# Patient Record
Sex: Male | Born: 1965 | Race: White | Hispanic: No | Marital: Married | Smoking: Never smoker
Health system: Southern US, Community
[De-identification: ages and names within clinical notes are randomized; demographics above are authoritative.]

## PROBLEM LIST (undated history)

## (undated) DIAGNOSIS — E8881 Metabolic syndrome: Secondary | ICD-10-CM

## (undated) DIAGNOSIS — E785 Hyperlipidemia, unspecified: Secondary | ICD-10-CM

## (undated) DIAGNOSIS — M7050 Other bursitis of knee, unspecified knee: Secondary | ICD-10-CM

## (undated) DIAGNOSIS — I10 Essential (primary) hypertension: Secondary | ICD-10-CM

## (undated) HISTORY — DX: Metabolic syndrome: E88.81

## (undated) HISTORY — DX: Hyperlipidemia, unspecified: E78.5

## (undated) HISTORY — PX: APPENDECTOMY: SHX54

## (undated) HISTORY — DX: Essential (primary) hypertension: I10

## (undated) HISTORY — DX: Metabolic syndrome: E88.810

## (undated) HISTORY — DX: Other bursitis of knee, unspecified knee: M70.50

---

## 2006-06-11 ENCOUNTER — Ambulatory Visit: Payer: Self-pay | Admitting: Family Medicine

## 2006-06-15 LAB — CONVERTED CEMR LAB
Cholesterol: 232 mg/dL
LDL Cholesterol: 152 mg/dL

## 2006-06-30 ENCOUNTER — Ambulatory Visit: Payer: Self-pay | Admitting: Family Medicine

## 2006-07-21 ENCOUNTER — Ambulatory Visit: Payer: Self-pay | Admitting: Family Medicine

## 2006-08-25 ENCOUNTER — Ambulatory Visit: Payer: Self-pay | Admitting: Family Medicine

## 2006-11-22 ENCOUNTER — Ambulatory Visit: Payer: Self-pay | Admitting: Family Medicine

## 2006-11-22 DIAGNOSIS — I10 Essential (primary) hypertension: Secondary | ICD-10-CM | POA: Insufficient documentation

## 2006-12-08 ENCOUNTER — Ambulatory Visit: Payer: Self-pay | Admitting: Family Medicine

## 2006-12-08 DIAGNOSIS — L919 Hypertrophic disorder of the skin, unspecified: Secondary | ICD-10-CM

## 2006-12-08 DIAGNOSIS — L909 Atrophic disorder of skin, unspecified: Secondary | ICD-10-CM | POA: Insufficient documentation

## 2006-12-08 DIAGNOSIS — Z6835 Body mass index (BMI) 35.0-35.9, adult: Secondary | ICD-10-CM

## 2006-12-08 DIAGNOSIS — E785 Hyperlipidemia, unspecified: Secondary | ICD-10-CM | POA: Insufficient documentation

## 2006-12-08 LAB — CONVERTED CEMR LAB: Cholesterol, target level: 200 mg/dL

## 2006-12-28 ENCOUNTER — Telehealth: Payer: Self-pay | Admitting: Family Medicine

## 2007-01-13 ENCOUNTER — Telehealth: Payer: Self-pay | Admitting: Family Medicine

## 2007-03-07 ENCOUNTER — Ambulatory Visit: Payer: Self-pay | Admitting: Family Medicine

## 2007-03-22 ENCOUNTER — Telehealth: Payer: Self-pay | Admitting: Family Medicine

## 2007-03-28 ENCOUNTER — Encounter: Payer: Self-pay | Admitting: Family Medicine

## 2007-03-30 ENCOUNTER — Encounter: Payer: Self-pay | Admitting: Family Medicine

## 2007-04-04 ENCOUNTER — Ambulatory Visit: Payer: Self-pay | Admitting: Family Medicine

## 2007-04-04 LAB — CONVERTED CEMR LAB
CO2: 26 meq/L (ref 19–32)
Calcium: 9.2 mg/dL (ref 8.4–10.5)
Chloride: 106 meq/L (ref 96–112)
Glucose, Bld: 86 mg/dL (ref 70–99)
HDL: 32 mg/dL — ABNORMAL LOW (ref >39)
LDL Cholesterol: 164 mg/dL — ABNORMAL HIGH (ref 0–99)
Potassium: 4 meq/L (ref 3.5–5.3)
Sodium: 143 meq/L (ref 135–145)
Total CHOL/HDL Ratio: 6.9
VLDL: 26 mg/dL (ref 0–40)

## 2007-06-03 ENCOUNTER — Ambulatory Visit: Payer: Self-pay | Admitting: Family Medicine

## 2007-08-04 ENCOUNTER — Ambulatory Visit: Payer: Self-pay | Admitting: Family Medicine

## 2007-08-04 DIAGNOSIS — E8881 Metabolic syndrome: Secondary | ICD-10-CM | POA: Insufficient documentation

## 2007-08-04 LAB — CONVERTED CEMR LAB: LDL Goal: 130 mg/dL

## 2007-11-02 ENCOUNTER — Ambulatory Visit: Payer: Self-pay | Admitting: Family Medicine

## 2008-03-27 ENCOUNTER — Ambulatory Visit: Payer: Self-pay | Admitting: Family Medicine

## 2008-03-28 ENCOUNTER — Encounter: Payer: Self-pay | Admitting: Family Medicine

## 2008-03-28 LAB — CONVERTED CEMR LAB
ALT: 28 units/L (ref 0–53)
Alkaline Phosphatase: 100 units/L (ref 39–117)
CO2: 25 meq/L (ref 19–32)
Creatinine, Ser: 0.83 mg/dL (ref 0.40–1.50)
HDL: 30 mg/dL — ABNORMAL LOW (ref >39)
Sodium: 142 meq/L (ref 135–145)
Total Bilirubin: 0.7 mg/dL (ref 0.3–1.2)
Total Protein: 7 g/dL (ref 6.0–8.3)

## 2008-04-10 ENCOUNTER — Telehealth: Payer: Self-pay | Admitting: Family Medicine

## 2008-09-25 ENCOUNTER — Ambulatory Visit: Payer: Self-pay | Admitting: Family Medicine

## 2008-09-25 DIAGNOSIS — R5383 Other fatigue: Secondary | ICD-10-CM

## 2008-09-25 DIAGNOSIS — R5381 Other malaise: Secondary | ICD-10-CM | POA: Insufficient documentation

## 2008-09-26 ENCOUNTER — Encounter: Payer: Self-pay | Admitting: Family Medicine

## 2008-09-27 ENCOUNTER — Encounter: Payer: Self-pay | Admitting: Family Medicine

## 2008-09-27 LAB — CONVERTED CEMR LAB
HCT: 45.1 % (ref 39.0–52.0)
Hemoglobin: 14.8 g/dL (ref 13.0–17.0)
MCV: 88.1 fL (ref 78.0–100.0)
Platelets: 235 10*3/uL (ref 150–400)
WBC: 8.9 10*3/uL (ref 4.0–10.5)

## 2008-09-28 ENCOUNTER — Encounter: Payer: Self-pay | Admitting: Family Medicine

## 2008-09-28 ENCOUNTER — Telehealth (INDEPENDENT_AMBULATORY_CARE_PROVIDER_SITE_OTHER): Payer: Self-pay | Admitting: *Deleted

## 2008-10-03 LAB — CONVERTED CEMR LAB
Iron: 103 ug/dL (ref 42–165)
Saturation Ratios: 32 % (ref 20–55)
TIBC: 321 ug/dL (ref 215–435)
UIBC: 218 ug/dL

## 2008-12-27 ENCOUNTER — Encounter: Admission: RE | Admit: 2008-12-27 | Discharge: 2008-12-27 | Payer: Self-pay | Admitting: Family Medicine

## 2008-12-27 ENCOUNTER — Ambulatory Visit: Payer: Self-pay | Admitting: Family Medicine

## 2008-12-27 DIAGNOSIS — M170 Bilateral primary osteoarthritis of knee: Secondary | ICD-10-CM | POA: Insufficient documentation

## 2008-12-27 LAB — CONVERTED CEMR LAB
ALT: 37 units/L (ref 0–53)
AST: 23 units/L (ref 0–37)
Glucose, Bld: 96 mg/dL (ref 70–99)
HDL: 32 mg/dL — ABNORMAL LOW (ref >39)
Sed Rate: 6 mm/hr (ref 0–16)
Uric Acid, Serum: 8.3 mg/dL — ABNORMAL HIGH (ref 4.0–7.8)

## 2008-12-28 ENCOUNTER — Encounter: Payer: Self-pay | Admitting: Family Medicine

## 2008-12-31 ENCOUNTER — Ambulatory Visit: Payer: Self-pay | Admitting: Family Medicine

## 2008-12-31 DIAGNOSIS — IMO0002 Reserved for concepts with insufficient information to code with codable children: Secondary | ICD-10-CM | POA: Insufficient documentation

## 2009-01-03 ENCOUNTER — Ambulatory Visit: Payer: Self-pay | Admitting: Family Medicine

## 2009-01-03 DIAGNOSIS — K5289 Other specified noninfective gastroenteritis and colitis: Secondary | ICD-10-CM | POA: Insufficient documentation

## 2009-05-03 ENCOUNTER — Ambulatory Visit: Payer: Self-pay | Admitting: Family Medicine

## 2009-05-03 DIAGNOSIS — H00019 Hordeolum externum unspecified eye, unspecified eyelid: Secondary | ICD-10-CM | POA: Insufficient documentation

## 2009-05-03 DIAGNOSIS — R609 Edema, unspecified: Secondary | ICD-10-CM | POA: Insufficient documentation

## 2009-06-17 ENCOUNTER — Telehealth (INDEPENDENT_AMBULATORY_CARE_PROVIDER_SITE_OTHER): Payer: Self-pay | Admitting: *Deleted

## 2009-06-21 ENCOUNTER — Ambulatory Visit: Payer: Self-pay | Admitting: Family Medicine

## 2009-06-25 LAB — CONVERTED CEMR LAB
ALT: 35 units/L (ref 0–53)
AST: 24 units/L (ref 0–37)
Alkaline Phosphatase: 110 units/L (ref 39–117)
Creatinine, Ser: 0.92 mg/dL (ref 0.40–1.50)
Sodium: 142 meq/L (ref 135–145)
Total Bilirubin: 0.7 mg/dL (ref 0.3–1.2)
Total Protein: 7.1 g/dL (ref 6.0–8.3)

## 2009-07-19 ENCOUNTER — Telehealth (INDEPENDENT_AMBULATORY_CARE_PROVIDER_SITE_OTHER): Payer: Self-pay | Admitting: *Deleted

## 2009-09-20 ENCOUNTER — Ambulatory Visit: Payer: Self-pay | Admitting: Family Medicine

## 2009-12-26 ENCOUNTER — Ambulatory Visit: Payer: Self-pay | Admitting: Family Medicine

## 2010-01-31 ENCOUNTER — Ambulatory Visit: Payer: Self-pay | Admitting: Family Medicine

## 2010-01-31 DIAGNOSIS — M7710 Lateral epicondylitis, unspecified elbow: Secondary | ICD-10-CM | POA: Insufficient documentation

## 2010-06-27 ENCOUNTER — Ambulatory Visit: Payer: Self-pay | Admitting: Family Medicine

## 2010-06-30 DIAGNOSIS — E538 Deficiency of other specified B group vitamins: Secondary | ICD-10-CM | POA: Insufficient documentation

## 2010-06-30 LAB — CONVERTED CEMR LAB
CO2: 27 meq/L (ref 19–32)
Calcium: 9 mg/dL (ref 8.4–10.5)
Chloride: 105 meq/L (ref 96–112)
Cholesterol: 160 mg/dL (ref 0–200)
Creatinine, Ser: 0.88 mg/dL (ref 0.40–1.50)
Glucose, Bld: 98 mg/dL (ref 70–99)
PSA: 0.76 ng/mL (ref 0.10–4.00)
Total Bilirubin: 0.8 mg/dL (ref 0.3–1.2)
Total CHOL/HDL Ratio: 5
Triglycerides: 180 mg/dL — ABNORMAL HIGH (ref <150)
VLDL: 36 mg/dL (ref 0–40)
Vitamin B-12: 276 pg/mL (ref 211–911)

## 2010-07-01 ENCOUNTER — Ambulatory Visit: Payer: Self-pay | Admitting: Family Medicine

## 2010-07-14 ENCOUNTER — Telehealth: Payer: Self-pay | Admitting: Family Medicine

## 2010-08-01 ENCOUNTER — Ambulatory Visit: Payer: Self-pay | Admitting: Family Medicine

## 2010-09-05 ENCOUNTER — Ambulatory Visit: Payer: Self-pay | Admitting: Family Medicine

## 2010-09-18 ENCOUNTER — Telehealth (INDEPENDENT_AMBULATORY_CARE_PROVIDER_SITE_OTHER): Payer: Self-pay | Admitting: *Deleted

## 2010-09-25 ENCOUNTER — Encounter: Payer: Self-pay | Admitting: Family Medicine

## 2010-09-25 ENCOUNTER — Encounter: Admission: RE | Admit: 2010-09-25 | Discharge: 2010-09-25 | Payer: Self-pay | Admitting: Orthopedic Surgery

## 2010-10-10 ENCOUNTER — Ambulatory Visit: Payer: Self-pay | Admitting: Family Medicine

## 2010-10-24 ENCOUNTER — Ambulatory Visit: Payer: Self-pay | Admitting: Family Medicine

## 2010-11-05 ENCOUNTER — Telehealth: Payer: Self-pay | Admitting: Family Medicine

## 2010-11-07 ENCOUNTER — Ambulatory Visit: Payer: Self-pay | Admitting: Family Medicine

## 2010-12-12 ENCOUNTER — Ambulatory Visit: Payer: Self-pay | Admitting: Family Medicine

## 2011-01-20 NOTE — Assessment & Plan Note (Signed)
Summary: B12 shot  Nurse Visit   Allergies: 1)  ! Pcn  Medication Administration  Injection # 1:    Medication: Vit B12 1000 mcg    Diagnosis: B12 DEFICIENCY (ICD-266.2)    Route: IM    Site: R deltoid    Exp Date: 04/20/2012    Lot #: [id number]    Mfr: [employer]    Patient tolerated injection without complications    Given by: Kathlene November LPN (October 10, 2010 8:35 AM)  Orders Added: 1)  Vit B12 1000 mcg [J3420] 2)  Admin of Therapeutic Inj  intramuscular or subcutaneous [[id number]]

## 2011-01-20 NOTE — Consult Note (Signed)
Summary: [facility]   Imported By: Lanelle Bal 10/08/2010 11:38:35  _____________________________________________________________________  External Attachment:    Type:   Image     Comment:   External Document

## 2011-01-20 NOTE — Assessment & Plan Note (Signed)
Summary: b12 injection-jr  Nurse Visit   Vitals Entered By: Payton Spark CMA (August 01, 2010 9:08 AM)  Allergies: 1)  ! Pcn  Medication Administration  Injection # 1:    Medication: Vit B12 1000 mcg    Diagnosis: B12 DEFICIENCY (ICD-266.2)    Route: IM    Site: LUOQ gluteus    Exp Date: 04/13    Lot #: [id number]    Patient tolerated injection without complications    Given by: Payton Spark CMA (August 01, 2010 9:09 AM)  Orders Added: 1)  Vit B12 1000 mcg [J3420] 2)  Admin of Therapeutic Inj  intramuscular or subcutaneous [96372]   Medication Administration  Injection # 1:    Medication: Vit B12 1000 mcg    Diagnosis: B12 DEFICIENCY (ICD-266.2)    Route: IM    Site: LUOQ gluteus    Exp Date: 04/13    Lot #: [id number]    Patient tolerated injection without complications    Given by: Payton Spark CMA (August 01, 2010 9:09 AM)  Orders Added: 1)  Vit B12 1000 mcg [J3420] 2)  Admin of Therapeutic Inj  intramuscular or subcutaneous [[id number]]

## 2011-01-20 NOTE — Assessment & Plan Note (Signed)
Summary: tennis elbow   Vital Signs:  Patient profile:   45 year old male Height:      70.5 inches Weight:      258 pounds BMI:     36.63 O2 Sat:      98 % on Room air Temp:     98.5 degrees F oral Pulse rate:   76 / minute BP sitting:   160 / 88  (right arm)  Vitals Entered By: Payton Spark CMA (January 31, 2010 9:39 AM)  O2 Flow:  Room air CC: R elbow pain x 1.5 weeks. Worse when cold.    Primary Care Provider:  Seymour Bars D.O.  CC:  R elbow pain x 1.5 weeks. Worse when cold. Brandon Mckee  History of Present Illness: 45 yo HM presents for pain in both elbows.  He had pain in the L side since October that is unchanged.  The R elbow started hurting over a wk ago.  It has been acute pain that is more severe.  No swelling or redness.  He had not been doing much work before this started.  Pain was worse with cold weather.  He took some of his pain pills which helped him sleep.  He is not taking anything OTC.  No other joint pains.  The pain is not radiating.  Worse w/ straightening.  Current Medications (verified): 1)  Aspir-Low 81 Mg Tbec (Aspirin) .... Once Daily By Mouth 2)  Pravastatin Sodium 40 Mg Tabs (Pravastatin Sodium) .... 2 Tabs By Mouth Qhs 3)  Lisinopril-Hydrochlorothiazide 20-25 Mg Tabs (Lisinopril-Hydrochlorothiazide) .Brandon Mckee.. 1 Tab By Mouth Qd 4)  Proair Hfa 108 (90 Base) Mcg/act Aers (Albuterol Sulfate) .Brandon Mckee.. 1-2 Puffs Q4-6 Hrs Prn 5)  Carvedilol 25 Mg Tabs (Carvedilol) .Brandon Mckee.. 1 Tab By Mouth Bid 6)  Niacin Cr 500 Mg  Cpcr (Niacin) .Brandon Mckee.. 1-4 Tabs By Mouth Daily  Allergies (verified): 1)  ! Pcn  Review of Systems      See HPI  Physical Exam  General:  alert, well-developed, well-nourished, and well-hydrated.  obese in NAD Lungs:  Normal respiratory effort, chest expands symmetrically. Lungs are clear to auscultation, no crackles or wheezes. Heart:  Normal rate and regular rhythm. S1 and S2 normal without gallop, murmur, click, rub or other extra sounds. Msk:  no joint  effusions tender at the insertion of muscle tendon over both lateral epicodyles.  Full elbow ROM bilat with R>L pain at full extension.  No redness, bruising or edema noted.   Pulses:  2+ radial pulses Extremities:  no UE or LE edema Skin:  color normal.     Impression & Recommendations:  Problem # 1:  LATERAL EPICONDYLITIS, BILATERAL (ICD-726.32) Exam c/w chronic L lateral epicondylitis and an acute R lateral epicondylitis likely due to repetitive motion (lays floors). Treat with RX NSAIDS, Cho Pat strapping, ice and relative rest x 2 wks.  Vicodin given for severe pain. If not improved in 2 wks will send to ortho to see if injection is needed.  Explained risk for tendon rupture.  Problem # 2:  HYPERTENSION, BENIGN (ICD-401.1) BP high today likely due to pain.  Recheck at f/u visit to ensure it comes back down. His updated medication list for this problem includes:    Lisinopril-hydrochlorothiazide 20-25 Mg Tabs (Lisinopril-hydrochlorothiazide) .Brandon Mckee... 1 tab by mouth qd    Carvedilol 25 Mg Tabs (Carvedilol) .Brandon Mckee... 1 tab by mouth bid  BP today: 160/88 Prior BP: 140/80 (12/26/2009)  Prior 10 Yr Risk Heart Disease: 9 % (03/27/2008)  Labs Reviewed: K+: 3.9 (06/21/2009) Creat: : 0.92 (06/21/2009)   Chol: 177 (12/27/2008)   HDL: 32 (12/27/2008)   LDL: 116 (12/27/2008)   TG: 144 (12/27/2008)  Complete Medication List: 1)  Aspir-low 81 Mg Tbec (Aspirin) .... Once daily by mouth 2)  Pravastatin Sodium 40 Mg Tabs (Pravastatin sodium) .... 2 tabs by mouth qhs 3)  Lisinopril-hydrochlorothiazide 20-25 Mg Tabs (Lisinopril-hydrochlorothiazide) .Brandon Mckee.. 1 tab by mouth qd 4)  Proair Hfa 108 (90 Base) Mcg/act Aers (Albuterol sulfate) .Brandon Mckee.. 1-2 puffs q4-6 hrs prn 5)  Carvedilol 25 Mg Tabs (Carvedilol) .Brandon Mckee.. 1 tab by mouth bid 6)  Niacin Cr 500 Mg Cpcr (Niacin) .Brandon Mckee.. 1-4 tabs by mouth daily 7)  Vicodin 5-500 Mg Tabs (Hydrocodone-acetaminophen) .Brandon Mckee.. 1 tab by mouth three times a day as needed severe pain, take  with food 8)  Naprosyn 500 Mg Tabs (Naproxen) .Brandon Mckee.. 1 tab by mouth two times a day x 10 days, take with food  Patient Instructions: 1)  Take Naprosyn with breakfast and dinner for the next 10 days as your anti inflammatory. 2)  Use Vicodin as needed for severe pain.  It may cause drowsiness. 3)  Ice the elbows for 10 min 4 x a day. 4)  Avoid over use. 5)  Use an OTC Cho Pat strap (tennis elbow strap on both). 6)  Call if not improving in 2 wks. Prescriptions: NAPROSYN 500 MG TABS (NAPROXEN) 1 tab by mouth two times a day x 10 days, take with food  #20 x 0   Entered and Authorized by:   Seymour Bars DO   Signed by:   Seymour Bars DO on 01/31/2010   Method used:   Printed then faxed to ...       [facility] [phone]* (retail)       [address] [place], [place]  [address]       Ph: [phone]       Fax: [phone]   RxID:   [id number] VICODIN 5-500 MG TABS (HYDROCODONE-ACETAMINOPHEN) 1 tab by mouth three times a day as needed severe pain, take with food  #30 x 0   Entered and Authorized by:   Seymour Bars DO   Signed by:   Seymour Bars DO on 01/31/2010   Method used:   Printed then faxed to ...       [facility] [phone]* (retail)       [address] [place], [place]  [address]       Ph: [phone]       Fax: [phone]   RxID:   [id number]

## 2011-01-20 NOTE — Assessment & Plan Note (Signed)
Summary: CPE   Vital Signs:  Patient profile:   45 year old male Height:      70.5 inches Weight:      260 pounds BMI:     36.91 O2 Sat:      97 % on Room air Pulse rate:   68 / minute BP sitting:   139 / 84  (left arm) Cuff size:   large  Vitals Entered By: Payton Spark CMA (June 27, 2010 8:34 AM)  O2 Flow:  Room air CC: F/U fasting labs.   Primary Care Provider:  Seymour Bars D.O.  CC:  F/U fasting labs..  History of Present Illness: 45 yo M presents for CPE.  He is an obese male with hx of HTN and dyslipidemia.  Denies any CP or DOE.  Has failed to lose any wt.  Gets little exercise and eats a fairly poor diet.  He has no insurance and has put off stress testing and a  sleep study.  He is due for fasting labs.  Denies fam hx of colon or prostate cancer or of premature heart dz.  Has a fam hx of pernicous anemia.  Current Medications (verified): 1)  Aspir-Low 81 Mg Tbec (Aspirin) .... Once Daily By Mouth 2)  Pravastatin Sodium 40 Mg Tabs (Pravastatin Sodium) .... 2 Tabs By Mouth Qhs 3)  Lisinopril-Hydrochlorothiazide 20-25 Mg Tabs (Lisinopril-Hydrochlorothiazide) .[name].. 1 Tab By Mouth Qd 4)  Proair Hfa 108 (90 Base) Mcg/act Aers (Albuterol Sulfate) .[name].. 1-2 Puffs Q4-6 Hrs Prn 5)  Carvedilol 25 Mg Tabs (Carvedilol) .[name].. 1 Tab By Mouth Bid 6)  Niacin Cr 500 Mg  Cpcr (Niacin) .[name].. 1-4 Tabs By Mouth Daily  Allergies (verified): 1)  ! Pcn  Past History:  Past Medical History: Reviewed history from 12/26/2009 and no changes required. hyperlipidemia metabolic syndrome/ IFG HTN pes anserine bursitis bilat  Past Surgical History: Reviewed history from 09/28/2006 and no changes required. Appendectomy  Family History: Reviewed history from 11/22/2006 and no changes required. father HTN, high chol, mother HTN/high chol  sister obese, HTN  Social History: Reviewed history from 11/22/2006 and no changes required. Floor covering- self-employed.  Married to [place] and  has 3 kids.  Nonsmoker.  Drinks 10 caffeinated drinks/ day.  Does not regularly exercise.  Likes to [employer]. Does not have insurance.  Review of Systems  The patient denies anorexia, fever, weight loss, weight gain, vision loss, decreased hearing, hoarseness, chest pain, syncope, dyspnea on exertion, peripheral edema, prolonged cough, headaches, hemoptysis, abdominal pain, melena, hematochezia, severe indigestion/heartburn, hematuria, incontinence, genital sores, muscle weakness, suspicious skin lesions, transient blindness, difficulty walking, depression, unusual weight change, abnormal bleeding, enlarged lymph nodes, angioedema, breast masses, and testicular masses.    Physical Exam  General:  alert, well-developed, well-nourished, and well-hydrated.  obese Head:  normocephalic and atraumatic.   Eyes:  pupils equal, pupils round, and pupils reactive to light.   Ears:  EACs patent; TMs translucent and gray with good cone of light and bony landmarks.  Nose:  no nasal discharge.   Mouth:  good dentition and pharynx pink and moist.   Neck:  no masses.   Lungs:  Normal respiratory effort, chest expands symmetrically. Lungs are clear to auscultation, no crackles or wheezes. Heart:  Normal rate and regular rhythm. S1 and S2 normal without gallop, murmur, click, rub or other extra sounds. Abdomen:  soft, non-tender, normal bowel sounds, no distention, no masses, no guarding, no hepatomegaly, and no splenomegaly.  no AA bruits Pulses:  2+ radial and pedal pulses Extremities:  no LE edema Skin:  color normal.   Cervical Nodes:  No lymphadenopathy noted Psych:  good eye contact, not anxious appearing, and not depressed appearing.     Impression & Recommendations:  Problem # 1:  HEALTH MAINTENANCE EXAM (ICD-V70.0) Keeping healthy checklist for men reviewed. BP just barely at goal.  BMI 36 c/w class II obesity. Update fasting labs.  Tdap is UTD. PSA annual ([place] descent). Plan to sleep  study and stress testing this year -- cost an issue. RFd meds. Work on [employer], exercise, wt loss.  Complete Medication List: 1)  Aspir-low 81 Mg Tbec (Aspirin) .... Once daily by mouth 2)  Pravastatin Sodium 40 Mg Tabs (Pravastatin sodium) .... 2 tabs by mouth qhs 3)  Lisinopril-hydrochlorothiazide 20-25 Mg Tabs (Lisinopril-hydrochlorothiazide) .[name].. 1 tab by mouth qd 4)  Proair Hfa 108 (90 Base) Mcg/act Aers (Albuterol sulfate) .[name].. 1-2 puffs q4-6 hrs prn 5)  Carvedilol 25 Mg Tabs (Carvedilol) .[name].. 1 tab by mouth bid 6)  Niacin Cr 500 Mg Cpcr (Niacin) .[name].. 1-4 tabs by mouth daily  Other Orders: T-Comprehensive Metabolic Panel [id number]) T-Lipid Profile [id number]) T-PSA [phone]) T-TSH [id number]) T-Vitamin B12 ([id number])  Patient Instructions: 1)  Stay on current meds.  RFs done. 2)  Update labs today. 3)  Will call you w/ results on Monday. 4)  Work on [employer], exercise, wt loss. 5)  Return for f/u HTN in 4 mos. Prescriptions: CARVEDILOL 25 MG TABS (CARVEDILOL) 1 tab by mouth bid  #60 Tablet x 6   Entered and Authorized by:   Seymour Bars DO   Signed by:   Seymour Bars DO on 06/27/2010   Method used:   Printed then faxed to ...       [facility] [phone]* (retail)       [address] [place], [place]  [address]       Ph: [phone]       Fax: [phone]   RxID:   [id number] LISINOPRIL-HYDROCHLOROTHIAZIDE 20-25 MG TABS (LISINOPRIL-HYDROCHLOROTHIAZIDE) 1 tab by mouth qd  #30 Tablet x 6   Entered and Authorized by:   Seymour Bars DO   Signed by:   Seymour Bars DO on 06/27/2010   Method used:   Printed then faxed to ...       [facility] [phone]* (retail)       [address] [place], [place]  [address]       Ph: [phone]       Fax: [phone]   RxID:   [id number] PRAVASTATIN SODIUM 40 MG TABS (PRAVASTATIN SODIUM) 2 tabs by mouth qhs  #60 x 6   Entered and Authorized by:   Seymour Bars DO   Signed by:   Seymour Bars DO on 06/27/2010   Method used:   Printed then faxed to ...       [facility] [phone]* (retail)       [address] [place], [place]  [address]       Ph: [phone]       Fax: [phone]   RxID:   [id number]

## 2011-01-20 NOTE — Progress Notes (Signed)
Summary: Elbow pain  Phone Note Call from Patient   Caller: Spouse Summary of Call: Pt's spouse would like to know if you ever received any samples of ? cream to rub on elbow. If not, Pt would like to know what cheaper alternatives you have to doing injections. Please advise. Initial call taken by: Payton Spark CMA,  July 14, 2010 11:03 AM  Follow-up for Phone Call        I can put him on pain meds. We do not have Voltaren Gel.   Follow-up by: Seymour Bars DO,  July 14, 2010 11:08 AM  Additional Follow-up for Phone Call Additional follow up Details #1::        Pt would like something called in for pain. Pt is wearing brace for tennis elbow which helps some during the day but once it's off he is in pain.  Additional Follow-up by: Payton Spark CMA,  July 14, 2010 11:40 AM    Additional Follow-up for Phone Call Additional follow up Details #2::    Alternate OTC Aleve - 2 tabs by mouth two times a day with breakfast and dinner with Rx Tramadol. If not improved in 10 days, call and I will set him up with sports medicine downstairs. Follow-up by: Seymour Bars DO,  July 14, 2010 11:54 AM  New/Updated Medications: TRAMADOL HCL 50 MG TABS (TRAMADOL HCL) 1 tab by mouth two times a day as needed pain Prescriptions: TRAMADOL HCL 50 MG TABS (TRAMADOL HCL) 1 tab by mouth two times a day as needed pain  #60 x 0   Entered and Authorized by:   Seymour Bars DO   Signed by:   Seymour Bars DO on 07/14/2010   Method used:   Electronically to        [facility] [phone]* (retail)       [address]       [place], [place]  [address]       Ph: [phone]       Fax: [phone]   RxID:   [id number]   Appended Document: Elbow pain Pt's wife aware

## 2011-01-20 NOTE — Progress Notes (Signed)
Summary: ED  Phone Note Call from Patient   Caller: Patient Summary of Call: Pt would like to know if you recommend  OTC or Rx med for ED. Please advise. Initial call taken by: Payton Spark CMA,  November 05, 2010 2:27 PM  Follow-up for Phone Call        can try Viagra or Cialis but they are expensive. Follow-up by: Seymour Bars DO,  November 05, 2010 3:48 PM  Additional Follow-up for Phone Call Additional follow up Details #1::        [facility] for [facility] Additional Follow-up by: Payton Spark CMA,  November 06, 2010 11:30 AM

## 2011-01-20 NOTE — Assessment & Plan Note (Signed)
Summary: f/u HTN/ B12   Vital Signs:  Patient profile:   45 year old male Height:      70.5 inches Weight:      256 pounds BMI:     36.34 O2 Sat:      98 % on Room air Pulse rate:   69 / minute BP sitting:   152 / 98  (left arm) Cuff size:   large  Vitals Entered By: Payton Spark CMA (October 24, 2010 8:09 AM)  O2 Flow:  Room air CC: F/U HTN, Hypertension Management   CC:  F/U HTN and Hypertension Management.  Hypertension History:      He denies headache, chest pain, palpitations, dyspnea with exertion, peripheral edema, visual symptoms, neurologic problems, syncope, and side effects from treatment.  He notes no problems with any antihypertensive medication side effects.  he does forget his meds from time to time.  he did not take his nighttime pills last night.        Positive major cardiovascular risk factors include hyperlipidemia and hypertension.  Negative major cardiovascular risk factors include male age less than 81 years old, no history of diabetes, negative family history for ischemic heart disease, and non-tobacco-user status.        Further assessment for target organ damage reveals no history of ASHD, cardiac end-organ damage (CHF/LVH), stroke/TIA, peripheral vascular disease, renal insufficiency, or hypertensive retinopathy.     Current Medications (verified): 1)  Aspir-Low 81 Mg Tbec (Aspirin) .... Once Daily By Mouth 2)  Pravastatin Sodium 40 Mg Tabs (Pravastatin Sodium) .... 2 Tabs By Mouth Qhs 3)  Lisinopril-Hydrochlorothiazide 20-25 Mg Tabs (Lisinopril-Hydrochlorothiazide) .[name].. 1 Tab By Mouth Qd 4)  Proair Hfa 108 (90 Base) Mcg/act Aers (Albuterol Sulfate) .[name].. 1-2 Puffs Q4-6 Hrs Prn 5)  Carvedilol 25 Mg Tabs (Carvedilol) .[name].. 1 Tab By Mouth Bid 6)  Niacin Cr 500 Mg  Cpcr (Niacin) .[name].. 1-4 Tabs By Mouth Daily 7)  Tramadol Hcl 50 Mg Tabs (Tramadol Hcl) .[name].. 1 Tab By Mouth Two Times A Day As Needed Pain  Allergies (verified): 1)  ! Pcn  Past  History:  Past Medical History: Reviewed history from 12/26/2009 and no changes required. hyperlipidemia metabolic syndrome/ IFG HTN pes anserine bursitis bilat  Past Surgical History: Reviewed history from 09/28/2006 and no changes required. Appendectomy  Social History: Reviewed history from 11/22/2006 and no changes required. 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  Nonsmoker.  Drinks 10 caffeinated drinks/ day.  Does not regularly exercise.  Likes to [employer]. Does not have insurance.  Review of Systems      See HPI  Physical Exam  General:  alert, well-developed, well-nourished, and well-hydrated.  obese Head:  normocephalic and atraumatic.   Mouth:  pharynx pink and moist.   Neck:  no masses.   Lungs:  Normal respiratory effort, chest expands symmetrically. Lungs are clear to auscultation, no crackles or wheezes. Heart:  Normal rate and regular rhythm. S1 and S2 normal without gallop, murmur, click, rub or other extra sounds. Skin:  color normal.     Impression & Recommendations:  Problem # 1:  HYPERTENSION, BENIGN (ICD-401.1) Assessment Deteriorated BP high today b/c he is not taking his meds.  We had a talk about the importance of medication compliance today. He also needs to keep working on his diet -- cutting back on high use of soda and wt loss as part of his treatment.  BMI 36 = class II obesity range.  His updated medication list for this problem includes:    Lisinopril-hydrochlorothiazide 20-25 Mg Tabs (Lisinopril-hydrochlorothiazide) .[name]... 1 tab by mouth qd    Carvedilol 25 Mg Tabs (Carvedilol) .[name]... 1 tab by mouth bid  BP today: 152/98 Prior BP: 139/84 (06/27/2010)  10 Yr Risk Heart Disease: 6 % Prior 10 Yr Risk Heart Disease: 9 % (03/27/2008)  Labs Reviewed: K+: 3.9 (06/27/2010) Creat: : 0.88 (06/27/2010)   Chol: 160 (06/27/2010)   HDL: 32 (06/27/2010)   LDL: 92 (06/27/2010)   TG: 180 (06/27/2010)  Problem # 2:  B12  DEFICIENCY (ICD-266.2) On B12 shots monthly.  Due for repeat lab in FEB.  Complete Medication List: 1)  Aspir-low 81 Mg Tbec (Aspirin) .... Once daily by mouth 2)  Pravastatin Sodium 40 Mg Tabs (Pravastatin sodium) .... 2 tabs by mouth qhs 3)  Lisinopril-hydrochlorothiazide 20-25 Mg Tabs (Lisinopril-hydrochlorothiazide) .[name].. 1 tab by mouth qd 4)  Proair Hfa 108 (90 Base) Mcg/act Aers (Albuterol sulfate) .[name].. 1-2 puffs q4-6 hrs prn 5)  Carvedilol 25 Mg Tabs (Carvedilol) .[name].. 1 tab by mouth bid 6)  Niacin Cr 500 Mg Cpcr (Niacin) .[name].. 1-4 tabs by mouth daily 7)  Tramadol Hcl 50 Mg Tabs (Tramadol hcl) .[name].. 1 tab by mouth two times a day as needed pain  Hypertension Assessment/Plan:      The patient's hypertensive risk group is category B: At least one risk factor (excluding diabetes) with no target organ damage.  His calculated 10 year risk of coronary heart disease is 6 %.  Today's blood pressure is 152/98.  His blood pressure goal is < 140/90.  Patient Instructions: 1)  Return for next B12 shot/ BP check in 2 wks. 2)  4 lbs weight lost -- a good start! 3)  Let's cut back on soda consumption to 1-2 per day. 4)  Remember to take your medications! 5)  Return for f/u visit in 4 mos.  Will recheck B12 level then.   Orders Added: 1)  Est. Patient Level III [16109]

## 2011-01-20 NOTE — Assessment & Plan Note (Signed)
Summary: B12/ Flu shot  Nurse Visit   Vitals Entered By: Payton Spark CMA (September 05, 2010 8:54 AM)  Allergies: 1)  ! Pcn  Medication Administration  Injection # 1:    Medication: Vit B12 1000 mcg    Diagnosis: B12 DEFICIENCY (ICD-266.2)    Route: IM    Site: R deltoid    Exp Date: 04/2012    Lot #: [id number]    Patient tolerated injection without complications    Given by: Payton Spark CMA (September 05, 2010 8:55 AM)  Orders Added: 1)  Vit B12 1000 mcg [J3420] 2)  Admin of Therapeutic Inj  intramuscular or subcutaneous [96372] 3)  Admin 1st Vaccine [90471] 4)  Flu Vaccine 41yrs + [78295]   Medication Administration  Injection # 1:    Medication: Vit B12 1000 mcg    Diagnosis: B12 DEFICIENCY (ICD-266.2)    Route: IM    Site: R deltoid    Exp Date: 04/2012    Lot #: [id number]    Patient tolerated injection without complications    Given by: Payton Spark CMA (September 05, 2010 8:55 AM)  Orders Added: 1)  Vit B12 1000 mcg [J3420] 2)  Admin of Therapeutic Inj  intramuscular or subcutaneous [96372] 3)  Admin 1st Vaccine [90471] 4)  Flu Vaccine 71yrs + [57846] Flu Vaccine Consent Questions     Do you have a history of severe allergic reactions to this vaccine? no    Any prior history of allergic reactions to egg and/or gelatin? no    Do you have a sensitivity to the preservative Thimersol? no    Do you have a past history of Guillan-Barre Syndrome? no    Do you currently have an acute febrile illness? no    Have you ever had a severe reaction to latex? no    Vaccine information given and explained to patient? yes    Are you currently pregnant? no    Lot Number:[id number]   Exp Date:06/20/2011   Site Given  Left Deltoid IMmin of Therapeutic Inj  intramuscular or subcutaneous [96372] 3)  Admin 1st Vaccine [90471] 4)  Flu Vaccine 35yrs + [96295]    .lbflu

## 2011-01-20 NOTE — Progress Notes (Signed)
Summary: Still having elbow pain  Phone Note Call from Patient   Caller: Spouse Summary of Call: Wife LMOM stating Pt is taking meds and wearing brace as directed but Pt is still having elbow pain. Pt would like to know what we should do from here. Please advise. Initial call taken by: Payton Spark CMA,  September 18, 2010 2:35 PM  Follow-up for Phone Call        Let's get him set up to see sports med downstairs.  Does he want to RF the Tramadol for pain? Follow-up by: Seymour Bars DO,  September 19, 2010 8:22 AM  Additional Follow-up for Phone Call Additional follow up Details #1::        Pt will call if needs refill before ortho apt. Additional Follow-up by: Payton Spark CMA,  September 19, 2010 8:38 AM

## 2011-01-20 NOTE — Assessment & Plan Note (Signed)
Summary: f/u HTN   Vital Signs:  Patient profile:   45 year old male Height:      70.5 inches Weight:      256 pounds BMI:     36.Brandon O2 Sat:      96 % on Room air Temp:     98.3 degrees F oral Pulse rate:   55 / minute BP sitting:   140 / 80  (left arm) Cuff size:   large  Vitals Entered By: Payton Spark CMA (December 26, 2009 3:17 PM)  O2 Flow:  Room air CC: F/U BP   Primary Care Provider:  Seymour Bars D.O.  CC:  F/U BP.  History of Present Illness: Brandon Mckee presents for f/u HTN.  We increased his Carvedilol to 25 mg two times a day.   He has been busy building a new house.  He lost 9 lbs.  He is feeling better.  He is bowling 3 x a wk.  His pes anseurine bursitis has not really been bothering him.  He has not needed any pain meds.  Labs are UTD. Denies CP or DOE.  Labs are not due until July.    Current Medications (verified): 1)  Aspir-Low 81 Mg Tbec (Aspirin) .... Once Daily By Mouth 2)  Pravastatin Sodium 40 Mg Tabs (Pravastatin Sodium) .... 2 Tabs By Mouth Qhs 3)  Lisinopril-Hydrochlorothiazide 20-25 Mg Tabs (Lisinopril-Hydrochlorothiazide) .[name].. 1 Tab By Mouth Qd 4)  Proair Hfa 108 (90 Base) Mcg/act Aers (Albuterol Sulfate) .[name].. 1-2 Puffs Q4-6 Hrs Prn 5)  Carvedilol 25 Mg Tabs (Carvedilol) .[name].. 1 Tab By Mouth Bid 6)  Niacin Cr 500 Mg  Cpcr (Niacin) .[name].. 1-4 Tabs By Mouth Daily 7)  Meloxicam 7.5 Mg Tabs (Meloxicam) .[name].. 1-2 Tabs By Mouth Daily For Knee Pain  Allergies (verified): 1)  ! Pcn  Past History:  Past Medical History: hyperlipidemia metabolic syndrome/ IFG HTN pes anserine bursitis bilat  Social History: Reviewed history from 11/22/2006 and no changes required. Floor covering- self-employed.  Married to [place] and has 3 kids.  Nonsmoker.  Drinks 10 caffeinated drinks/ day.  Does not regularly exercise.  Likes to [employer]. Does not have insurance.  Review of Systems      See HPI  Physical Exam  General:  alert, well-developed, well-nourished,  and well-hydrated.  obese Eyes:  pupils equal, pupils round, and pupils reactive to light.   Neck:  no masses.  short thick neck Lungs:  Normal respiratory effort, chest expands symmetrically. Lungs are clear to auscultation, no crackles or wheezes. Heart:  Normal rate and regular rhythm. S1 and S2 normal without gallop, murmur, click, rub or other extra sounds. Msk:  normal gait.  full knee ROM Extremities:  trace bilat LE edema Skin:  color normal.   Psych:  good eye contact, not anxious appearing, and not depressed appearing.     Impression & Recommendations:  Problem # 1:  HYPERTENSION, BENIGN (ICD-401.1) Assessment Improved BP improved with increasing carvediolol with just mild asymptomatic sinus bradycardia now. Update CPE/ labs in July.  RFd meds.  Congratulated him on 9 lbs wt loss -- a great start! His updated medication list for this problem includes:    Lisinopril-hydrochlorothiazide 20-25 Mg Tabs (Lisinopril-hydrochlorothiazide) .[name]... 1 tab by mouth qd    Carvedilol 25 Mg Tabs (Carvedilol) .[name]... 1 tab by mouth bid  BP today: 140/80 Prior BP: 161/103 (09/20/2009)  Prior 10 Yr Risk Heart Disease: 9 % (03/27/2008)  Labs Reviewed: K+: 3.9 (06/21/2009)  Creat: : 0.92 (06/21/2009)   Chol: 177 (12/27/2008)   HDL: 32 (12/27/2008)   LDL: 116 (12/27/2008)   TG: 144 (12/27/2008)  Complete Medication List: 1)  Aspir-low 81 Mg Tbec (Aspirin) .... Once daily by mouth 2)  Pravastatin Sodium 40 Mg Tabs (Pravastatin sodium) .... 2 tabs by mouth qhs 3)  Lisinopril-hydrochlorothiazide 20-25 Mg Tabs (Lisinopril-hydrochlorothiazide) .[name].. 1 tab by mouth qd 4)  Proair Hfa 108 (90 Base) Mcg/act Aers (Albuterol sulfate) .[name].. 1-2 puffs q4-6 hrs prn 5)  Carvedilol 25 Mg Tabs (Carvedilol) .[name].. 1 tab by mouth bid 6)  Niacin Cr 500 Mg Cpcr (Niacin) .[name].. 1-4 tabs by mouth daily  Patient Instructions: 1)  Stay on current meds. 2)  Return for a physical with labs in 6  months. Prescriptions: CARVEDILOL 25 MG TABS (CARVEDILOL) 1 tab by mouth bid  #60 x 3   Entered and Authorized by:   Seymour Bars DO   Signed by:   Seymour Bars DO on 12/26/2009   Method used:   Print then Give to Patient   RxID:   [id number] LISINOPRIL-HYDROCHLOROTHIAZIDE 20-25 MG TABS (LISINOPRIL-HYDROCHLOROTHIAZIDE) 1 tab by mouth qd  #30 Tablet x 3   Entered and Authorized by:   Seymour Bars DO   Signed by:   Seymour Bars DO on 12/26/2009   Method used:   Print then Give to Patient   RxID:   [id number] PRAVASTATIN SODIUM 40 MG TABS (PRAVASTATIN SODIUM) 2 tabs by mouth qhs  #60 x 3   Entered and Authorized by:   Seymour Bars DO   Signed by:   Seymour Bars DO on 12/26/2009   Method used:   Print then Give to Patient   RxID:   [id number]

## 2011-01-20 NOTE — Assessment & Plan Note (Signed)
Summary: INJECTION  Nurse Visit   Vital Signs:  Patient profile:   45 year old male Pulse rate:   69 / minute BP sitting:   139 / 82  Vitals Entered By: Kathlene November LPN (November 07, 2010 8:42 AM)   Allergies: 1)  ! Pcn  Medication Administration  Injection # 1:    Medication: Vit B12 1000 mcg    Diagnosis: B12 DEFICIENCY (ICD-266.2)    Route: IM    Site: R deltoid    Exp Date: 08/21/2012    Lot #: [id number]    Mfr: [employer]    Patient tolerated injection without complications    Given by: Kathlene November LPN (November 07, 2010 8:43 AM)  Orders Added: 1)  Vit B12 1000 mcg [J3420] 2)  Admin of Therapeutic Inj  intramuscular or subcutaneous [69629]

## 2011-01-20 NOTE — Assessment & Plan Note (Signed)
Summary: B12 INJ//VGJ  Nurse Visit   Vitals Entered By: Payton Spark CMA (July 01, 2010 1:47 PM)  Allergies: 1)  ! Pcn  Medication Administration  Injection # 1:    Medication: Vit B12 1000 mcg    Diagnosis: B12 DEFICIENCY (ICD-266.2)    Route: IM    Site: R deltoid    Exp Date: 03/21/2012    Lot #: [id number]    Patient tolerated injection without complications    Given by: Payton Spark CMA (July 01, 2010 1:47 PM)  Orders Added: 1)  Vit B12 1000 mcg [J3420] 2)  Admin of Therapeutic Inj  intramuscular or subcutaneous [[id number]]   Medication Administration  Injection # 1:    Medication: Vit B12 1000 mcg    Diagnosis: B12 DEFICIENCY (ICD-266.2)    Route: IM    Site: R deltoid    Exp Date: 03/21/2012    Lot #: [id number]    Patient tolerated injection without complications    Given by: Payton Spark CMA (July 01, 2010 1:47 PM)  Orders Added: 1)  Vit B12 1000 mcg [J3420] 2)  Admin of Therapeutic Inj  intramuscular or subcutaneous [[id number]]

## 2011-01-22 NOTE — Assessment & Plan Note (Signed)
Summary: nurse- B12 - jr  Nurse Visit   Allergies: 1)  ! Pcn  Medication Administration  Injection # 1:    Medication: Vit B12 1000 mcg    Diagnosis: B12 DEFICIENCY (ICD-266.2)    Route: IM    Site: L deltoid    Exp Date: 09/27/2012    Lot #: [id number]    Mfr: [employer]    Patient tolerated injection without complications    Given by: Avon Gully CMA, Duncan Dull) (December 12, 2010 8:31 AM)  Orders Added: 1)  Vit B12 1000 mcg [J3420] 2)  Admin of Therapeutic Inj  intramuscular or subcutaneous [[id number]]

## 2011-02-27 ENCOUNTER — Ambulatory Visit (INDEPENDENT_AMBULATORY_CARE_PROVIDER_SITE_OTHER): Payer: Self-pay | Admitting: Family Medicine

## 2011-02-27 ENCOUNTER — Encounter: Payer: Self-pay | Admitting: Family Medicine

## 2011-02-27 DIAGNOSIS — E785 Hyperlipidemia, unspecified: Secondary | ICD-10-CM

## 2011-02-27 DIAGNOSIS — I10 Essential (primary) hypertension: Secondary | ICD-10-CM

## 2011-02-27 DIAGNOSIS — E538 Deficiency of other specified B group vitamins: Secondary | ICD-10-CM

## 2011-02-27 LAB — CONVERTED CEMR LAB
Blood in Urine, dipstick: NEGATIVE
Nitrite: NEGATIVE
Specific Gravity, Urine: >=1.03
Urobilinogen, UA: 0.2

## 2011-03-03 NOTE — Assessment & Plan Note (Signed)
Summary: f/u BP   Vital Signs:  Patient profile:   45 year old male Height:      70.5 inches Weight:      241 pounds BMI:     34.21 O2 Sat:      97 % on Room air Pulse rate:   57 / minute BP sitting:   145 / 86  (left arm) Cuff size:   large  Vitals Entered By: Payton Spark CMA (February 27, 2011 8:14 AM)  O2 Flow:  Room air CC: F/U BP   Primary Care Provider:  Seymour Bars D.O.  CC:  F/U BP.  History of Present Illness: 45 year old male presents  He takes his blood pressure at home and it is normally 130s/70s-low80s.  He states he is taking his carvedilol and Lisinopril-HCTZ.  He denies chest pains, dyspnea, changes in vision, headaches, or lower extremity edema.  He states his weight loss is due to a significant stress in his life and lack of sleep as his wife was in the hospital for almost two months and got out a few weeks ago.  He states he is trying to cut out some of the junk food and fast food as he realizes he was eating too much of it.  Current Medications (verified): 1)  Aspir-Low 81 Mg Tbec (Aspirin) .... Once Daily By Mouth 2)  Pravastatin Sodium 40 Mg Tabs (Pravastatin Sodium) .... 2 Tabs By Mouth Qhs 3)  Lisinopril-Hydrochlorothiazide 20-25 Mg Tabs (Lisinopril-Hydrochlorothiazide) .[name].. 1 Tab By Mouth Qd 4)  Proair Hfa 108 (90 Base) Mcg/act Aers (Albuterol Sulfate) .[name].. 1-2 Puffs Q4-6 Hrs Prn 5)  Carvedilol 25 Mg Tabs (Carvedilol) .[name].. 1 Tab By Mouth Bid 6)  Niacin Cr 500 Mg  Cpcr (Niacin) .[name].. 1-4 Tabs By Mouth Daily 7)  Tramadol Hcl 50 Mg Tabs (Tramadol Hcl) .[name].. 1 Tab By Mouth Two Times A Day As Needed Pain  Allergies (verified): 1)  ! Pcn  Past History:  Past Medical History: Reviewed history from 12/26/2009 and no changes required. hyperlipidemia metabolic syndrome/ IFG HTN pes anserine bursitis bilat  Family History: Reviewed history from 11/22/2006 and no changes required. father HTN, high chol, mother HTN/high chol  sister obese, HTN  Social  History: Reviewed history from 11/22/2006 and no changes required. 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  Nonsmoker.  Drinks 10 caffeinated drinks/ day.  Does not regularly exercise.  Likes to [employer]. Does not have insurance.  Review of Systems      See HPI  Physical Exam  General:  alert, well-developed, well-nourished, and overweight-appearing.   Head:  normocephalic and atraumatic.   Eyes:  pupils equal, pupils round, and pupils reactive to light.  Conjunctiva clear. Nose:  no external erythema and no nasal discharge.   Mouth:  Oropharynx pink and moist without exudate. Neck:  short thick neck circumf. Lungs:  normal respiratory effort, normal breath sounds, no crackles, and no wheezes.   Heart:  normal rate and regular rhythm.   Pulses:  R radial normal and L radial normal.   Extremities:  Trace lower extremity edema. Skin:  color normal.   Psych:  good eye contact, not anxious appearing, and not depressed appearing.     Impression & Recommendations:  Problem # 1:  HYPERTENSION, BENIGN (ICD-401.1)  Patient's BP today 145/86 and BP at home 130s/70s-80s though has not taken it lately.  Repeat was 146/95 in office today.  UA revealed 100mg /dL of protein which  will be worth following in the future given his blood pressure readings today.  I will not change his medications at this time as he does not have insurance but will have him monitor his BP at home.  He is due for labs in four months.  His updated medication list for this problem includes:    Lisinopril-hydrochlorothiazide 20-25 Mg Tabs (Lisinopril-hydrochlorothiazide) .[name]... 1 tab by mouth qd    Carvedilol 25 Mg Tabs (Carvedilol) .[name]... 1 tab by mouth bid  Orders: UA Dipstick w/o Micro (automated)  (81003)  Problem # 2:  B12 DEFICIENCY (ICD-266.2) He has missed his B12 shots in the past and was due for a B12 level in February.  Instead, he will take B-complex OTC vitamins until he comes back in  four months for his physical when we will do a B12 level with the rest of his labs.  Problem # 3:  HYPERLIPIDEMIA (ICD-272.4) Labs UTD.  Continue current meds.   His updated medication list for this problem includes:    Pravastatin Sodium 40 Mg Tabs (Pravastatin sodium) .[name]... 2 tabs by mouth qhs    Niacin Cr 500 Mg Cpcr (Niacin) .[name]... 1-4 tabs by mouth daily  Labs Reviewed: SGOT: 26 (06/27/2010)   SGPT: 41 (06/27/2010)  Lipid Goals: Chol Goal: 200 (12/08/2006)   HDL Goal: 40 (12/08/2006)   LDL Goal: 130 (08/04/2007)   TG Goal: 150 (12/08/2006)  Prior 10 Yr Risk Heart Disease: 6 % (10/24/2010)   HDL:32 (06/27/2010), 32 (12/27/2008)  LDL:92 (06/27/2010), 116 (16/09/9603)  Chol:160 (06/27/2010), 177 (12/27/2008)  Trig:180 (06/27/2010), 144 (12/27/2008)  Problem # 4:  OBESITY NOS (ICD-278.00) BMI 36--> 34. Encouraged him to continue to work on wt loss thru healthy diet and regular exercise.    Complete Medication List: 1)  Aspir-low 81 Mg Tbec (Aspirin) .... Once daily by mouth 2)  Pravastatin Sodium 40 Mg Tabs (Pravastatin sodium) .... 2 tabs by mouth qhs 3)  Lisinopril-hydrochlorothiazide 20-25 Mg Tabs (Lisinopril-hydrochlorothiazide) .[name].. 1 tab by mouth qd 4)  Proair Hfa 108 (90 Base) Mcg/act Aers (Albuterol sulfate) .[name].. 1-2 puffs q4-6 hrs prn 5)  Carvedilol 25 Mg Tabs (Carvedilol) .[name].. 1 tab by mouth bid 6)  Niacin Cr 500 Mg Cpcr (Niacin) .[name].. 1-4 tabs by mouth daily 7)  Tramadol Hcl 50 Mg Tabs (Tramadol hcl) .[name].. 1 tab by mouth two times a day as needed pain  Patient Instructions: 1)  UA:  OK 2)  Recheck BP: 146/95= high. check at home.  Goal is <140/90. 3)  Start B- complex vitamin OTC everyday. 4)  Return for PHYSICAL with fasting labs in 4 mos. Prescriptions: CARVEDILOL 25 MG TABS (CARVEDILOL) 1 tab by mouth bid  #180 x 1   Entered and Authorized by:   Seymour Bars DO   Signed by:   Seymour Bars DO on 02/27/2011   Method used:   Electronically to        [facility] [phone]* (retail)       [address]       [place], [place]  [address]       Ph: [phone]       Fax: [phone]   RxID:   [id number] LISINOPRIL-HYDROCHLOROTHIAZIDE 20-25 MG TABS (LISINOPRIL-HYDROCHLOROTHIAZIDE) 1 tab by mouth qd  #90 x 1   Entered and Authorized by:   Seymour Bars DO   Signed by:   Seymour Bars DO on 02/27/2011   Method used:   Electronically to  [facility] [phone]* (retail)       [address] [place], [place]  [address]       Ph: [phone]       Fax: [phone]   RxID:   [id number] PRAVASTATIN SODIUM 40 MG TABS (PRAVASTATIN SODIUM) 2 tabs by mouth qhs  #60 x 6   Entered and Authorized by:   Seymour Bars DO   Signed by:   Seymour Bars DO on 02/27/2011   Method used:   Electronically to        [facility] [phone]* (retail)       [address] [place], [place]  [address]       Ph: [phone]       Fax: [phone]   RxID:   [id number]    Orders Added: 1)  UA Dipstick w/o Micro (automated)  [81003] 2)  Est. Patient Level III [09323]    Laboratory Results   Urine Tests    Routine Urinalysis   Color: yellow Appearance: Clear Glucose: negative   (Normal Range: Negative) Bilirubin: small   (Normal Range: Negative) Ketone: trace (5)   (Normal Range: Negative) Spec. Gravity: >=1.030   (Normal Range: 1.003-1.035) Blood: negative   (Normal Range: Negative) pH: 5.5   (Normal Range: 5.0-8.0) Protein: 100   (Normal Range: Negative) Urobilinogen: 0.2   (Normal Range: 0-1) Nitrite: negative   (Normal Range: Negative) Leukocyte Esterace: negative   (Normal Range: Negative)

## 2011-07-04 ENCOUNTER — Encounter: Payer: Self-pay | Admitting: Family Medicine

## 2011-07-10 ENCOUNTER — Ambulatory Visit (INDEPENDENT_AMBULATORY_CARE_PROVIDER_SITE_OTHER): Payer: Self-pay | Admitting: Family Medicine

## 2011-07-10 ENCOUNTER — Encounter: Payer: Self-pay | Admitting: Family Medicine

## 2011-07-10 DIAGNOSIS — Z0000 Encounter for general adult medical examination without abnormal findings: Secondary | ICD-10-CM

## 2011-07-10 DIAGNOSIS — I10 Essential (primary) hypertension: Secondary | ICD-10-CM

## 2011-07-10 DIAGNOSIS — E785 Hyperlipidemia, unspecified: Secondary | ICD-10-CM

## 2011-07-10 NOTE — Patient Instructions (Signed)
Stay on current meds.  Work on [employer], regular exercise.  Update fasting labs today.  Will call you w/ results tomorrow.  Return for f/u in 6 mos.

## 2011-07-10 NOTE — Progress Notes (Signed)
  Subjective:    Patient ID: Brandon Mckee, male    DOB: [birth year], 45 y.o.   MRN: [id number]  HPI  45 yo M presents for CPE.  He is due for fasting labs.  He is doing well on his meds, not due for RFs yet.  Denies chest pain or DOE.  Gained 9 lbs since last visit but his wife was in the hospital for 10 wks and she had an MRSA infection in her spine and she now has weakness in her legs.  He is having to do a lot more around the house.  He denies a fam hx of prostate cancer or colon cancer.  She denies fam hx of premature heart dz.  Admits to a poor diet and lack of regular exercise.  BP 142/89  Pulse 69  Ht 5\' 11"  (1.803 m)  Wt 250 lb (113.399 kg)  BMI 34.87 kg/m2  SpO2 96%   Review of Systems Gen: no fevers, chills, hot flashes, night sweats, change in weight GI: no N/V/C/D GU: no dysuria, incontinence or sexual dysfunction CV: no chest pain, DOE, palpitations s or edema Pulm:  Denies CP, SOB or chronic cough      Objective:   Physical Exam  Genitourinary: Prostate normal. Rectal exam shows no external hemorrhoid, no fissure and no mass. Guaiac negative stool.      Gen: alert, well groomed in NAD, obese Neck: no thyromegaly or cervical lymphadenopathy CV: RRR w/o murmur, no audible carotid bruits or abdominal aortic bruits Ext: no edema, clubbing or cyanosis Lungs: CTA bilat w/o W/R/R; nonlabored HEENT:  [place]/AT; PERRLA; oropharynx pink and moist with good dentition Abd: soft, NT, ND, NABS, No HSM, no audible AA bruits Skin: warm and dry; no rash, pallor or jaundice Psych: does not appear anxious or depressed; answers questions appropriately    Assessment & Plan:  Assesment:  1. CPE- Keeping healthy checklist for men reviewed today.  BP a little high.  Work on lower sodium diet and wt loss to get to goal.  BMI 34  in the class I obesity range.     Labs ordered Colonoscopy due at 50, hemoccult neg today DRE today, normal.   Encouraged healthy diet, regular exercise,  MVI daily. Return for next physical in 1 yr.   Continue current meds. Declined Tetanus update today.

## 2011-12-16 ENCOUNTER — Other Ambulatory Visit: Payer: Self-pay | Admitting: *Deleted

## 2011-12-16 MED ORDER — CARVEDILOL 25 MG PO TABS: 25.0000 mg | ORAL_TABLET | Freq: Two times a day (BID) | ORAL | Status: DC

## 2012-01-01 ENCOUNTER — Ambulatory Visit: Payer: Self-pay | Admitting: Family Medicine

## 2012-03-28 ENCOUNTER — Other Ambulatory Visit: Payer: Self-pay | Admitting: *Deleted

## 2012-03-28 MED ORDER — PRAVASTATIN SODIUM 40 MG PO TABS: 40.0000 mg | ORAL_TABLET | Freq: Every day | ORAL | Status: DC

## 2012-05-02 ENCOUNTER — Other Ambulatory Visit: Payer: Self-pay | Admitting: *Deleted

## 2012-05-02 MED ORDER — LISINOPRIL-HYDROCHLOROTHIAZIDE 20-25 MG PO TABS: 1.0000 | ORAL_TABLET | Freq: Every day | ORAL | Status: DC

## 2012-07-18 ENCOUNTER — Other Ambulatory Visit: Payer: Self-pay | Admitting: Family Medicine

## 2012-07-18 NOTE — Telephone Encounter (Signed)
Must make appointment 

## 2012-07-20 ENCOUNTER — Other Ambulatory Visit: Payer: Self-pay | Admitting: Family Medicine

## 2012-09-26 ENCOUNTER — Other Ambulatory Visit: Payer: Self-pay | Admitting: Physician Assistant

## 2012-09-26 ENCOUNTER — Ambulatory Visit (INDEPENDENT_AMBULATORY_CARE_PROVIDER_SITE_OTHER): Payer: Self-pay | Admitting: Physician Assistant

## 2012-09-26 ENCOUNTER — Encounter: Payer: Self-pay | Admitting: Physician Assistant

## 2012-09-26 VITALS — BP 165/94 | HR 85 | Ht 71.0 in | Wt 272.0 lb

## 2012-09-26 DIAGNOSIS — Z79899 Other long term (current) drug therapy: Secondary | ICD-10-CM

## 2012-09-26 DIAGNOSIS — Z23 Encounter for immunization: Secondary | ICD-10-CM

## 2012-09-26 DIAGNOSIS — Z131 Encounter for screening for diabetes mellitus: Secondary | ICD-10-CM

## 2012-09-26 DIAGNOSIS — G2581 Restless legs syndrome: Secondary | ICD-10-CM

## 2012-09-26 DIAGNOSIS — I10 Essential (primary) hypertension: Secondary | ICD-10-CM

## 2012-09-26 DIAGNOSIS — E785 Hyperlipidemia, unspecified: Secondary | ICD-10-CM

## 2012-09-26 LAB — COMPREHENSIVE METABOLIC PANEL
BUN: 12 mg/dL (ref 6–23)
CO2: 29 mEq/L (ref 19–32)
Calcium: 9 mg/dL (ref 8.4–10.5)
Chloride: 105 mEq/L (ref 96–112)
Creat: 0.82 mg/dL (ref 0.50–1.35)
Glucose, Bld: 88 mg/dL (ref 70–99)
Total Bilirubin: 0.5 mg/dL (ref 0.3–1.2)

## 2012-09-26 LAB — LIPID PANEL
Cholesterol: 204 mg/dL — ABNORMAL HIGH (ref 0–200)
HDL: 34 mg/dL — ABNORMAL LOW (ref >39)
LDL Cholesterol: 136 mg/dL — ABNORMAL HIGH (ref 0–99)
Triglycerides: 172 mg/dL — ABNORMAL HIGH (ref <150)

## 2012-09-26 MED ORDER — PRAVASTATIN SODIUM 40 MG PO TABS: 80.0000 mg | ORAL_TABLET | Freq: Every day | ORAL | Status: DC

## 2012-09-26 MED ORDER — CARVEDILOL 25 MG PO TABS: 25.0000 mg | ORAL_TABLET | Freq: Two times a day (BID) | ORAL | Status: DC

## 2012-09-26 MED ORDER — GABAPENTIN 100 MG PO CAPS: ORAL_CAPSULE | ORAL | Status: DC

## 2012-09-26 MED ORDER — LISINOPRIL-HYDROCHLOROTHIAZIDE 20-25 MG PO TABS: 1.0000 | ORAL_TABLET | Freq: Every day | ORAL | Status: DC

## 2012-09-26 MED ORDER — NIACIN ER (ANTIHYPERLIPIDEMIC) 500 MG PO TBCR: 500.0000 mg | EXTENDED_RELEASE_TABLET | Freq: Every day | ORAL | Status: DC

## 2012-09-26 NOTE — Progress Notes (Signed)
  Subjective:    Patient ID: Brandon Mckee, male    DOB: 1966/12/05, 46 y.o.   MRN: [id number]  HPI  Patient is a 46 yo male who presents to the clinic to get med refills and follow up on hyperlipidemia and HTN. He has not been seen in over 1 year. His previous doctor was Dr. Cathey Endow. He feels good today. He denies any CP, palpitations, SOB, fatigue, headaches or vision changes. He has not had bloodwork completed and on time.  BP elevated today ran out of medication for last 2 days. HTN ongoing and not controlled.   Hyperlipidemia ongoing taking medication. Not been checked in a while. Will rehcheck today.   He does complain of jumpy legs at bed time. He has tried massaging his legs at night and using heating pads. He denies any pain, numbness or tingling.This does help some but his bilateral legs still feel very jumpy. He does not notice the symptoms during the day. The symptoms do not wake him up in the middle of the night. He has had a lot more stress on him recently since he had to take care of his wife that has recently been paralyzed from the waist down due to MRSA infection of the spine.   Review of Systems     Objective:   Physical Exam  Constitutional: He is oriented to person, place, and time. He appears well-developed and well-nourished.  HENT:  Head: Normocephalic and atraumatic.  Cardiovascular: Normal rate, regular rhythm, normal heart sounds and intact distal pulses.   Pulmonary/Chest: Effort normal and breath sounds normal. He has no wheezes.  Musculoskeletal:       Muscle feel tight but ROM in normal without pain in both legs. No joint line pain in either knee. Strength 5/5.  Neurological: He is alert and oriented to person, place, and time.  Skin: Skin is warm and dry.       Negative for lower extremity edema.  Psychiatric: He has a normal mood and affect. His behavior is normal.          Assessment & Plan:  Hyperlipidemia-sent patient with lab slip to get  blood work drawn. Patient is completely of pravastatin will go ahead and refill and make changes if need to later. Reminded patient of low-fat diet and importance of regular exercise.  Hypertension-patient's blood pressure is elevated today but he has been out of his medications for the last 2 days. I suspect the elevation is due to not being on blood pressure medications. I did encourage patient to continue a low-salt diet and discussed regular exercise. Once he starts back on his medications that I refill today we will recheck blood pressure in 2 weeks with the nurse visit. We'll check a CMP today.  Restless leg-patient does not have insurance and therefore needs a medication that is cost effective. I would like to try Neurontin 100 mg 30 minutes to an hour before bedtime. Patient was encouraged to start relaxing techniques at night such as warm bath and heating pads. If medication not working he was instructed to call back and we may consider increasing Neurontin. Will check his vitamin B12 today to make sure there's no metabolic causes for jumpy legs.  Pt did get flu shot today.

## 2012-09-26 NOTE — Patient Instructions (Addendum)
Recheck Blood pressure in 2 weeks.

## 2012-09-28 ENCOUNTER — Other Ambulatory Visit: Payer: Self-pay | Admitting: Physician Assistant

## 2012-09-28 LAB — VITAMIN B12: Vitamin B-12: 390 pg/mL (ref 211–911)

## 2012-09-28 MED ORDER — ATORVASTATIN CALCIUM 40 MG PO TABS: 40.0000 mg | ORAL_TABLET | Freq: Every day | ORAL | Status: DC

## 2012-10-14 ENCOUNTER — Ambulatory Visit (INDEPENDENT_AMBULATORY_CARE_PROVIDER_SITE_OTHER): Payer: Self-pay | Admitting: Physician Assistant

## 2012-10-14 ENCOUNTER — Encounter: Payer: Self-pay | Admitting: Physician Assistant

## 2012-10-14 VITALS — BP 143/86 | HR 68

## 2012-10-14 DIAGNOSIS — I10 Essential (primary) hypertension: Secondary | ICD-10-CM

## 2012-10-14 MED ORDER — AMLODIPINE BESYLATE 5 MG PO TABS: 5.0000 mg | ORAL_TABLET | Freq: Every day | ORAL | Status: DC

## 2012-10-14 NOTE — Progress Notes (Signed)
  Subjective:    Patient ID: Brandon Mckee, male    DOB: Jan 10, 1966, 46 y.o.   MRN: [id number] Pt denies CP, SOB, dizziness, or heart palpitations. taking meds as directed without problems. Denies med side effects. 5 min spent with pt. Pt informed to come back in 2 weeks for another BP check  HPI    Review of Systems     Objective:   Physical Exam        Assessment & Plan:  Hypertension- Added Norvasc to BP meds. Recheck in 2 weeks. Discussed with nurse to discuss side effects of peripheral edema or palpitations to call office. Tandy Gaw PA-C

## 2012-11-02 ENCOUNTER — Telehealth: Payer: Self-pay

## 2012-11-02 NOTE — Telephone Encounter (Signed)
Wife called and left a message - Brandon Mckee was prescribed pravastatin for his cholesterol by Dr Cathey Endow, per his wife. His wife is calling to find out if he should take his pravastatin and the Lipitor prescribed by Tandy Gaw, PA-C

## 2012-11-02 NOTE — Telephone Encounter (Signed)
Patient's wife advised

## 2012-11-02 NOTE — Telephone Encounter (Signed)
Pt only needs to be on lipitor. He LDL was too high and pravastatin wasn't working.

## 2013-01-02 ENCOUNTER — Other Ambulatory Visit: Payer: Self-pay | Admitting: Physician Assistant

## 2013-03-05 ENCOUNTER — Other Ambulatory Visit: Payer: Self-pay | Admitting: Physician Assistant

## 2013-03-06 ENCOUNTER — Other Ambulatory Visit: Payer: Self-pay

## 2013-03-06 MED ORDER — AMLODIPINE BESYLATE 5 MG PO TABS: 5.0000 mg | ORAL_TABLET | Freq: Every day | ORAL | Status: DC

## 2013-03-06 MED ORDER — GABAPENTIN 100 MG PO CAPS: 100.0000 mg | ORAL_CAPSULE | Freq: Every day | ORAL | Status: DC

## 2013-03-10 ENCOUNTER — Encounter: Payer: Self-pay | Admitting: Physician Assistant

## 2013-03-10 ENCOUNTER — Ambulatory Visit (INDEPENDENT_AMBULATORY_CARE_PROVIDER_SITE_OTHER): Payer: Self-pay | Admitting: Physician Assistant

## 2013-03-10 VITALS — BP 132/80 | HR 65 | Wt 268.0 lb

## 2013-03-10 DIAGNOSIS — E875 Hyperkalemia: Secondary | ICD-10-CM

## 2013-03-10 DIAGNOSIS — R748 Abnormal levels of other serum enzymes: Secondary | ICD-10-CM

## 2013-03-10 DIAGNOSIS — R7401 Elevation of levels of liver transaminase levels: Secondary | ICD-10-CM

## 2013-03-10 DIAGNOSIS — E8881 Metabolic syndrome: Secondary | ICD-10-CM

## 2013-03-10 DIAGNOSIS — I10 Essential (primary) hypertension: Secondary | ICD-10-CM

## 2013-03-10 DIAGNOSIS — E785 Hyperlipidemia, unspecified: Secondary | ICD-10-CM

## 2013-03-10 LAB — COMPREHENSIVE METABOLIC PANEL
Alkaline Phosphatase: 119 U/L — ABNORMAL HIGH (ref 39–117)
CO2: 30 mEq/L (ref 19–32)
Creat: 0.82 mg/dL (ref 0.50–1.35)
Glucose, Bld: 107 mg/dL — ABNORMAL HIGH (ref 70–99)
Total Bilirubin: 0.6 mg/dL (ref 0.3–1.2)

## 2013-03-10 LAB — LIPID PANEL
HDL: 31 mg/dL — ABNORMAL LOW (ref >39)
LDL Cholesterol: 102 mg/dL — ABNORMAL HIGH (ref 0–99)
Total CHOL/HDL Ratio: 5.3 Ratio
Triglycerides: 156 mg/dL — ABNORMAL HIGH (ref <150)

## 2013-03-10 MED ORDER — LISINOPRIL-HYDROCHLOROTHIAZIDE 20-25 MG PO TABS: 1.0000 | ORAL_TABLET | Freq: Every day | ORAL | Status: DC

## 2013-03-10 MED ORDER — CYCLOBENZAPRINE HCL 5 MG PO TABS: ORAL_TABLET | ORAL | Status: DC

## 2013-03-10 MED ORDER — CARVEDILOL 25 MG PO TABS: 25.0000 mg | ORAL_TABLET | Freq: Two times a day (BID) | ORAL | Status: DC

## 2013-03-10 MED ORDER — AMLODIPINE BESYLATE 5 MG PO TABS: 5.0000 mg | ORAL_TABLET | Freq: Every day | ORAL | Status: DC

## 2013-03-10 NOTE — Progress Notes (Signed)
  Subjective:    Patient ID: Brandon Mckee, male    DOB: 02/08/1966, 47 y.o.   MRN: [id number]  HPI Patient is a 47 year old male who presents to the clinic to followup on hypertension. He is currently on Norvasc, lisinopril/HCTZ. He denies any chest pains, palpitations, headaches, shortness of breath or vision changes. He does not check his blood pressure at home. He is trying to maintain and docile diet but admits to prematurity being whatever he wants. He has not had his cholesterol checked in a while and does have a history of electrolyte imbalances as well elevated liver enzymes that were never followed up on.   Patient also reports some right shoulder pain and tightness. He has been doing a lot of heavy lifting at work recently. He denies any range of motion or strength problems. Pain is not continual but worse when he is actively lifting. He denies any radiation of pain. He admits to feeling tight. He has not tried anything to make better.    Review of Systems     Objective:   Physical Exam  Constitutional: He is oriented to person, place, and time. He appears well-developed and well-nourished.  HENT:  Head: Normocephalic and atraumatic.  Cardiovascular: Normal rate, regular rhythm and normal heart sounds.   Pulmonary/Chest: Effort normal and breath sounds normal. He has no wheezes.  Musculoskeletal:  ROM of right shoulder is normal without pain. Strength is 5/5. Negative drop arm sign. Muscle tightness around shoulder, upper back muscles(trapizues) and neck. No pain to palpation over right acromion or clavice.  Neurological: He is alert and oriented to person, place, and time.  Skin: Skin is warm and dry.  Psychiatric: He has a normal mood and affect. His behavior is normal.          Assessment & Plan:  HTN- Refilled BP meds for 6 months. If does well will consider switching to tribenzor to eliminate taking so many medications. Reminded of low salt diet. Will check  kidney function today.   Muscle strain- Gave exercises. Reminded to use ice/heat. Ibuprofen 600mg  up to three times a day. Gave flexeril to use at bedtime. Consider massage.   Hyperlipidemia- Will recheck today.

## 2013-03-10 NOTE — Patient Instructions (Signed)
Ibuprofen 600mg  up to three times a day. Flexeril at bedtime. Continue to ice and heat.   Thoracic Strain You have injured the muscles or tendons that attach to the upper part of your back behind your chest. This injury is called a thoracic strain, thoracic sprain, or mid-back strain.  CAUSES  The cause of thoracic strain varies. A less severe injury involves pulling a muscle or tendon without tearing it. A more severe injury involves tearing (rupturing) a muscle or tendon. With less severe injuries, there may be little loss of strength. Sometimes, there are breaks (fractures) in the bones to which the muscles are attached. These fractures are rare, unless there was a direct hit (trauma) or you have weak bones due to osteoporosis or age. Longstanding strains may be caused by overuse or improper form during certain movements. Obesity can also increase your risk for back injuries. Sudden strains may occur due to injury or not warming up properly before exercise. Often, there is no obvious cause for a thoracic strain. SYMPTOMS  The main symptom is pain, especially with movement, such as during exercise. DIAGNOSIS  Your caregiver can usually tell what is wrong by taking an X-ray and doing a physical exam. TREATMENT   Physical therapy may be helpful for recovery. Your caregiver can give you exercises to do or refer you to a physical therapist after your pain improves.  After your pain improves, strengthening and conditioning programs appropriate for your sport or occupation may be helpful.  Always warm up before physical activities or athletics. Stretching after physical activity may also help.  Certain over-the-counter medicines may also help. Ask your caregiver if there are medicines that would help you. If this is your first thoracic strain injury, proper care and proper healing time before starting activities should prevent long-term problems. Torn ligaments and tendons require as long to heal as  broken bones. Average healing times may be only 1 week for a mild strain. For torn muscles and tendons, healing time may be up to 6 weeks to 2 months. HOME CARE INSTRUCTIONS   Apply ice to the injured area. Ice massages may also be used as directed.  Put ice in a plastic bag.  Place a towel between your skin and the bag.  Leave the ice on for 15 to 20 minutes, 3 to 4 times a day, for the first 2 days.  Only take over-the-counter or prescription medicines for pain, discomfort, or fever as directed by your caregiver.  Keep your appointments for physical therapy if this was prescribed.  Use wraps and back braces as instructed. SEEK IMMEDIATE MEDICAL CARE IF:   You have an increase in bruising, swelling, or pain.  Your pain has not improved with medicines.  You develop new shortness of breath, chest pain, or fever.  Problems seem to be getting worse rather than better. MAKE SURE YOU:   Understand these instructions.  Will watch your condition.  Will get help right away if you are not doing well or get worse. Document Released: 02/27/2004 Document Revised: 02/29/2012 Document Reviewed: 01/23/2011 [facility] Patient Information 2013 [place], [place].

## 2013-03-16 ENCOUNTER — Telehealth: Payer: Self-pay | Admitting: *Deleted

## 2013-03-16 DIAGNOSIS — R7301 Impaired fasting glucose: Secondary | ICD-10-CM

## 2013-03-16 NOTE — Telephone Encounter (Signed)
a1c ordered

## 2013-03-17 LAB — HEMOGLOBIN A1C
Hgb A1c MFr Bld: 6.1 % — ABNORMAL HIGH (ref <5.7)
Mean Plasma Glucose: 128 mg/dL — ABNORMAL HIGH (ref <117)

## 2013-04-11 ENCOUNTER — Other Ambulatory Visit: Payer: Self-pay | Admitting: Physician Assistant

## 2013-04-12 ENCOUNTER — Ambulatory Visit (INDEPENDENT_AMBULATORY_CARE_PROVIDER_SITE_OTHER): Payer: BC Managed Care – PPO

## 2013-04-12 ENCOUNTER — Ambulatory Visit (INDEPENDENT_AMBULATORY_CARE_PROVIDER_SITE_OTHER): Payer: BC Managed Care – PPO | Admitting: Sports Medicine

## 2013-04-12 ENCOUNTER — Other Ambulatory Visit: Payer: Self-pay | Admitting: Sports Medicine

## 2013-04-12 ENCOUNTER — Encounter: Payer: Self-pay | Admitting: Sports Medicine

## 2013-04-12 VITALS — BP 133/79 | HR 78 | Wt 273.0 lb

## 2013-04-12 DIAGNOSIS — M1710 Unilateral primary osteoarthritis, unspecified knee: Secondary | ICD-10-CM

## 2013-04-12 DIAGNOSIS — M170 Bilateral primary osteoarthritis of knee: Secondary | ICD-10-CM

## 2013-04-12 DIAGNOSIS — M25569 Pain in unspecified knee: Secondary | ICD-10-CM

## 2013-04-12 DIAGNOSIS — IMO0002 Reserved for concepts with insufficient information to code with codable children: Secondary | ICD-10-CM

## 2013-04-12 MED ORDER — MELOXICAM 15 MG PO TABS: ORAL_TABLET | ORAL | Status: DC

## 2013-04-12 NOTE — Assessment & Plan Note (Signed)
Aspiration and injection as above. Repeat x-rays. Mobic as needed for pain. Return in 4 weeks.

## 2013-04-12 NOTE — Progress Notes (Signed)
   Subjective:    CC: Left knee pain  HPI: Brandon Mckee has known bilateral knee osteoarthritis. His last injection was a long time ago, possibly years. Over the past few weeks and months he developed increased swelling in his left knee associated with pain in the posterior aspect. He also has pain in the medial joint line. Pain is localized, aching, worsening. 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  Past medical history, Surgical history, Family history not pertinant except as noted below, Social history, Allergies, and medications have been entered into the medical record, reviewed, and no changes needed.   Review of Systems: No headache, visual changes, nausea, vomiting, diarrhea, constipation, dizziness, abdominal pain, skin rash, fevers, chills, night sweats, weight loss, swollen lymph nodes, body aches, joint swelling, muscle aches, chest pain, shortness of breath, mood changes, visual or auditory hallucinations.   Objective:   General: Well Developed, well nourished, and in no acute distress.  Neuro/Psych: Alert and oriented x3, extra-ocular muscles intact, able to move all 4 extremities, sensation grossly intact. Skin: Warm and dry, no rashes noted.  Respiratory: Not using accessory muscles, speaking in full sentences, trachea midline.  Cardiovascular: Pulses palpable, no extremity edema. Abdomen: Does not appear distended. Left knee: Moderate effusion is palpable, significant callus over both knees. ROM full in flexion and extension and lower leg rotation. Ligaments with solid consistent endpoints including ACL, PCL, LCL, MCL. Negative Mcmurray's, Apley's, and Thessalonian tests. Non painful patellar compression. Patellar glide without crepitus. Patellar and quadriceps tendons unremarkable. Hamstring and quadriceps strength is normal.   Procedure: Real-time Ultrasound Guided aspiration/injection of left knee Device: GE Logiq E    Ultrasound guided injection is preferred based studies that show increased duration, increased effect, greater accuracy, decreased procedural pain, increased response rate, and decreased cost with ultrasound guided versus blind injection.  Verbal informed consent obtained.  Time-out conducted.  Noted no overlying erythema, induration, or other signs of local infection.  Skin prepped in a sterile fashion.  Local anesthesia: Topical Ethyl chloride.  With sterile technique and under real time ultrasound guidance:  22-gauge needle advanced into the suprapatellar recess, 42 cc of straw-colored fluid aspirated, syringe switched in 2 cc Kenalog 40, 4 cc lidocaine injected easily into the knee joint. Completed without difficulty  Pain immediately resolved suggesting accurate placement of the medication.  Advised to call if fevers/chills, erythema, induration, drainage, or persistent bleeding.  Images permanently stored and available for review in the ultrasound unit.  Impression: Technically successful ultrasound guided injection. Impression and Recommendations:   This case required medical decision making of moderate complexity.

## 2013-05-12 ENCOUNTER — Ambulatory Visit (INDEPENDENT_AMBULATORY_CARE_PROVIDER_SITE_OTHER): Payer: BC Managed Care – PPO | Admitting: Sports Medicine

## 2013-05-12 ENCOUNTER — Encounter: Payer: Self-pay | Admitting: Sports Medicine

## 2013-05-12 VITALS — BP 144/94 | HR 75 | Wt 273.0 lb

## 2013-05-12 DIAGNOSIS — M1710 Unilateral primary osteoarthritis, unspecified knee: Secondary | ICD-10-CM

## 2013-05-12 DIAGNOSIS — M170 Bilateral primary osteoarthritis of knee: Secondary | ICD-10-CM

## 2013-05-12 DIAGNOSIS — IMO0002 Reserved for concepts with insufficient information to code with codable children: Secondary | ICD-10-CM

## 2013-05-12 NOTE — Assessment & Plan Note (Signed)
Repeat aspiration and injection today. Knee brace given. Continue exercises. Return in one month, if no better we will proceed with Visco supplementation.

## 2013-05-12 NOTE — Progress Notes (Signed)
  Subjective:    CC: Followup  HPI: Brandon Mckee has osteoarthritis of both knees, I aspirated and injected his left knee proximally a month ago. He had excellent pain relief for a week and a half and then some pain came back. It came back with swelling. Pain is localized over the suprapatellar recess, no mechanical symptoms, moderate, stable.  Past medical history, Surgical history, Family history not pertinant except as noted below, Social history, Allergies, and medications have been entered into the medical record, reviewed, and no changes needed.   Review of Systems: No fevers, chills, night sweats, weight loss, chest pain, or shortness of breath.   Objective:    General: Well Developed, well nourished, and in no acute distress.  Neuro: Alert and oriented x3, extra-ocular muscles intact, sensation grossly intact.  HEENT: Normocephalic, atraumatic, pupils equal round reactive to light, neck supple, no masses, no lymphadenopathy, thyroid nonpalpable.  Skin: Warm and dry, no rashes. Cardiac: Regular rate and rhythm, no murmurs rubs or gallops, no lower extremity edema.  Respiratory: Clear to auscultation bilaterally. Not using accessory muscles, speaking in full sentences. Left Knee: There is a visible and palpable moderate effusion. There is no joint line pain. ROM full in flexion and extension and lower leg rotation. Ligaments with solid consistent endpoints including ACL, PCL, LCL, MCL. Negative Mcmurray's, Apley's, and Thessalonian tests. Non painful patellar compression. Patellar glide without crepitus. Patellar and quadriceps tendons unremarkable. Hamstring and quadriceps strength is normal.   Procedure: Real-time Ultrasound Guided aspiration/injection of left knee Device: GE Logiq E  Ultrasound guided injection is preferred based studies that show increased duration, increased effect, greater accuracy, decreased procedural pain, increased response rate, and decreased cost with  ultrasound guided versus blind injection.  Verbal informed consent obtained.  Time-out conducted.  Noted no overlying erythema, induration, or other signs of local infection.  Skin prepped in a sterile fashion.  Local anesthesia: Topical Ethyl chloride.  With sterile technique and under real time ultrasound guidance:  22-gauge needle advanced in the suprapatellar recess, 15 cc of straw-colored fluid aspirated, syringe switched and 2 cc Kenalog 40, 4 cc lidocaine injected easily. Completed without difficulty  Pain immediately resolved suggesting accurate placement of the medication.  Advised to call if fevers/chills, erythema, induration, drainage, or persistent bleeding.  Images permanently stored and available for review in the ultrasound unit.  Impression: Technically successful ultrasound guided injection.  Knee brace applied. Impression and Recommendations:

## 2013-05-15 ENCOUNTER — Other Ambulatory Visit: Payer: Self-pay | Admitting: Physician Assistant

## 2013-06-06 ENCOUNTER — Ambulatory Visit (INDEPENDENT_AMBULATORY_CARE_PROVIDER_SITE_OTHER): Payer: BC Managed Care – PPO | Admitting: Sports Medicine

## 2013-06-06 VITALS — BP 155/96 | HR 88 | Wt 275.0 lb

## 2013-06-06 DIAGNOSIS — M1710 Unilateral primary osteoarthritis, unspecified knee: Secondary | ICD-10-CM

## 2013-06-06 DIAGNOSIS — M170 Bilateral primary osteoarthritis of knee: Secondary | ICD-10-CM

## 2013-06-06 DIAGNOSIS — B356 Tinea cruris: Secondary | ICD-10-CM

## 2013-06-06 DIAGNOSIS — IMO0002 Reserved for concepts with insufficient information to code with codable children: Secondary | ICD-10-CM

## 2013-06-06 MED ORDER — FLUCONAZOLE 200 MG PO TABS: 200.0000 mg | ORAL_TABLET | ORAL | Status: DC

## 2013-06-06 MED ORDER — CLOTRIMAZOLE-BETAMETHASONE 1-0.05 % EX CREA: TOPICAL_CREAM | Freq: Two times a day (BID) | CUTANEOUS | Status: DC

## 2013-06-06 NOTE — Assessment & Plan Note (Signed)
I would like to go ahead and set up Brandon Mckee for [employer] supplementation of his left knee. We will start the first injection, once approved, with an addition of Depo-Medrol.

## 2013-06-06 NOTE — Progress Notes (Signed)
  Subjective:    CC: Followup  HPI: Left knee osteoarthritis: I aspirated and injected Jowel's knee at the last visit, he returns today without any pain, and only slight pain with increased ambulation. Unfortunately he has developed an effusion again. His pain is over the medial joint line, but he continues to deny any mechanical symptoms, pain is mild. Stable.  Groin rash: Comes every year when the weather gets hot and he sweats. It is pruritic, and burns slightly. He has noted erythema, and a foul odor from here as well.  Past medical history, Surgical history, Family history not pertinant except as noted below, Social history, Allergies, and medications have been entered into the medical record, reviewed, and no changes needed.   Review of Systems: No fevers, chills, night sweats, weight loss, chest pain, or shortness of breath.   Objective:    General: Well Developed, well nourished, and in no acute distress.  Neuro: Alert and oriented x3, extra-ocular muscles intact, sensation grossly intact.  HEENT: Normocephalic, atraumatic, pupils equal round reactive to light, neck supple, no masses, no lymphadenopathy, thyroid nonpalpable.  Skin: Warm and dry, there is a large erythematous plaque in the groin consistent with tinea cruris. Cardiac: Regular rate and rhythm, no murmurs rubs or gallops, no lower extremity edema.  Respiratory: Clear to auscultation bilaterally. Not using accessory muscles, speaking in full sentences. Left Knee: Visible and palpable moderate effusion. Palpation is tender on the medial joint line. ROM full in flexion and extension and lower leg rotation. Ligaments with solid consistent endpoints including ACL, PCL, LCL, MCL. Negative Mcmurray's, Apley's, and Thessalonian tests. Non painful patellar compression. Patellar glide without crepitus. Patellar and quadriceps tendons unremarkable. Hamstring and quadriceps strength is normal.   Impression and  Recommendations:

## 2013-06-06 NOTE — Patient Instructions (Addendum)
Jock Itch Jock itch is a fungal infection of the skin in the groin area. It is sometimes called "ringworm" even though it is not caused by a worm. A fungus is a type of germ that thrives in dark, damp places.  CAUSES  This infection may spread from:  A fungus infection elsewhere on the body (such as athlete's foot).  Sharing towels or clothing. This infection is more common in:  Hot, humid climates.  People who wear tight-fitting clothing or wet bathing suits for long periods of time.  Athletes.  Overweight people.  People with diabetes. SYMPTOMS  Jock itch causes the following symptoms:  Red, pink or brown rash in the groin. Rash may spread to the thighs, anus, and buttocks.  Itching. DIAGNOSIS  Your caregiver may make the diagnosis by looking at the rash. Sometimes a skin scraping will be sent to test for fungus. Testing can be done either by looking under the microscope or by doing a culture (test to try to grow the fungus). A culture can take up to 2 weeks to come back. TREATMENT  Jock itch may be treated with:  Skin cream or ointment to kill fungus.  Medicine by mouth to kill fungus.  Skin cream or ointment to calm the itching.  Compresses or medicated powders to dry the infected skin. HOME CARE INSTRUCTIONS   Be sure to treat the rash completely. Follow your caregiver's instructions. It can take a couple of weeks to treat. If you do not treat the infection long enough, the rash can come back.  Wear loose-fitting clothing.  Men should wear cotton boxer shorts.  Women should wear cotton underwear.  Avoid hot baths.  Dry the groin area well after bathing. SEEK MEDICAL CARE IF:   Your rash is worse.  Your rash is spreading.  Your rash returns after treatment is finished.  Your rash is not gone in 4 weeks. Fungal infections are slow to respond to treatment. Some redness may remain for several weeks after the fungus is gone. SEEK IMMEDIATE MEDICAL CARE  IF:  The area becomes red, warm, tender, and swollen.  You have a fever. Document Released: 11/27/2002 Document Revised: 02/29/2012 Document Reviewed: 10/26/2008 [employer] Patient Information 2014 [employer].  

## 2013-06-06 NOTE — Assessment & Plan Note (Signed)
Topical Lotrisone. Diflucan 200 mg weekly

## 2013-06-08 ENCOUNTER — Ambulatory Visit: Payer: BC Managed Care – PPO | Admitting: Sports Medicine

## 2013-06-09 ENCOUNTER — Ambulatory Visit: Payer: BC Managed Care – PPO | Admitting: Sports Medicine

## 2013-06-13 ENCOUNTER — Telehealth: Payer: Self-pay | Admitting: *Deleted

## 2013-06-13 MED ORDER — SODIUM HYALURONATE (VISCOSUP) 20 MG/2ML IX SOLN: 2.0000 mL | INTRA_ARTICULAR | Status: DC

## 2013-06-13 NOTE — Telephone Encounter (Signed)
LM on VM for pt to return call so that I can inform him that his insurance doesn't require PA for the Euflexa injection for the knees and that we need to give him an RX for the Euflexxa so that he can get it filled and make an appt to get it injected.   Dr. Benjamin Stain, please write the rx for the Euflexxa so that I can give it to the pt to get it filled so that we can start the injections for the knees.  Thanks.  Meyer Cory, LPN

## 2013-06-13 NOTE — Telephone Encounter (Signed)
Rx in my outbox

## 2013-06-14 NOTE — Telephone Encounter (Signed)
Spoke to patient informed him of Euflexxa Rx for knee injection. Rhonda Cunningham,CMA

## 2013-06-29 ENCOUNTER — Telehealth: Payer: Self-pay | Admitting: *Deleted
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
## 2013-06-30 ENCOUNTER — Encounter: Payer: Self-pay | Admitting: Physician Assistant

## 2013-06-30 ENCOUNTER — Ambulatory Visit (INDEPENDENT_AMBULATORY_CARE_PROVIDER_SITE_OTHER): Payer: BC Managed Care – PPO | Admitting: Physician Assistant

## 2013-06-30 VITALS — BP 129/72 | HR 84 | Wt 280.0 lb

## 2013-06-30 DIAGNOSIS — R600 Localized edema: Secondary | ICD-10-CM

## 2013-06-30 DIAGNOSIS — R609 Edema, unspecified: Secondary | ICD-10-CM

## 2013-06-30 LAB — BASIC METABOLIC PANEL WITH GFR
BUN: 15 mg/dL (ref 6–23)
CO2: 30 mEq/L (ref 19–32)
Chloride: 106 mEq/L (ref 96–112)
Creat: 1.07 mg/dL (ref 0.50–1.35)
GFR, Est Non African American: 83 mL/min
Glucose, Bld: 174 mg/dL — ABNORMAL HIGH (ref 70–99)

## 2013-06-30 LAB — BRAIN NATRIURETIC PEPTIDE: Brain Natriuretic Peptide: 36.4 pg/mL (ref 0.0–100.0)

## 2013-06-30 MED ORDER — FUROSEMIDE 20 MG PO TABS: ORAL_TABLET | ORAL | Status: DC

## 2013-06-30 NOTE — Progress Notes (Signed)
  Subjective:    Patient ID: Brandon Mckee, male    DOB: 01/07/1966, 47 y.o.   MRN: [id number]  HPI Patient is a 47 male who presents to the clinic with lower leg edema. He has had a history of swelling of the legs in the past. He is on lisinopril HCTZ and doing well. He denies any pain in the lower extremity. He feels like the swelling is getting worse. He does admit to being out of work for the past 2 months and recently going back and that's when he noticed the swelling. He has not done anything to make better. He has occasionally been short of breath but reports that is mostly with exertion. He denies any cough, fever. He does not have any numbness or tingling of his lower extremities.   Review of Systems     Objective:   Physical Exam  Constitutional: He is oriented to person, place, and time. He appears well-developed and well-nourished.  HENT:  Head: Normocephalic and atraumatic.  Eyes: Conjunctivae are normal.  Neck: Normal range of motion. Neck supple. No JVD present.  Cardiovascular: Normal rate, regular rhythm and normal heart sounds.   Pulmonary/Chest: Effort normal and breath sounds normal. He has no wheezes. He has no rales.  Neurological: He is alert and oriented to person, place, and time.  Skin:  1+ pitting edema in bilateral legs and ankles.  Psychiatric: He has a normal mood and affect. His behavior is normal.          Assessment & Plan:  Lower leg edema-concerned because last potassium was borderline. Also will check BNP to make sure shortness of breath first sign of fluid overload. We'll recheck potassium today before giving Lasix. Stat lab came back and potassium was 3.9. Call patient and sent Lasix 20 mg to use for the next 3 days and then as needed for leg swelling. Patient educated about wrapping with Ace bandage for compression, elevation and keeping to a low salt diet under 1.5 mg. Patient is to followup as needed.

## 2013-06-30 NOTE — Patient Instructions (Addendum)
Limit salt. Compress with stockings. Elevate. Then i call if potassium is up we call lasix.  Peripheral Edema You have swelling in your legs (peripheral edema). This swelling is due to excess accumulation of salt and water in your body. Edema may be a sign of heart, kidney or liver disease, or a side effect of a medication. It may also be due to problems in the leg veins. Elevating your legs and using special support stockings may be very helpful, if the cause of the swelling is due to poor venous circulation. Avoid long periods of standing, whatever the cause. Treatment of edema depends on identifying the cause. Chips, pretzels, pickles and other salty foods should be avoided. Restricting salt in your diet is almost always needed. Water pills (diuretics) are often used to remove the excess salt and water from your body via urine. These medicines prevent the kidney from reabsorbing sodium. This increases urine flow. Diuretic treatment may also result in lowering of potassium levels in your body. Potassium supplements may be needed if you have to use diuretics daily. Daily weights can help you keep track of your progress in clearing your edema. You should call your caregiver for follow up care as recommended. SEEK IMMEDIATE MEDICAL CARE IF:   You have increased swelling, pain, redness, or heat in your legs.  You develop shortness of breath, especially when lying down.  You develop chest or abdominal pain, weakness, or fainting.  You have a fever. Document Released: 01/14/2005 Document Revised: 02/29/2012 Document Reviewed: 12/25/2009 [facility] Patient Information 2014 [place], [place].

## 2013-07-01 NOTE — Telephone Encounter (Signed)
PT can come in to have removed.

## 2013-07-27 ENCOUNTER — Telehealth: Payer: Self-pay | Admitting: *Deleted

## 2013-07-27 MED ORDER — SODIUM HYALURONATE (VISCOSUP) 20 MG/2ML IX SOLN: 2.0000 mL | INTRA_ARTICULAR | Status: DC

## 2013-07-27 NOTE — Telephone Encounter (Signed)
Pt walked in and states his rx for euflexxa has to be sent to [facility] and then they will ship it to [place] and then he can come here and get the injections. Pt brought in the paper rx so there is no need to [facility] rx at [facility]. [facility]

## 2013-07-28 ENCOUNTER — Other Ambulatory Visit: Payer: Self-pay

## 2013-07-31 ENCOUNTER — Other Ambulatory Visit: Payer: Self-pay | Admitting: Physician Assistant

## 2013-08-22 ENCOUNTER — Other Ambulatory Visit: Payer: Self-pay | Admitting: Sports Medicine

## 2013-08-22 ENCOUNTER — Telehealth: Payer: Self-pay | Admitting: *Deleted

## 2013-08-22 MED ORDER — SODIUM HYALURONATE (VISCOSUP) 20 MG/2ML IX SOLN: 2.0000 mL | INTRA_ARTICULAR | Status: DC

## 2013-08-22 NOTE — Telephone Encounter (Signed)
Called [facility] to see pt has not received his rx for the Euflexxa yet. Pharmacist states in their profile it shows that they lost contact with the Dr.'s office because they didn't have the location and how long the pt would be getting the injections. After looking back at the progress notes I told the pharmacist that it was for the left knee and he would get the injections for 5 weeks. ( I said 5 weeks because that's how long supartz is usually given) Pt has been re enrolled and notified

## 2013-08-25 ENCOUNTER — Telehealth: Payer: Self-pay | Admitting: *Deleted

## 2013-08-25 NOTE — Telephone Encounter (Signed)
Received call from pt's pharmacy in regards to the Euflexxa. They will be shipping it to the office and informing pt before they ship it.  FYI.  Meyer Cory, LPN

## 2013-08-30 ENCOUNTER — Telehealth: Payer: Self-pay

## 2013-08-30 ENCOUNTER — Other Ambulatory Visit: Payer: Self-pay

## 2013-08-30 NOTE — Telephone Encounter (Signed)
I spoke with Jon Gills about patient Rx for Euflexxa verified the injection site and they are shipping medication to the office and she said she would contact the patient to let him know its being shipped. Dniya Neuhaus,CMA

## 2013-09-08 ENCOUNTER — Encounter: Payer: Self-pay | Admitting: Physician Assistant

## 2013-09-08 ENCOUNTER — Ambulatory Visit (INDEPENDENT_AMBULATORY_CARE_PROVIDER_SITE_OTHER): Payer: BC Managed Care – PPO | Admitting: Physician Assistant

## 2013-09-08 ENCOUNTER — Other Ambulatory Visit: Payer: Self-pay | Admitting: Physician Assistant

## 2013-09-08 VITALS — BP 158/100 | HR 76 | Wt 283.0 lb

## 2013-09-08 DIAGNOSIS — E8881 Metabolic syndrome: Secondary | ICD-10-CM

## 2013-09-08 DIAGNOSIS — Z125 Encounter for screening for malignant neoplasm of prostate: Secondary | ICD-10-CM

## 2013-09-08 DIAGNOSIS — R0602 Shortness of breath: Secondary | ICD-10-CM

## 2013-09-08 DIAGNOSIS — R0683 Snoring: Secondary | ICD-10-CM

## 2013-09-08 DIAGNOSIS — M170 Bilateral primary osteoarthritis of knee: Secondary | ICD-10-CM

## 2013-09-08 DIAGNOSIS — IMO0002 Reserved for concepts with insufficient information to code with codable children: Secondary | ICD-10-CM

## 2013-09-08 DIAGNOSIS — H1010 Acute atopic conjunctivitis, unspecified eye: Secondary | ICD-10-CM

## 2013-09-08 DIAGNOSIS — R5381 Other malaise: Secondary | ICD-10-CM

## 2013-09-08 DIAGNOSIS — Z131 Encounter for screening for diabetes mellitus: Secondary | ICD-10-CM

## 2013-09-08 DIAGNOSIS — J309 Allergic rhinitis, unspecified: Secondary | ICD-10-CM

## 2013-09-08 DIAGNOSIS — Q828 Other specified congenital malformations of skin: Secondary | ICD-10-CM

## 2013-09-08 DIAGNOSIS — E785 Hyperlipidemia, unspecified: Secondary | ICD-10-CM

## 2013-09-08 DIAGNOSIS — R0989 Other specified symptoms and signs involving the circulatory and respiratory systems: Secondary | ICD-10-CM

## 2013-09-08 DIAGNOSIS — Z23 Encounter for immunization: Secondary | ICD-10-CM

## 2013-09-08 DIAGNOSIS — E538 Deficiency of other specified B group vitamins: Secondary | ICD-10-CM

## 2013-09-08 DIAGNOSIS — M1710 Unilateral primary osteoarthritis, unspecified knee: Secondary | ICD-10-CM

## 2013-09-08 DIAGNOSIS — R0609 Other forms of dyspnea: Secondary | ICD-10-CM

## 2013-09-08 DIAGNOSIS — I10 Essential (primary) hypertension: Secondary | ICD-10-CM

## 2013-09-08 DIAGNOSIS — H1013 Acute atopic conjunctivitis, bilateral: Secondary | ICD-10-CM

## 2013-09-08 LAB — PSA, TOTAL AND FREE
PSA, Free Pct: 25 % (ref >25)
PSA, Free: 0.15 ng/mL
PSA: 0.61 ng/mL (ref <=4.00)

## 2013-09-08 LAB — COMPLETE METABOLIC PANEL WITH GFR
ALT: 83 U/L — ABNORMAL HIGH (ref 0–53)
Albumin: 4.2 g/dL (ref 3.5–5.2)
CO2: 31 mEq/L (ref 19–32)
Calcium: 9.2 mg/dL (ref 8.4–10.5)
Chloride: 104 mEq/L (ref 96–112)
GFR, Est African American: >89 mL/min
Glucose, Bld: 107 mg/dL — ABNORMAL HIGH (ref 70–99)
Potassium: 3.9 mEq/L (ref 3.5–5.3)
Sodium: 142 mEq/L (ref 135–145)
Total Bilirubin: 0.7 mg/dL (ref 0.3–1.2)
Total Protein: 6.8 g/dL (ref 6.0–8.3)

## 2013-09-08 LAB — CBC
Hemoglobin: 14.3 g/dL (ref 13.0–17.0)
MCH: 30.1 pg (ref 26.0–34.0)
Platelets: 209 10*3/uL (ref 150–400)
RBC: 4.75 MIL/uL (ref 4.22–5.81)
WBC: 5.9 10*3/uL (ref 4.0–10.5)

## 2013-09-08 LAB — LIPID PANEL: LDL Cholesterol: 133 mg/dL — ABNORMAL HIGH (ref 0–99)

## 2013-09-08 MED ORDER — AMLODIPINE BESYLATE 5 MG PO TABS: 5.0000 mg | ORAL_TABLET | Freq: Every day | ORAL | Status: DC

## 2013-09-08 MED ORDER — CARVEDILOL 25 MG PO TABS: 25.0000 mg | ORAL_TABLET | Freq: Two times a day (BID) | ORAL | Status: DC

## 2013-09-08 MED ORDER — LISINOPRIL-HYDROCHLOROTHIAZIDE 20-25 MG PO TABS: 1.0000 | ORAL_TABLET | Freq: Every day | ORAL | Status: DC

## 2013-09-08 MED ORDER — AZELASTINE HCL 0.05 % OP SOLN: 1.0000 [drp] | Freq: Two times a day (BID) | OPHTHALMIC | Status: DC

## 2013-09-08 NOTE — Patient Instructions (Signed)
BP high today but no medication. Will recheck in 1-2 weeks and then remove skin tags.   Will get Sleep apnea test.   I will send allergic eye drops to pharmacy. Consider eye doctor to have eye checked.

## 2013-09-08 NOTE — Progress Notes (Signed)
  Subjective:    Patient ID: Brandon Mckee, male    DOB: February 15, 1966, 47 y.o.   MRN: [id number]  HPI Patient is a 47 yo male who presents to the clinic to follow up on HTN. His wife is concerned but not able to make it to appointment and sent list of her concerns.   HTN- Pt did not take medication today because concerned that would interfere with blood work. Denies any headaches, CP, or palpitations. His vision is blurry sometimes but mostly up close. He does not wear glasses. His eyes do itch and get red a lot. Worse when works with saw dust.   Skin tags- Pt has skin tags in the axillary region that make it hard for him to clean himself and they often bleed and get caught in things. Nothing makes better.   Fatigue/snoring- Hx of b12 deficency. Not currently taking any supplements. Snores every night and wakes up multiple times a night. Obese. Wakes up tired and short of breath. Denies any wheezing. Any time his does any physical task he is out of breath.    Osteoarthritis of both knees- pt is waiting on supartz shots to come in for Dr. Karie Schwalbe to inject. He has not heard anything.    Review of Systems     Objective:   Physical Exam  Constitutional: He is oriented to person, place, and time. He appears well-developed and well-nourished.  Obese.  HENT:  Head: Normocephalic and atraumatic.  Eyes:  Bilateral conjunctiva injected. Watery discharge.  Neck: Normal range of motion. Neck supple.  Cardiovascular: Normal rate, regular rhythm and normal heart sounds.   Pulmonary/Chest: Effort normal and breath sounds normal. He has no wheezes.  Lymphadenopathy:    He has no cervical adenopathy.  Neurological: He is alert and oriented to person, place, and time.  Skin:  Numerous skin tags in axillary region and around neck.   Psychiatric: He has a normal mood and affect. His behavior is normal.          Assessment & Plan:  HTN- discussed with pt need him to take medication and get BP  reading. Follow up in 1-2 weeks. Refilled medications. Reminded of low salt diet as well and regular exercise.   Osteoarthritis- Con Memos are in. Make appt for shots with Dr. Karie Schwalbe. Continue to take NSAIDs as needed for pain.   Skin tags- we need to remove. Schedule an appt in 1-2 weeks and can remove all skin tags.   Fatigue/snoring/SOB- will check BNP to make sure no increase in fluid causing SOB. Pt does have hx of bilateral lower leg edema and using lasix. Will check B12 since does have a hx of b12 deficency. will also look at testosterone/cbc. Will order sleep apnea testing could be causing a lot of fatigue/hypertension/shortness of breath.   Allergic conjunctivitis/vision changes- Sent optivar to pharmacy. Discussed daily zyrtec/claritin. Wear safety goggles at work. Could consider nasal spray if not improving. I would also recommend eye doctor appt.    Hyperlipidemia- will recheck today.  Screening labs were ordered. CmP/PSA  Flu shot given today without any complications.   Spent 30 minutes with patient and greater than 50 percent of visit spent coordinating treatment plan.

## 2013-09-09 LAB — BRAIN NATRIURETIC PEPTIDE: Brain Natriuretic Peptide: 9.4 pg/mL (ref 0.0–100.0)

## 2013-09-11 ENCOUNTER — Ambulatory Visit: Payer: BC Managed Care – PPO | Admitting: Physician Assistant

## 2013-09-11 LAB — TESTOSTERONE, FREE, TOTAL, SHBG
Testosterone-% Free: 2.1 % (ref 1.6–2.9)
Testosterone: 168 ng/dL — ABNORMAL LOW (ref 300–890)

## 2013-09-12 LAB — HEMOGLOBIN A1C: Hgb A1c MFr Bld: 6.8 % — ABNORMAL HIGH (ref <5.7)

## 2013-09-13 ENCOUNTER — Other Ambulatory Visit: Payer: Self-pay | Admitting: Physician Assistant

## 2013-09-13 MED ORDER — METFORMIN HCL 500 MG PO TABS: ORAL_TABLET | ORAL | Status: DC

## 2013-09-15 ENCOUNTER — Ambulatory Visit (INDEPENDENT_AMBULATORY_CARE_PROVIDER_SITE_OTHER): Payer: BC Managed Care – PPO | Admitting: Sports Medicine

## 2013-09-15 ENCOUNTER — Ambulatory Visit (INDEPENDENT_AMBULATORY_CARE_PROVIDER_SITE_OTHER): Payer: BC Managed Care – PPO | Admitting: Physician Assistant

## 2013-09-15 ENCOUNTER — Encounter: Payer: Self-pay | Admitting: Physician Assistant

## 2013-09-15 VITALS — BP 149/93 | HR 80 | Wt 285.0 lb

## 2013-09-15 DIAGNOSIS — M170 Bilateral primary osteoarthritis of knee: Secondary | ICD-10-CM

## 2013-09-15 DIAGNOSIS — Q828 Other specified congenital malformations of skin: Secondary | ICD-10-CM

## 2013-09-15 DIAGNOSIS — L909 Atrophic disorder of skin, unspecified: Secondary | ICD-10-CM

## 2013-09-15 DIAGNOSIS — M1710 Unilateral primary osteoarthritis, unspecified knee: Secondary | ICD-10-CM

## 2013-09-15 DIAGNOSIS — IMO0002 Reserved for concepts with insufficient information to code with codable children: Secondary | ICD-10-CM

## 2013-09-15 DIAGNOSIS — I10 Essential (primary) hypertension: Secondary | ICD-10-CM

## 2013-09-15 NOTE — Progress Notes (Signed)
  Subjective:    Patient ID: Brandon Mckee, male    DOB: 29-Jun-1966, 47 y.o.   MRN: [id number]  HPI Pt comes in today to have skin tags removed and to follow up on HTN.   The patient wishes skin tag removed as the lesion is getting caught on clothing and seat belt and is recurrently irritated. Largest skin tags are in the axilla get caught on shirt as well as deodarant burns.   HTN- pt has taken BP medication today. Denies any HA's, CP, palpitations, SOB.    Review of Systems     Objective:   Physical Exam  Constitutional: He appears well-developed and well-nourished.  Cardiovascular: Normal rate, regular rhythm and normal heart sounds.   Skin:             Assessment & Plan:  Skin tags-  Skin Tag Removal Procedure Note  Pre-operative Diagnosis: Classic skin tags (acrochordon)  Post-operative Diagnosis: Classic skin tags (acrochordon)  Locations:around neck and left axilla.   Indications: irriation  Anesthesia: Lidocaine 1% without epinephrine without added sodium bicarbonate only used on larger skin tags  Procedure Details  The risks (including bleeding and infection) and benefits of the procedure and Verbal informed consent obtained. Using sterile iris scissors, multiple skin tags were snipped off at their bases after cleansing with Betadine.  Bleeding was controlled by pressure and aluminum as needed.  Findings: Pathognomonic benign lesions  not sent for pathological exam.  Condition: Stable  Complications: none.  Plan: 1. Instructed to keep the wounds dry and covered for 24-48h and clean thereafter. 2. Warning signs of infection were reviewed.   3. Recommended that the patient use OTC acetaminophen as needed for pain.   4.The patient is instructed to watch for signs of infection including erythema, pain,      purulent discharge, or crusting.  5. Follow up in 2 weeks.   HTN- increased norvasc to 10mg  daily. Follow up in 2 weeks.   Subjective:

## 2013-09-15 NOTE — Assessment & Plan Note (Signed)
Euflexxa and Depo-Medrol aspiration/injection into left knee. Return in one week for injection #2 of 3.

## 2013-09-15 NOTE — Patient Instructions (Addendum)
Increased to 10mg  daily.   Follow up for the rest of skin tag removal.

## 2013-09-15 NOTE — Progress Notes (Signed)
  Procedure: Real-time Ultrasound Guided aspiration/Injection of left knee Device: GE Logiq E  Verbal informed consent obtained.  Time-out conducted.  Noted no overlying erythema, induration, or other signs of local infection.  Skin prepped in a sterile fashion.  Local anesthesia: Topical Ethyl chloride.  With sterile technique and under real time ultrasound guidance:  22-gauge needle advanced into the suprapatellar recess, 40 cc of straw-colored clear fluid aspirated, syringe switched in 1 cc Depo-Medrol 40 injected along with 4 cc lidocaine, syringe again switched and 20 mg/2 mL of Euflexxa (sodium hyaluronate) in a prefilled syringe was injected easily into the knee through a 22-gauge needle. Completed without difficulty  Pain immediately resolved suggesting accurate placement of the medication.  Advised to call if fevers/chills, erythema, induration, drainage, or persistent bleeding.  Images permanently stored and available for review in the ultrasound unit.  Impression: Technically successful ultrasound guided injection.

## 2013-09-22 ENCOUNTER — Other Ambulatory Visit: Payer: Self-pay | Admitting: Physician Assistant

## 2013-09-22 ENCOUNTER — Telehealth: Payer: Self-pay | Admitting: *Deleted

## 2013-09-22 ENCOUNTER — Ambulatory Visit (INDEPENDENT_AMBULATORY_CARE_PROVIDER_SITE_OTHER): Payer: BC Managed Care – PPO | Admitting: Sports Medicine

## 2013-09-22 ENCOUNTER — Encounter: Payer: Self-pay | Admitting: Sports Medicine

## 2013-09-22 VITALS — BP 158/90 | HR 77 | Wt 282.0 lb

## 2013-09-22 DIAGNOSIS — E291 Testicular hypofunction: Secondary | ICD-10-CM

## 2013-09-22 DIAGNOSIS — M170 Bilateral primary osteoarthritis of knee: Secondary | ICD-10-CM

## 2013-09-22 DIAGNOSIS — R748 Abnormal levels of other serum enzymes: Secondary | ICD-10-CM

## 2013-09-22 DIAGNOSIS — IMO0002 Reserved for concepts with insufficient information to code with codable children: Secondary | ICD-10-CM

## 2013-09-22 DIAGNOSIS — M1710 Unilateral primary osteoarthritis, unspecified knee: Secondary | ICD-10-CM

## 2013-09-22 DIAGNOSIS — M1712 Unilateral primary osteoarthritis, left knee: Secondary | ICD-10-CM

## 2013-09-22 LAB — COMPREHENSIVE METABOLIC PANEL
AST: 35 U/L (ref 0–37)
Albumin: 4.1 g/dL (ref 3.5–5.2)
Alkaline Phosphatase: 132 U/L — ABNORMAL HIGH (ref 39–117)
BUN: 13 mg/dL (ref 6–23)
Creat: 0.89 mg/dL (ref 0.50–1.35)
Glucose, Bld: 111 mg/dL — ABNORMAL HIGH (ref 70–99)
Total Bilirubin: 0.9 mg/dL (ref 0.3–1.2)

## 2013-09-22 NOTE — Progress Notes (Signed)
  Procedure: Real-time Ultrasound Guided Injection of left knee Device: GE Logiq E  Verbal informed consent obtained.  Time-out conducted.  Noted no overlying erythema, induration, or other signs of local infection.  Skin prepped in a sterile fashion.  Local anesthesia: Topical Ethyl chloride.  With sterile technique and under real time ultrasound guidance:  20 mg/2 mL of Euflexxa (sodium hyaluronate) in a prefilled syringe was injected easily into the knee through a 22-gauge needle. Completed without difficulty  Pain immediately resolved suggesting accurate placement of the medication.  Advised to call if fevers/chills, erythema, induration, drainage, or persistent bleeding.  Images permanently stored and available for review in the ultrasound unit.  Impression: Technically successful ultrasound guided injection.

## 2013-09-22 NOTE — Assessment & Plan Note (Signed)
Injection #2 of 3. Return in one week for #3. Currently pain-free.

## 2013-09-25 LAB — TESTOSTERONE, FREE, TOTAL, SHBG
Sex Hormone Binding: 25 nmol/L (ref 13–71)
Testosterone, Free: 50.3 pg/mL (ref 47.0–244.0)
Testosterone-% Free: 2.2 % (ref 1.6–2.9)
Testosterone: 225 ng/dL — ABNORMAL LOW (ref 300–890)

## 2013-09-27 ENCOUNTER — Other Ambulatory Visit: Payer: Self-pay | Admitting: *Deleted

## 2013-09-27 LAB — FERRITIN: Ferritin: 315 ng/mL (ref 22–322)

## 2013-09-27 MED ORDER — CYCLOBENZAPRINE HCL 5 MG PO TABS: ORAL_TABLET | ORAL | Status: DC

## 2013-09-27 MED ORDER — GABAPENTIN 100 MG PO CAPS: ORAL_CAPSULE | ORAL | Status: DC

## 2013-09-29 ENCOUNTER — Ambulatory Visit (INDEPENDENT_AMBULATORY_CARE_PROVIDER_SITE_OTHER): Payer: BC Managed Care – PPO | Admitting: Sports Medicine

## 2013-09-29 ENCOUNTER — Encounter: Payer: Self-pay | Admitting: Sports Medicine

## 2013-09-29 VITALS — BP 126/81 | HR 75 | Wt 284.0 lb

## 2013-09-29 DIAGNOSIS — IMO0002 Reserved for concepts with insufficient information to code with codable children: Secondary | ICD-10-CM

## 2013-09-29 DIAGNOSIS — M1710 Unilateral primary osteoarthritis, unspecified knee: Secondary | ICD-10-CM

## 2013-09-29 DIAGNOSIS — M170 Bilateral primary osteoarthritis of knee: Secondary | ICD-10-CM

## 2013-09-29 NOTE — Assessment & Plan Note (Signed)
Euflexxa injection #3 of 3 into the left knee after aspirating 30 cc of straw-colored fluid. Return in one month. Mobic as needed.

## 2013-09-29 NOTE — Progress Notes (Signed)
   Procedure: Real-time Ultrasound Guided Injection of left knee Device: GE Logiq E  Verbal informed consent obtained.  Time-out conducted.  Noted no overlying erythema, induration, or other signs of local infection.  Skin prepped in a sterile fashion.  Local anesthesia: Topical Ethyl chloride.  With sterile technique and under real time ultrasound guidance:  20 mg/2 mL of Euflexxa (sodium hyaluronate) in a prefilled syringe was injected easily into the knee through a 22-gauge needle. Completed without difficulty  Pain immediately resolved suggesting accurate placement of the medication.  Advised to call if fevers/chills, erythema, induration, drainage, or persistent bleeding.  Images permanently stored and available for review in the ultrasound unit.  Impression: Technically successful ultrasound guided injection.  Knee was strapped with compressive dressing.

## 2013-09-30 LAB — HEPATITIS PANEL, ACUTE
HCV Ab: NEGATIVE
Hepatitis B Surface Ag: NEGATIVE

## 2013-10-11 ENCOUNTER — Encounter: Payer: Self-pay | Admitting: Physician Assistant

## 2013-10-11 ENCOUNTER — Other Ambulatory Visit: Payer: Self-pay | Admitting: Physician Assistant

## 2013-10-11 ENCOUNTER — Telehealth: Payer: Self-pay | Admitting: Physician Assistant

## 2013-10-11 DIAGNOSIS — G4733 Obstructive sleep apnea (adult) (pediatric): Secondary | ICD-10-CM | POA: Insufficient documentation

## 2013-10-11 NOTE — Telephone Encounter (Signed)
Patient advised.

## 2013-10-11 NOTE — Telephone Encounter (Signed)
Patient needs to be called and let know he has sleep apnea and we are referring him to [facility] to get cpap.

## 2013-10-13 ENCOUNTER — Telehealth: Payer: Self-pay | Admitting: Physician Assistant

## 2013-10-13 NOTE — Telephone Encounter (Signed)
Maurine Minister from [facility] called regarding referral placed for home health services.  He said their office will need an Order that includes pressure settings for C-Pap and physician's signature. He's expecting Order today if possble.

## 2013-10-13 NOTE — Telephone Encounter (Signed)
I don't have snap report anymore. So I will need that before I can.

## 2013-10-13 NOTE — Telephone Encounter (Signed)
Can we find out. I gave copies of test to Brandon Mckee.

## 2013-10-13 NOTE — Telephone Encounter (Signed)
Victorino Dike. I gave this to you to send. Do you still have. It was done by [facility].

## 2013-10-13 NOTE — Telephone Encounter (Signed)
[facility] needs the pressure settings written on the rx.

## 2013-10-23 ENCOUNTER — Other Ambulatory Visit: Payer: Self-pay | Admitting: Physician Assistant

## 2013-10-26 ENCOUNTER — Ambulatory Visit (INDEPENDENT_AMBULATORY_CARE_PROVIDER_SITE_OTHER): Payer: BC Managed Care – PPO | Admitting: Sports Medicine

## 2013-10-26 ENCOUNTER — Encounter: Payer: Self-pay | Admitting: Sports Medicine

## 2013-10-26 VITALS — BP 143/85 | HR 79 | Wt 281.0 lb

## 2013-10-26 DIAGNOSIS — M1710 Unilateral primary osteoarthritis, unspecified knee: Secondary | ICD-10-CM

## 2013-10-26 DIAGNOSIS — M170 Bilateral primary osteoarthritis of knee: Secondary | ICD-10-CM

## 2013-10-26 DIAGNOSIS — IMO0002 Reserved for concepts with insufficient information to code with codable children: Secondary | ICD-10-CM

## 2013-10-26 NOTE — Progress Notes (Signed)
  Subjective:    CC: Followup  HPI: Bilateral knee osteoarthritis: Just finished a Euflexxa series into his left knee, he returns pain-free. Happy with the results.  Past medical history, Surgical history, Family history not pertinant except as noted below, Social history, Allergies, and medications have been entered into the medical record, reviewed, and no changes needed.   Review of Systems: No fevers, chills, night sweats, weight loss, chest pain, or shortness of breath.   Objective:    General: Well Developed, well nourished, and in no acute distress.  Neuro: Alert and oriented x3, extra-ocular muscles intact, sensation grossly intact.  HEENT: Normocephalic, atraumatic, pupils equal round reactive to light, neck supple, no masses, no lymphadenopathy, thyroid nonpalpable.  Skin: Warm and dry, no rashes. Cardiac: Regular rate and rhythm, no murmurs rubs or gallops, no lower extremity edema.  Respiratory: Clear to auscultation bilaterally. Not using accessory muscles, speaking in full sentences. Left knee: Normal to inspection with no erythema or effusion or obvious bony abnormalities. Palpation normal with no warmth, joint line tenderness, patellar tenderness, or condyle tenderness. ROM full in flexion and extension and lower leg rotation. Ligaments with solid consistent endpoints including ACL, PCL, LCL, MCL. Negative Mcmurray's, Apley's, and Thessalonian tests. Non painful patellar compression. Patellar glide without crepitus. Patellar and quadriceps tendons unremarkable. Hamstring and quadriceps strength is normal.   Impression and Recommendations:

## 2013-10-26 NOTE — Assessment & Plan Note (Signed)
Returns pain-free after finishing a series of Euflexxa. Happy with the results. Adding some rehabilitation exercises. Return as needed, we can repeat a Euflexxa series in 6 months if needed.

## 2013-10-30 ENCOUNTER — Ambulatory Visit: Payer: BC Managed Care – PPO | Admitting: Sports Medicine

## 2013-11-07 ENCOUNTER — Encounter: Payer: Self-pay | Admitting: Physician Assistant

## 2013-12-11 ENCOUNTER — Encounter: Payer: Self-pay | Admitting: Family Medicine

## 2013-12-11 ENCOUNTER — Ambulatory Visit (INDEPENDENT_AMBULATORY_CARE_PROVIDER_SITE_OTHER): Payer: BC Managed Care – PPO | Admitting: Family Medicine

## 2013-12-11 VITALS — BP 129/77 | HR 96 | Temp 97.9°F | Wt 280.0 lb

## 2013-12-11 DIAGNOSIS — J040 Acute laryngitis: Secondary | ICD-10-CM

## 2013-12-11 MED ORDER — PREDNISONE 20 MG PO TABS: ORAL_TABLET | ORAL | Status: AC

## 2013-12-11 NOTE — Progress Notes (Signed)
CC: Brandon Mckee is a 47 y.o. male is here for Cough   Subjective: HPI:  Patient complains of chest pain that is localized low in the anterior throat described as itching which is moderate in severity nothing particularly makes better or worse. Has been causing coughing and hoarseness of voice. Symptoms began after he vomited after overeating at a holiday party, occurred on Thursday of last week. Symptoms have not been getting better or worsens onset despite using cough drops. Has been accompanied by a nonproductive cough. No other interventions. Denies shortness of breath, wheezing, fevers, chills, chest pain other than that described above. Denies dysphagia   Review Of Systems Outlined In HPI  Past Medical History  Diagnosis Date  . Hyperlipidemia   . Metabolic syndrome     IFG  . Hypertension   . Pes anserine bursitis     bilateral     Family History  Problem Relation Age of Onset  . Hyperlipidemia Mother   . Hypertension Mother   . Hypertension Father   . Hyperlipidemia Father   . Obesity Sister   . Hypertension Sister      History  Substance Use Topics  . Smoking status: Never Smoker   . Smokeless tobacco: Not on file  . Alcohol Use:      Objective: Filed Vitals:   12/11/13 1543  BP: 129/77  Pulse: 96  Temp: 97.9 F (36.6 C)    General: Alert and Oriented, No Acute Distress HEENT: Pupils equal, round, reactive to light. Conjunctivae clear.  External ears unremarkable, canals clear with intact TMs with appropriate landmarks.  Middle ear appears open without effusion. Pink inferior turbinates.  Moist mucous membranes, pharynx without inflammation nor lesions.  Neck supple without palpable lymphadenopathy nor abnormal masses. Lungs: Clear to auscultation bilaterally, no wheezing/ronchi/rales.  Comfortable work of breathing. Good air movement. Cardiac: Regular rate and rhythm. Normal S1/S2.  No murmurs, rubs, nor gallops.   Mental Status: No depression,  anxiety, nor agitation. Skin: Warm and dry.  Assessment & Plan: Brandon Mckee was seen today for cough.  Diagnoses and associated orders for this visit:  Laryngitis - predniSONE (DELTASONE) 20 MG tablet; Three tabs at once daily for five days.    No sign of bacterial infection, start prednisone for discomfort and cough.  Continue cough drops as needed  Return if symptoms worsen or fail to improve.

## 2013-12-12 ENCOUNTER — Ambulatory Visit: Payer: BC Managed Care – PPO | Admitting: Physician Assistant

## 2013-12-18 ENCOUNTER — Other Ambulatory Visit: Payer: Self-pay | Admitting: Physician Assistant

## 2013-12-27 ENCOUNTER — Other Ambulatory Visit: Payer: Self-pay | Admitting: *Deleted

## 2013-12-27 MED ORDER — AMLODIPINE BESYLATE 10 MG PO TABS: 10.0000 mg | ORAL_TABLET | Freq: Every day | ORAL | Status: DC

## 2014-01-19 ENCOUNTER — Other Ambulatory Visit: Payer: Self-pay | Admitting: Physician Assistant

## 2014-01-19 MED ORDER — AMBULATORY NON FORMULARY MEDICATION: Status: DC

## 2014-02-04 ENCOUNTER — Other Ambulatory Visit: Payer: Self-pay | Admitting: Physician Assistant

## 2014-05-16 ENCOUNTER — Other Ambulatory Visit: Payer: Self-pay | Admitting: Physician Assistant

## 2014-05-25 ENCOUNTER — Encounter: Payer: Self-pay | Admitting: Sports Medicine

## 2014-05-25 ENCOUNTER — Encounter: Payer: Self-pay | Admitting: Physician Assistant

## 2014-05-25 ENCOUNTER — Ambulatory Visit (INDEPENDENT_AMBULATORY_CARE_PROVIDER_SITE_OTHER): Payer: BC Managed Care – PPO | Admitting: Physician Assistant

## 2014-05-25 ENCOUNTER — Ambulatory Visit (INDEPENDENT_AMBULATORY_CARE_PROVIDER_SITE_OTHER): Payer: BC Managed Care – PPO | Admitting: Sports Medicine

## 2014-05-25 VITALS — BP 151/97 | HR 86 | Ht 71.0 in | Wt 281.0 lb

## 2014-05-25 VITALS — BP 151/97 | HR 86 | Wt 281.0 lb

## 2014-05-25 DIAGNOSIS — E291 Testicular hypofunction: Secondary | ICD-10-CM

## 2014-05-25 DIAGNOSIS — M170 Bilateral primary osteoarthritis of knee: Secondary | ICD-10-CM

## 2014-05-25 DIAGNOSIS — IMO0002 Reserved for concepts with insufficient information to code with codable children: Secondary | ICD-10-CM

## 2014-05-25 DIAGNOSIS — M1710 Unilateral primary osteoarthritis, unspecified knee: Secondary | ICD-10-CM

## 2014-05-25 DIAGNOSIS — E119 Type 2 diabetes mellitus without complications: Secondary | ICD-10-CM

## 2014-05-25 DIAGNOSIS — Z79899 Other long term (current) drug therapy: Secondary | ICD-10-CM

## 2014-05-25 DIAGNOSIS — I10 Essential (primary) hypertension: Secondary | ICD-10-CM

## 2014-05-25 LAB — COMPLETE METABOLIC PANEL WITH GFR
ALK PHOS: 128 U/L — AB (ref 39–117)
ALT: 76 U/L — AB (ref 0–53)
AST: 38 U/L — AB (ref 0–37)
Albumin: 4.2 g/dL (ref 3.5–5.2)
BILIRUBIN TOTAL: 0.6 mg/dL (ref 0.2–1.2)
BUN: 9 mg/dL (ref 6–23)
CO2: 31 mEq/L (ref 19–32)
CREATININE: 0.72 mg/dL (ref 0.50–1.35)
Calcium: 8.8 mg/dL (ref 8.4–10.5)
Chloride: 103 mEq/L (ref 96–112)
GFR, Est Non African American: >89 mL/min
Glucose, Bld: 122 mg/dL — ABNORMAL HIGH (ref 70–99)
Potassium: 3.4 mEq/L — ABNORMAL LOW (ref 3.5–5.3)
SODIUM: 142 meq/L (ref 135–145)
TOTAL PROTEIN: 6.3 g/dL (ref 6.0–8.3)

## 2014-05-25 LAB — HEMOGLOBIN A1C
Hgb A1c MFr Bld: 6.8 % — ABNORMAL HIGH (ref <5.7)
MEAN PLASMA GLUCOSE: 148 mg/dL — AB (ref <117)

## 2014-05-25 MED ORDER — TESTOSTERONE 50 MG/5GM (1%) TD GEL: 5.0000 g | Freq: Every day | TRANSDERMAL | Status: DC

## 2014-05-25 MED ORDER — LISINOPRIL-HYDROCHLOROTHIAZIDE 20-25 MG PO TABS: 1.0000 | ORAL_TABLET | Freq: Every day | ORAL | Status: DC

## 2014-05-25 MED ORDER — AMLODIPINE BESYLATE 10 MG PO TABS: ORAL_TABLET | ORAL | Status: DC

## 2014-05-25 NOTE — Assessment & Plan Note (Signed)
Nine-month response to Euflexxa series. Restarted viscous supplementation with steroid and Supartz today. Return in one week for injection #2 of 5.

## 2014-05-25 NOTE — Patient Instructions (Signed)
Start testosterone gel in morning. Follow up in one month.   Testosterone skin gel What is this medicine? TESTOSTERONE (tes TOS ter one) is the main male hormone. It supports normal male traits such as muscle growth, facial hair, and deep voice. This gel is used in males to treat low testosterone levels. This medicine may be used for other purposes; ask your health care provider or pharmacist if you have questions. COMMON BRAND NAME(S): AndroGel, FORTESTA, Testim What should I tell my health care provider before I take this medicine? They need to know if you have any of these conditions: -breast cancer -diabetes -heart disease -if a male partner is pregnant or trying to get pregnant -kidney disease -liver disease -lung disease -prostate cancer, enlargement -an unusual or allergic reaction to testosterone, soy proteins, other medicines, foods, dyes, or preservatives -pregnant or trying to get pregnant -breast-feeding How should I use this medicine? This medicine is for external use only. This medicine is applied at the same time every day (preferably in the morning) to clean, dry, intact skin. If you take a bath or shower in the morning, apply the gel after the bath or shower. Follow the directions on the prescription label. Make sure that you are using your testosterone gel product correctly and applying it only to the appropriate skin area (see below). Allow the skin to dry a few minutes then cover with clothing to prevent others from coming in contact with the medicine on your skin. The gel is flammable. Avoid fire, flame, or smoking until the gel has dried. Wash your hands with soap and water after use. For AndroGel Packets: Open the packet(s) needed for your dose. You can put the entire dose into your palm all at once or just a little at a time to apply. If you prefer, you can instead squeeze the gel directly onto the area you are applying it to. Apply on the shoulders, upper arm, or  abdomen as directed. Do not apply to the scrotum or genitals. Be sure you use the correct total dose. It is best to wait 5 to 6 hours after application of the gel before showering or swimming. For AndroGel 1%: Pump the dose into the palm of your hand. You can put the entire dose into your palm all at once or just a little at a time to apply. If you prefer, you can instead pump the gel directly onto the area you are applying it to. Apply on the shoulders, upper arm, or abdomen as directed. Do not apply to the scrotum or genitals. Be sure you use the correct total dose. It is best to wait for 5 to 6 hours after application of the gel before showering or swimming. For AndroGel 1.62%: Pump the dose into the palm of your hand. Dispense one pump of gel at a time into the palm of your hand before applying it. If you prefer, you can instead pump the gel directly onto the area you are applying it to. Apply on the shoulders and upper arms as directed. Do not apply to other parts of the body including the abdomen or genitals. Be sure you use the correct total dose. It is best to wait 2 hours after application of the gel before washing, showering, or swimming. For Testim: Open the tube(s) needed for your dose. Squeeze the gel from the tube into the palm of your hand. Apply on the shoulders or upper arms as directed. Do not apply to the scrotum, genitals, or  abdomen. Be sure you use the correct total dose. Do not shower or swim for at least 2 hours after application of the gel. For Fortesta: Use the multi-dose pump to pump the gel directly onto the area you are applying it to. Apply on the thighs as directed. Do not apply to the abdomen, penis, scrotum, shoulders or upper arms. Gently rub the gel onto the skin using your finger. Be sure you use the correct total dose. Do not shower or swim for at least 2 hours after application of the gel. A special MedGuide will be given to you by the pharmacist with each prescription and  refill. Be sure to read this information carefully each time. Talk to your pediatrician regarding the use of this medicine in children. Special care may be needed. Overdosage: If you think you have taken too much of this medicine contact a poison control center or emergency room at once. NOTE: This medicine is only for you. Do not share this medicine with others. What if I miss a dose? If you miss a dose, use it as soon as you can. If it is almost time for your next dose, use only that dose. Do not use double or extra doses. What may interact with this medicine? -medicines for diabetes -medicines that treat or prevent blood clots like warfarin -oxyphenbutazone -propranolol -steroid medicines like prednisone or cortisone This list may not describe all possible interactions. Give your health care provider a list of all the medicines, herbs, non-prescription drugs, or dietary supplements you use. Also tell them if you smoke, drink alcohol, or use illegal drugs. Some items may interact with your medicine. What should I watch for while using this medicine? Visit your doctor or health care professional for regular checks on your progress. They will need to check the level of testosterone in your blood. This medicine can transfer from your body to others. If a person or pet comes in contact with the area where this medicine was applied to your skin, they may have a serious risk of side effects. If you cannot avoid skin-to-skin contact with another person, make sure the site where this medicine was applied is covered with clothing. If accidental contact happens, the skin of the person or pet should be washed right away with soap and water. Also, a male partner who is pregnant or trying to get pregnant should avoid contact with the gel or treated skin. This medicine may affect blood sugar levels. If you have diabetes, check with your doctor or health care professional before you change your diet or the dose  of your diabetic medicine. This drug is banned from use in athletes by most athletic organizations. What side effects may I notice from receiving this medicine? Side effects that you should report to your doctor or health care professional as soon as possible: -allergic reactions like skin rash, itching or hives, swelling of the face, lips, or tongue -breast enlargement -breathing problems -changes in mood, especially anger, depression, or rage -dark urine -general ill feeling or flu-like symptoms -light-colored stools -loss of appetite, nausea -nausea, vomiting -right upper belly pain -stomach pain -swelling of ankles -too frequent or persistent erections -trouble passing urine or change in the amount of urine -unusually weak or tired -yellowing of the eyes or skin Side effects that usually do not require medical attention (report to your doctor or health care professional if they continue or are bothersome): -acne -change in sex drive or performance -hair loss -headache This list  may not describe all possible side effects. Call your doctor for medical advice about side effects. You may report side effects to FDA at 1-800-FDA-1088. Where should I keep my medicine? Keep out of the reach of children. This medicine can be abused. Keep your medicine in a safe place to protect it from theft. Do not share this medicine with anyone. Selling or giving away this medicine is dangerous and against the law. Store at room temperature between 15 to 30 degrees C (59 to 86 degrees F). Keep closed until use. Protect from heat and light. This medicine is flammable. Avoid exposure to heat, fire, flame, and smoking. Throw away any unused medicine after the expiration date. NOTE: This sheet is a summary. It may not cover all possible information. If you have questions about this medicine, talk to your doctor, pharmacist, or health care provider.  2014, Elsevier/Gold Standard. (2010-04-21 16:45:50)

## 2014-05-25 NOTE — Progress Notes (Signed)
   Subjective:    Patient ID: Brandon Mckee, male    DOB: April 05, 1966, 48 y.o.   MRN: [id number]  HPI  patient is a 48 year old male who presents to the clinic to followup on hypertension and to get medications refilled.  Hypertension-patient did not take his medication today. He reports checking his blood pressure at home and ranging in the high 120s over 80s and mid-130s over 80s. He denies any chest pains, shortness of breath, vision changes or headaches. He continues to take his medication daily.  DM- last A1c was 6.8. Patient was placed on metformin which he does take daily, twice a day. Patient is not taking his sugars. He denies any hypoglycemic events. He has not made any diet changes and is not regularly exercising.    Review of Systems  All other systems reviewed and are negative.      Objective:   Physical Exam  Constitutional: He is oriented to person, place, and time. He appears well-developed and well-nourished.  HENT:  Head: Normocephalic and atraumatic.  Cardiovascular: Normal rate, regular rhythm and normal heart sounds.   Pulmonary/Chest: Effort normal and breath sounds normal.  Neurological: He is alert and oriented to person, place, and time.  Skin: Skin is dry.  Psychiatric: He has a normal mood and affect. His behavior is normal.          Assessment & Plan:  Hypertension- will recheck blood pressure one month. Encouraged patient to be taking his blood pressure medicine when we check it. Refilled medications today.  DM type II- since strong blood today we'll recheck A1c with blood draw. Continue on metformin until other instructions are given. Discussed need for pneumonia vaccine. Patient is aware and will consider at another visit. Encouraged patient to consider diet changes to decrease sugar level and body as well as regular exercise. 33months next A1c.     Male hypogonadism-discuss he had 2 readings of low testosterone and nothing was ever done  about it. He could certainly try replacement and see if that gives him some energy and makes him feel better. Patient was on board with this. AndroGel was started daily. Patient will followup in one month. Patient was given handout with side effects and risk factors. Pt encouraged to keep in a safe place and away from women in his family.  PSA was normal in 2014. Will recheck in 1 month. CMP were ordered today.

## 2014-05-25 NOTE — Progress Notes (Signed)
  Subjective:    CC: Knee osteoarthritis  HPI: Brandon Mckee is a very pleasant 48 year old male, he has bilateral knee osteoarthritis. Approximately 9 months ago we did a series of Euflexxa into his left knee. He had a fantastic response and desires repeat interventional treatment. Pain is moderate, persistent, and the left knee both joint lines, worse in the mornings, and worse with weightbearing.  Past medical history, Surgical history, Family history not pertinant except as noted below, Social history, Allergies, and medications have been entered into the medical record, reviewed, and no changes needed.   Review of Systems: No fevers, chills, night sweats, weight loss, chest pain, or shortness of breath.   Objective:    General: Well Developed, well nourished, and in no acute distress.  Neuro: Alert and oriented x3, extra-ocular muscles intact, sensation grossly intact.  HEENT: Normocephalic, atraumatic, pupils equal round reactive to light, neck supple, no masses, no lymphadenopathy, thyroid nonpalpable.  Skin: Warm and dry, no rashes. Cardiac: Regular rate and rhythm, no murmurs rubs or gallops, no lower extremity edema.  Respiratory: Clear to auscultation bilaterally. Not using accessory muscles, speaking in full sentences. Left Knee: Normal to inspection with no erythema or effusion or obvious bony abnormalities. Tender to palpation at the joint lines. ROM full in flexion and extension and lower leg rotation. Ligaments with solid consistent endpoints including ACL, PCL, LCL, MCL. Negative Mcmurray's, Apley's, and Thessalonian tests. Non painful patellar compression. Patellar glide without crepitus. Patellar and quadriceps tendons unremarkable. Hamstring and quadriceps strength is normal.   Procedure: Real-time Ultrasound Guided Injection of left knee Device: GE Logiq E  Verbal informed consent obtained.  Time-out conducted.  Noted no overlying erythema, induration, or other signs  of local infection.  Skin prepped in a sterile fashion.  Local anesthesia: Topical Ethyl chloride.  With sterile technique and under real time ultrasound guidance:  2 cc Kenalog 40, 4 cc lidocaine injected easily into the suprapatellar recess, syringe switched and 25 mg/2.5 mL of Supartz (sodium hyaluronate) in a prefilled syringe was injected easily into the knee through a 22-gauge needle. Completed without difficulty  Pain immediately resolved suggesting accurate placement of the medication.  Advised to call if fevers/chills, erythema, induration, drainage, or persistent bleeding.  Images permanently stored and available for review in the ultrasound unit.  Impression: Technically successful ultrasound guided injection.  Impression and Recommendations:

## 2014-06-01 ENCOUNTER — Ambulatory Visit (INDEPENDENT_AMBULATORY_CARE_PROVIDER_SITE_OTHER): Payer: BC Managed Care – PPO | Admitting: Sports Medicine

## 2014-06-01 ENCOUNTER — Encounter: Payer: Self-pay | Admitting: Sports Medicine

## 2014-06-01 VITALS — BP 127/77 | HR 86 | Ht 71.0 in | Wt 281.0 lb

## 2014-06-01 DIAGNOSIS — IMO0002 Reserved for concepts with insufficient information to code with codable children: Secondary | ICD-10-CM

## 2014-06-01 DIAGNOSIS — M170 Bilateral primary osteoarthritis of knee: Secondary | ICD-10-CM

## 2014-06-01 DIAGNOSIS — M1710 Unilateral primary osteoarthritis, unspecified knee: Secondary | ICD-10-CM

## 2014-06-01 NOTE — Progress Notes (Signed)
    Procedure: Real-time Ultrasound Guided Injection of left knee Device: GE Logiq E  Verbal informed consent obtained.  Time-out conducted.  Noted no overlying erythema, induration, or other signs of local infection.  Skin prepped in a sterile fashion.  Local anesthesia: Topical Ethyl chloride.  With sterile technique and under real time ultrasound guidance: 25 mg/2.5 mL of Supartz (sodium hyaluronate) in a prefilled syringe was injected easily into the knee through a 22-gauge needle. Completed without difficulty  Pain immediately resolved suggesting accurate placement of the medication.  Advised to call if fevers/chills, erythema, induration, drainage, or persistent bleeding.  Images permanently stored and available for review in the ultrasound unit.  Impression: Technically successful ultrasound guided injection. 

## 2014-06-01 NOTE — Assessment & Plan Note (Signed)
Injection #2 in the left knee. Return in one week for #3. Already feeling a little bit better.

## 2014-06-07 ENCOUNTER — Telehealth: Payer: Self-pay | Admitting: *Deleted

## 2014-06-07 DIAGNOSIS — E291 Testicular hypofunction: Secondary | ICD-10-CM

## 2014-06-07 NOTE — Telephone Encounter (Signed)
PA required for androgel. Pt getting lab due to [place] testosterone lab was 09/2013.  Oscar La, LPN

## 2014-06-08 ENCOUNTER — Ambulatory Visit (INDEPENDENT_AMBULATORY_CARE_PROVIDER_SITE_OTHER): Payer: BC Managed Care – PPO | Admitting: Sports Medicine

## 2014-06-08 ENCOUNTER — Encounter: Payer: Self-pay | Admitting: Sports Medicine

## 2014-06-08 VITALS — BP 145/94 | HR 82 | Ht 71.0 in | Wt 282.0 lb

## 2014-06-08 DIAGNOSIS — M1710 Unilateral primary osteoarthritis, unspecified knee: Secondary | ICD-10-CM

## 2014-06-08 DIAGNOSIS — M170 Bilateral primary osteoarthritis of knee: Secondary | ICD-10-CM

## 2014-06-08 NOTE — Assessment & Plan Note (Signed)
Right knee is somewhat swollen but essentially pain-free. Supartz injection #3 into the left knee as above. Return in one week for #4

## 2014-06-08 NOTE — Progress Notes (Signed)
  Procedure: Real-time Ultrasound Guided Injection of left knee Device: GE Logiq E  Verbal informed consent obtained.  Time-out conducted.  Noted no overlying erythema, induration, or other signs of local infection.  Skin prepped in a sterile fashion.  Local anesthesia: Topical Ethyl chloride.  With sterile technique and under real time ultrasound guidance: 25 mg/2.5 mL of Supartz (sodium hyaluronate) in a prefilled syringe was injected easily into the knee through a 22-gauge needle. Completed without difficulty  Pain immediately resolved suggesting accurate placement of the medication.  Advised to call if fevers/chills, erythema, induration, drainage, or persistent bleeding.  Images permanently stored and available for review in the ultrasound unit.  Impression: Technically successful ultrasound guided injection. 

## 2014-06-11 LAB — TESTOSTERONE, FREE, TOTAL, SHBG
SEX HORMONE BINDING: 23 nmol/L (ref 13–71)
TESTOSTERONE FREE: 37.7 pg/mL — AB (ref 47.0–244.0)
TESTOSTERONE: 165 ng/dL — AB (ref 300–890)
Testosterone-% Free: 2.3 % (ref 1.6–2.9)

## 2014-06-15 ENCOUNTER — Telehealth: Payer: Self-pay | Admitting: *Deleted

## 2014-06-15 ENCOUNTER — Ambulatory Visit (INDEPENDENT_AMBULATORY_CARE_PROVIDER_SITE_OTHER): Payer: BC Managed Care – PPO | Admitting: Sports Medicine

## 2014-06-15 ENCOUNTER — Encounter: Payer: Self-pay | Admitting: Sports Medicine

## 2014-06-15 VITALS — BP 139/81 | HR 87 | Ht 71.0 in | Wt 280.0 lb

## 2014-06-15 DIAGNOSIS — M1710 Unilateral primary osteoarthritis, unspecified knee: Secondary | ICD-10-CM

## 2014-06-15 DIAGNOSIS — M170 Bilateral primary osteoarthritis of knee: Secondary | ICD-10-CM

## 2014-06-15 NOTE — Assessment & Plan Note (Signed)
Right knee is somewhat swollen but essentially pain-free. Supartz injection #4 into the left knee as above. Return in one week for #5

## 2014-06-15 NOTE — Telephone Encounter (Signed)
Patient notified testosterone approved. Margette Fast, CMA

## 2014-06-15 NOTE — Progress Notes (Signed)
  Procedure: Real-time Ultrasound Guided Injection of left knee Device: GE Logiq E  Verbal informed consent obtained.  Time-out conducted.  Noted no overlying erythema, induration, or other signs of local infection.  Skin prepped in a sterile fashion.  Local anesthesia: Topical Ethyl chloride.  With sterile technique and under real time ultrasound guidance: 25 mg/2.5 mL of Supartz (sodium hyaluronate) in a prefilled syringe was injected easily into the knee through a 22-gauge needle. Completed without difficulty  Pain immediately resolved suggesting accurate placement of the medication.  Advised to call if fevers/chills, erythema, induration, drainage, or persistent bleeding.  Images permanently stored and available for review in the ultrasound unit.  Impression: Technically successful ultrasound guided injection. 

## 2014-06-21 ENCOUNTER — Encounter: Payer: Self-pay | Admitting: Sports Medicine

## 2014-06-21 ENCOUNTER — Ambulatory Visit (INDEPENDENT_AMBULATORY_CARE_PROVIDER_SITE_OTHER): Payer: BC Managed Care – PPO | Admitting: Sports Medicine

## 2014-06-21 ENCOUNTER — Other Ambulatory Visit: Payer: Self-pay | Admitting: Physician Assistant

## 2014-06-21 VITALS — BP 150/93 | HR 101 | Ht 71.0 in | Wt 279.0 lb

## 2014-06-21 DIAGNOSIS — M170 Bilateral primary osteoarthritis of knee: Secondary | ICD-10-CM

## 2014-06-21 DIAGNOSIS — M1710 Unilateral primary osteoarthritis, unspecified knee: Secondary | ICD-10-CM

## 2014-06-21 NOTE — Progress Notes (Signed)
  Procedure: Real-time Ultrasound Guided Injection of left knee Device: GE Logiq E  Verbal informed consent obtained.  Time-out conducted.  Noted no overlying erythema, induration, or other signs of local infection.  Skin prepped in a sterile fashion.  Local anesthesia: Topical Ethyl chloride.  With sterile technique and under real time ultrasound guidance: 25 mg/2.5 mL of Supartz (sodium hyaluronate) in a prefilled syringe was injected easily into the knee through a 22-gauge needle. Completed without difficulty  Pain immediately resolved suggesting accurate placement of the medication.  Advised to call if fevers/chills, erythema, induration, drainage, or persistent bleeding.  Images permanently stored and available for review in the ultrasound unit.  Impression: Technically successful ultrasound guided injection. 

## 2014-06-21 NOTE — Assessment & Plan Note (Signed)
Doing significantly better than before we started the series of Visco supplementation. This is his second series, the first was with Euflexxa, the second with Supartz. Overall doing well, if continues to have any pain, he would be a candidate for operative intervention.

## 2014-06-26 ENCOUNTER — Other Ambulatory Visit: Payer: Self-pay | Admitting: *Deleted

## 2014-06-26 MED ORDER — GABAPENTIN 100 MG PO CAPS: ORAL_CAPSULE | ORAL | Status: DC

## 2014-07-19 ENCOUNTER — Other Ambulatory Visit: Payer: Self-pay | Admitting: *Deleted

## 2014-07-19 MED ORDER — ATORVASTATIN CALCIUM 40 MG PO TABS: ORAL_TABLET | ORAL | Status: DC

## 2014-07-24 ENCOUNTER — Other Ambulatory Visit: Payer: Self-pay | Admitting: Physician Assistant

## 2014-09-07 ENCOUNTER — Encounter: Payer: Self-pay | Admitting: Physician Assistant

## 2014-09-07 ENCOUNTER — Ambulatory Visit (INDEPENDENT_AMBULATORY_CARE_PROVIDER_SITE_OTHER): Payer: BC Managed Care – PPO | Admitting: Physician Assistant

## 2014-09-07 VITALS — BP 152/79 | HR 92 | Ht 71.0 in | Wt 287.0 lb

## 2014-09-07 DIAGNOSIS — E8881 Metabolic syndrome: Secondary | ICD-10-CM | POA: Diagnosis not present

## 2014-09-07 DIAGNOSIS — E291 Testicular hypofunction: Secondary | ICD-10-CM

## 2014-09-07 DIAGNOSIS — E785 Hyperlipidemia, unspecified: Secondary | ICD-10-CM

## 2014-09-07 DIAGNOSIS — Z23 Encounter for immunization: Secondary | ICD-10-CM | POA: Diagnosis not present

## 2014-09-07 DIAGNOSIS — K219 Gastro-esophageal reflux disease without esophagitis: Secondary | ICD-10-CM | POA: Diagnosis not present

## 2014-09-07 LAB — POCT GLYCOSYLATED HEMOGLOBIN (HGB A1C): HEMOGLOBIN A1C: 7.1

## 2014-09-07 MED ORDER — METFORMIN HCL 1000 MG PO TABS: 1000.0000 mg | ORAL_TABLET | Freq: Two times a day (BID) | ORAL | Status: DC

## 2014-09-07 MED ORDER — ATORVASTATIN CALCIUM 40 MG PO TABS: ORAL_TABLET | ORAL | Status: DC

## 2014-09-07 MED ORDER — OMEPRAZOLE 40 MG PO CPDR: 40.0000 mg | DELAYED_RELEASE_CAPSULE | Freq: Every day | ORAL | Status: DC

## 2014-09-07 NOTE — Patient Instructions (Signed)
Start omeprazole daily. Give 2 weeks follow up if not improving.

## 2014-09-10 NOTE — Progress Notes (Signed)
   Subjective:    Patient ID: Brandon Mckee, male    DOB: 1966/08/13, 48 y.o.   MRN: [id number]  HPI Pt presents to the clinic with worsening GERD to and get medication refills.   GERD- not currently on any medication. Has been in the past but stopped. Admits to drinking a lot of sodas and eating anything he wants. Describes burning sensation that causes him to cough up stuff every time he eats.   dysmetabolic syndrome/hyperlipidemia- needs refills.   HTN- doing well. No concerns or complaints. No CP, palpitations, vision changes or headaches.   Hypogonadism- needs refill.   Review of Systems  All other systems reviewed and are negative.      Objective:   Physical Exam  Constitutional: He is oriented to person, place, and time. He appears well-developed and well-nourished.  HENT:  Head: Normocephalic and atraumatic.  Cardiovascular: Normal rate, regular rhythm and normal heart sounds.   Pulmonary/Chest: Effort normal and breath sounds normal. He has no wheezes.  Neurological: He is alert and oriented to person, place, and time.  Skin: Skin is dry.  Psychiatric: He has a normal mood and affect. His behavior is normal.          Assessment & Plan:  GERD- start omeprazole 40mg  in the am. Follow up if symptoms not improving in next 2 weeks.   dysmetabolic syndromeDM type II-.[name] Lab Results  Component Value Date   HGBA1C 7.1 09/07/2014   continues to keep rising. Discussed how diet plays a big role. He is really not willing to stop eating what he wants. Recheck in 3 months. Continue on metformin. If not improving or worsening looking at adding another medication. Pt in a hurry today will do DM follow up with mirco, foot exam next visit. Reminder to get yearly eye exam.   Flu shot given without complication.   HTN- medications refilled. BP a little high today but just took meds before arrival. Will continue to monitor.   Hypogonadism- doing well. Refilled. Will get labs  today.   Hyperlipidemia- will recheck fastings labs. Refilled statin.

## 2014-09-11 LAB — CBC WITH DIFFERENTIAL/PLATELET
Basophils Absolute: 0 10*3/uL (ref 0.0–0.1)
Basophils Relative: 0 % (ref 0–1)
Eosinophils Absolute: 0.1 10*3/uL (ref 0.0–0.7)
Eosinophils Relative: 1 % (ref 0–5)
HEMATOCRIT: 40.4 % (ref 39.0–52.0)
HEMOGLOBIN: 14.2 g/dL (ref 13.0–17.0)
LYMPHS ABS: 1.3 10*3/uL (ref 0.7–4.0)
Lymphocytes Relative: 17 % (ref 12–46)
MCH: 30.3 pg (ref 26.0–34.0)
MCHC: 35.1 g/dL (ref 30.0–36.0)
MCV: 86.3 fL (ref 78.0–100.0)
MONOS PCT: 6 % (ref 3–12)
Monocytes Absolute: 0.5 10*3/uL (ref 0.1–1.0)
NEUTROS ABS: 5.7 10*3/uL (ref 1.7–7.7)
Neutrophils Relative %: 76 % (ref 43–77)
Platelets: 234 10*3/uL (ref 150–400)
RBC: 4.68 MIL/uL (ref 4.22–5.81)
RDW: 14.1 % (ref 11.5–15.5)
WBC: 7.5 10*3/uL (ref 4.0–10.5)

## 2014-09-11 LAB — COMPLETE METABOLIC PANEL WITH GFR
ALBUMIN: 4.2 g/dL (ref 3.5–5.2)
ALK PHOS: 108 U/L (ref 39–117)
ALT: 64 U/L — AB (ref 0–53)
AST: 44 U/L — ABNORMAL HIGH (ref 0–37)
BUN: 9 mg/dL (ref 6–23)
CO2: 28 meq/L (ref 19–32)
Calcium: 8.7 mg/dL (ref 8.4–10.5)
Chloride: 104 mEq/L (ref 96–112)
Creat: 0.7 mg/dL (ref 0.50–1.35)
GLUCOSE: 146 mg/dL — AB (ref 70–99)
POTASSIUM: 3.6 meq/L (ref 3.5–5.3)
SODIUM: 142 meq/L (ref 135–145)
TOTAL PROTEIN: 6.6 g/dL (ref 6.0–8.3)
Total Bilirubin: 0.6 mg/dL (ref 0.2–1.2)

## 2014-09-11 LAB — LIPID PANEL
Cholesterol: 137 mg/dL (ref 0–200)
HDL: 25 mg/dL — ABNORMAL LOW (ref >39)
LDL CALC: 85 mg/dL (ref 0–99)
Total CHOL/HDL Ratio: 5.5 Ratio
Triglycerides: 133 mg/dL (ref <150)
VLDL: 27 mg/dL (ref 0–40)

## 2014-09-11 LAB — TESTOSTERONE, FREE, TOTAL, SHBG
Sex Hormone Binding: 25 nmol/L (ref 13–71)
TESTOSTERONE-% FREE: 2.2 % (ref 1.6–2.9)
Testosterone, Free: 45 pg/mL — ABNORMAL LOW (ref 47.0–244.0)
Testosterone: 203 ng/dL — ABNORMAL LOW (ref 300–890)

## 2014-09-13 ENCOUNTER — Encounter: Payer: Self-pay | Admitting: Physician Assistant

## 2014-09-14 NOTE — Telephone Encounter (Signed)
Please call pt and wife- yes i think testosterone could be causing these symptoms. Please call insurance and see what kind of testosterone replacement they will cover and I will send to try. Also is he using CPAP. Sleep apnea can also cause some of these symptoms.

## 2014-09-20 ENCOUNTER — Other Ambulatory Visit: Payer: Self-pay | Admitting: Physician Assistant

## 2014-10-12 ENCOUNTER — Telehealth: Payer: Self-pay | Admitting: *Deleted

## 2014-10-12 NOTE — Telephone Encounter (Signed)
Pt stopped by the office to let us know that his ins will cover "testosterone cypionate".

## 2014-10-12 NOTE — Telephone Encounter (Signed)
That is the shot. Let's get him scheduled for testosterone shot 212mcg every 2 weeks then recheck levels mid week before next shot after one month.

## 2014-10-17 ENCOUNTER — Encounter: Payer: Self-pay | Admitting: Physician Assistant

## 2014-10-18 NOTE — Telephone Encounter (Signed)
Pt needs to be started on 200mg  of testosterone shots every 2 weeks then recheck in 6  Weeks in between shots. I felt like sent note previously to get these started but then got a mychart msg.

## 2014-10-21 ENCOUNTER — Other Ambulatory Visit: Payer: Self-pay | Admitting: Physician Assistant

## 2014-10-22 NOTE — Telephone Encounter (Signed)
Pt notified & transferred to scheduling. 

## 2014-10-26 ENCOUNTER — Ambulatory Visit (INDEPENDENT_AMBULATORY_CARE_PROVIDER_SITE_OTHER): Payer: BC Managed Care – PPO | Admitting: Family Medicine

## 2014-10-26 VITALS — BP 166/94 | HR 81 | Ht 71.0 in | Wt 281.0 lb

## 2014-10-26 DIAGNOSIS — E291 Testicular hypofunction: Secondary | ICD-10-CM | POA: Diagnosis not present

## 2014-10-26 MED ORDER — TESTOSTERONE CYPIONATE 200 MG/ML IM SOLN
200.0000 mg | Freq: Once | INTRAMUSCULAR | Status: AC
  Administered 2014-10-26: 200 mg via INTRAMUSCULAR

## 2014-10-26 NOTE — Progress Notes (Signed)
   Subjective:    Patient ID: Brandon Mckee, male    DOB: August 19, 1966, 48 y.o.   MRN: [id number]  HPI  Leroy reported to office today for his initial testosterone injection which he rec'd without complication in his LUOQ. Prior to injection he made me aware of near syncopal episodes in the past when getting injections. I then placed him on the bed and he was on his right side while injection was given. No complications. He was made aware of possible side effects and when he was to have blood work which will be due on December 11 and that will be 1 week after 3rd injection. He voiced understanding. Margette Fast, CMA    Review of Systems     Objective:   Physical Exam        Assessment & Plan:

## 2014-11-04 ENCOUNTER — Other Ambulatory Visit: Payer: Self-pay | Admitting: Physician Assistant

## 2014-11-09 ENCOUNTER — Ambulatory Visit (INDEPENDENT_AMBULATORY_CARE_PROVIDER_SITE_OTHER): Payer: BC Managed Care – PPO | Admitting: Physician Assistant

## 2014-11-09 VITALS — BP 139/86 | HR 96 | Wt 283.0 lb

## 2014-11-09 DIAGNOSIS — E291 Testicular hypofunction: Secondary | ICD-10-CM | POA: Diagnosis not present

## 2014-11-09 MED ORDER — TESTOSTERONE CYPIONATE 100 MG/ML IM SOLN
200.0000 mg | Freq: Once | INTRAMUSCULAR | Status: AC
Start: 2014-11-09 — End: 2014-11-09
  Administered 2014-11-09: 200 mg via INTRAMUSCULAR

## 2014-11-09 NOTE — Progress Notes (Signed)
   Subjective:    Patient ID: Brandon Mckee, male    DOB: Jan 06, 1966, 48 y.o.   MRN: [id number]  HPI  Brandon Mckee reports today for testosterone injection which he rec'd without complication. This is his second shot and he said that he feels a little bit "sluggish" the past few days but no other complaints. Margette Fast, CMA    Review of Systems     Objective:   Physical Exam        Assessment & Plan:

## 2014-11-23 ENCOUNTER — Ambulatory Visit (INDEPENDENT_AMBULATORY_CARE_PROVIDER_SITE_OTHER): Payer: BC Managed Care – PPO | Admitting: Physician Assistant

## 2014-11-23 VITALS — BP 134/78 | HR 77 | Wt 286.0 lb

## 2014-11-23 DIAGNOSIS — E291 Testicular hypofunction: Secondary | ICD-10-CM

## 2014-11-23 MED ORDER — TESTOSTERONE CYPIONATE 200 MG/ML IM SOLN
200.0000 mg | Freq: Once | INTRAMUSCULAR | Status: AC
  Administered 2014-11-23: 200 mg via INTRAMUSCULAR

## 2014-11-23 NOTE — Progress Notes (Signed)
   Subjective:    Patient ID: Brandon Mckee, male    DOB: 05-16-1966, 48 y.o.   MRN: [id number]  HPI  Heston Widener Divita is here for a testosterone injection. Denies chest pain, shortness of breath, headaches or mood changes.   Review of Systems     Objective:   Physical Exam        Assessment & Plan:  Patient tolerated injection well without complications. Patient advised to schedule next injection 14 days from today. Patient advised to return in 1 week for testosterone level check.

## 2014-12-03 ENCOUNTER — Telehealth: Payer: Self-pay

## 2014-12-03 DIAGNOSIS — E291 Testicular hypofunction: Secondary | ICD-10-CM

## 2014-12-03 NOTE — Telephone Encounter (Signed)
Sent order for testosterone to lab.

## 2014-12-04 LAB — TESTOSTERONE, FREE, TOTAL, SHBG
SEX HORMONE BINDING: 20 nmol/L (ref 13–71)
TESTOSTERONE FREE: 66.2 pg/mL (ref 47.0–244.0)
TESTOSTERONE-% FREE: 2.5 % (ref 1.6–2.9)
Testosterone: 263 ng/dL — ABNORMAL LOW (ref 300–890)

## 2014-12-07 ENCOUNTER — Ambulatory Visit (INDEPENDENT_AMBULATORY_CARE_PROVIDER_SITE_OTHER): Payer: BC Managed Care – PPO | Admitting: Physician Assistant

## 2014-12-07 ENCOUNTER — Encounter: Payer: Self-pay | Admitting: Physician Assistant

## 2014-12-07 VITALS — BP 135/81 | HR 76 | Ht 71.0 in | Wt 287.0 lb

## 2014-12-07 DIAGNOSIS — E291 Testicular hypofunction: Secondary | ICD-10-CM

## 2014-12-07 DIAGNOSIS — E119 Type 2 diabetes mellitus without complications: Secondary | ICD-10-CM | POA: Diagnosis not present

## 2014-12-07 DIAGNOSIS — Z23 Encounter for immunization: Secondary | ICD-10-CM

## 2014-12-07 DIAGNOSIS — R7303 Prediabetes: Secondary | ICD-10-CM | POA: Insufficient documentation

## 2014-12-07 DIAGNOSIS — E8881 Metabolic syndrome: Secondary | ICD-10-CM

## 2014-12-07 LAB — POCT GLYCOSYLATED HEMOGLOBIN (HGB A1C): HEMOGLOBIN A1C: 7.4

## 2014-12-07 MED ORDER — DAPAGLIFLOZIN PRO-METFORMIN ER 10-1000 MG PO TB24: 1.0000 | ORAL_TABLET | Freq: Every day | ORAL | Status: DC

## 2014-12-07 MED ORDER — TESTOSTERONE CYPIONATE 200 MG/ML IM SOLN
200.0000 mg | Freq: Once | INTRAMUSCULAR | Status: AC
  Administered 2014-12-07: 200 mg via INTRAMUSCULAR

## 2014-12-07 NOTE — Patient Instructions (Signed)
Stop metformin. Start xigduo daily. Follow up in 3 months.  Given pneumonia shot.  Reminded of eye exam.

## 2014-12-10 NOTE — Progress Notes (Signed)
   Subjective:    Patient ID: Brandon Mckee, male    DOB: 09-27-1966, 48 y.o.   MRN: [id number]  HPI   Pt presents to the clinic for 3 month recheck.   DM- not checking sugars, no watching diet. Taking metformin. Denies any neuropathy or vision changes. No hypoglyemic events. Not had yearly eye exam.   Hypogonadism- needs shot today.     Review of Systems  All other systems reviewed and are negative.      Objective:   Physical Exam  Constitutional: He is oriented to person, place, and time. He appears well-developed and well-nourished.  Obese.   HENT:  Head: Normocephalic and atraumatic.  Cardiovascular: Normal rate, regular rhythm and normal heart sounds.   Pulmonary/Chest: Effort normal and breath sounds normal. He has no wheezes.  Neurological: He is alert and oriented to person, place, and time.  Psychiatric: He has a normal mood and affect. His behavior is normal.          Assessment & Plan:  DM, type II- .[name] Lab Results  Component Value Date   HGBA1C 7.4 12/07/2014   Stop metformin. Switched to [employer]. Given card once a day. Discussed side effects.  Foot exam done today.  Reminded of yearly exam for eyes.  Could not get UA today pt could not go to bathroom.  Pneumonia 23 shot given today.  Discussed diet and exercise.  Follow up in 3 months.   hypogonadism shot given today with 250mg  increase. Follow up in 2 weeks.   dysmetabolic syndrome- BP controlled. Continue on lipitor and niacin.

## 2014-12-20 ENCOUNTER — Ambulatory Visit (INDEPENDENT_AMBULATORY_CARE_PROVIDER_SITE_OTHER): Payer: BC Managed Care – PPO | Admitting: Family Medicine

## 2014-12-20 ENCOUNTER — Encounter: Payer: Self-pay | Admitting: Family Medicine

## 2014-12-20 DIAGNOSIS — E291 Testicular hypofunction: Secondary | ICD-10-CM

## 2014-12-20 MED ORDER — TESTOSTERONE CYPIONATE 200 MG/ML IM SOLN
250.0000 mg | Freq: Once | INTRAMUSCULAR | Status: AC
  Administered 2014-12-20: 250 mg via INTRAMUSCULAR

## 2014-12-20 NOTE — Progress Notes (Signed)
   Subjective:    Patient ID: Brandon Mckee, male    DOB: [birth year], 48 y.o.   MRN: [id number]  HPI  Ewen Varnell Marcoux is here for a testosterone injection. Denies chest pain, shortness of breath, headaches or mood changes.   Review of Systems     Objective:   Physical Exam        Assessment & Plan:  Patient tolerated injection well without complications. Patient advised to schedule next injection 14 days from today. Patient advised to return one week to check testosterone level.

## 2014-12-23 ENCOUNTER — Other Ambulatory Visit: Payer: Self-pay | Admitting: Physician Assistant

## 2015-01-30 ENCOUNTER — Other Ambulatory Visit: Payer: Self-pay | Admitting: Physician Assistant

## 2015-02-11 ENCOUNTER — Ambulatory Visit (INDEPENDENT_AMBULATORY_CARE_PROVIDER_SITE_OTHER): Payer: BLUE CROSS/BLUE SHIELD | Admitting: Physician Assistant

## 2015-02-11 ENCOUNTER — Encounter: Payer: Self-pay | Admitting: Physician Assistant

## 2015-02-11 VITALS — BP 126/67 | HR 74 | Wt 283.0 lb

## 2015-02-11 DIAGNOSIS — E291 Testicular hypofunction: Secondary | ICD-10-CM

## 2015-02-11 MED ORDER — TESTOSTERONE CYPIONATE 200 MG/ML IM SOLN
250.0000 mg | Freq: Once | INTRAMUSCULAR | Status: AC
  Administered 2015-02-11: 250 mg via INTRAMUSCULAR

## 2015-02-11 NOTE — Progress Notes (Signed)
   Subjective:    Patient ID: Brandon Mckee, male    DOB: May 21, 1966, 49 y.o.   MRN: [id number]  HPI  Brandon Mckee is here for a testosterone injection. Denies chest pain, shortness of breath, headaches or mood changes.   Review of Systems     Objective:   Physical Exam        Assessment & Plan:  Patient tolerated injection well without complications. Patient advised to schedule next injection 14 days from today.

## 2015-02-18 ENCOUNTER — Encounter: Payer: Self-pay | Admitting: Physician Assistant

## 2015-02-18 ENCOUNTER — Other Ambulatory Visit: Payer: Self-pay | Admitting: Physician Assistant

## 2015-02-18 MED ORDER — CYCLOBENZAPRINE HCL 5 MG PO TABS: ORAL_TABLET | ORAL | Status: DC

## 2015-02-22 ENCOUNTER — Ambulatory Visit (INDEPENDENT_AMBULATORY_CARE_PROVIDER_SITE_OTHER): Payer: BLUE CROSS/BLUE SHIELD | Admitting: Physician Assistant

## 2015-02-22 VITALS — BP 136/88 | HR 80 | Wt 292.0 lb

## 2015-02-22 DIAGNOSIS — E291 Testicular hypofunction: Secondary | ICD-10-CM | POA: Diagnosis not present

## 2015-02-22 MED ORDER — TESTOSTERONE CYPIONATE 200 MG/ML IM SOLN
250.0000 mg | Freq: Once | INTRAMUSCULAR | Status: AC
  Administered 2015-02-22: 250 mg via INTRAMUSCULAR

## 2015-02-22 NOTE — Progress Notes (Signed)
   Subjective:    Patient ID: Brandon Mckee, male    DOB: 31-May-1966, 49 y.o.   MRN: [id number]  HPI  Brandon Mckee is here for a testosterone injection. Denies chest pain, shortness of breath, headaches or mood changes.   Review of Systems     Objective:   Physical Exam        Assessment & Plan:  Patient tolerated injection well without complications. Patient advised to schedule next injection 14 days from today.

## 2015-03-01 ENCOUNTER — Other Ambulatory Visit: Payer: Self-pay | Admitting: Physician Assistant

## 2015-03-08 ENCOUNTER — Ambulatory Visit (INDEPENDENT_AMBULATORY_CARE_PROVIDER_SITE_OTHER): Payer: BLUE CROSS/BLUE SHIELD | Admitting: Sports Medicine

## 2015-03-08 VITALS — BP 156/91 | HR 74 | Ht 71.0 in | Wt 290.0 lb

## 2015-03-08 DIAGNOSIS — E291 Testicular hypofunction: Secondary | ICD-10-CM | POA: Diagnosis not present

## 2015-03-08 MED ORDER — TESTOSTERONE CYPIONATE 200 MG/ML IM SOLN
250.0000 mg | Freq: Once | INTRAMUSCULAR | Status: AC
  Administered 2015-03-08: 250 mg via INTRAMUSCULAR

## 2015-03-08 NOTE — Progress Notes (Signed)
   Subjective:    Patient ID: Brandon Mckee, male    DOB: July 21, 1966, 49 y.o.   MRN: [id number]  HPI Patient came into office today for testosterone injection. Denies chest pain, shortness of breath, headaches and problems associated with taking this medication. Patient states he has had no abnornal mood swings.    Review of Systems     Objective:   Physical Exam        Assessment & Plan:  Patient tolerated injection in [place] well without complications. Patient advised to schedule his next injection for 2 weeks from today.

## 2015-03-08 NOTE — Assessment & Plan Note (Signed)
Testosterone injection as above. 

## 2015-03-13 ENCOUNTER — Encounter: Payer: Self-pay | Admitting: Physician Assistant

## 2015-03-13 ENCOUNTER — Telehealth: Payer: Self-pay | Admitting: Physician Assistant

## 2015-03-13 ENCOUNTER — Ambulatory Visit (INDEPENDENT_AMBULATORY_CARE_PROVIDER_SITE_OTHER): Payer: BLUE CROSS/BLUE SHIELD | Admitting: Physician Assistant

## 2015-03-13 VITALS — BP 104/62 | HR 83 | Ht 71.0 in | Wt 288.0 lb

## 2015-03-13 DIAGNOSIS — E118 Type 2 diabetes mellitus with unspecified complications: Secondary | ICD-10-CM

## 2015-03-13 DIAGNOSIS — E291 Testicular hypofunction: Secondary | ICD-10-CM

## 2015-03-13 DIAGNOSIS — E8881 Metabolic syndrome: Secondary | ICD-10-CM | POA: Diagnosis not present

## 2015-03-13 LAB — POCT UA - MICROALBUMIN
Creatinine, POC: 100 mg/dL
Microalbumin Ur, POC: 80 mg/L

## 2015-03-13 LAB — POCT GLYCOSYLATED HEMOGLOBIN (HGB A1C): Hemoglobin A1C: 7.1

## 2015-03-13 MED ORDER — OMEPRAZOLE 40 MG PO CPDR: 40.0000 mg | DELAYED_RELEASE_CAPSULE | Freq: Every day | ORAL | Status: DC

## 2015-03-13 MED ORDER — DAPAGLIFLOZIN PRO-METFORMIN ER 10-1000 MG PO TB24: 1.0000 | ORAL_TABLET | Freq: Every day | ORAL | Status: DC

## 2015-03-13 MED ORDER — NIACIN ER (ANTIHYPERLIPIDEMIC) 500 MG PO TBCR: 500.0000 mg | EXTENDED_RELEASE_TABLET | Freq: Every day | ORAL | Status: DC

## 2015-03-13 MED ORDER — LISINOPRIL-HYDROCHLOROTHIAZIDE 20-25 MG PO TABS: 1.0000 | ORAL_TABLET | Freq: Every day | ORAL | Status: DC

## 2015-03-13 MED ORDER — ATORVASTATIN CALCIUM 40 MG PO TABS: 40.0000 mg | ORAL_TABLET | Freq: Every day | ORAL | Status: DC

## 2015-03-13 MED ORDER — CARVEDILOL 25 MG PO TABS: 25.0000 mg | ORAL_TABLET | Freq: Two times a day (BID) | ORAL | Status: DC

## 2015-03-13 MED ORDER — AMLODIPINE BESYLATE 10 MG PO TABS: 10.0000 mg | ORAL_TABLET | Freq: Every day | ORAL | Status: DC

## 2015-03-13 MED ORDER — FUROSEMIDE 20 MG PO TABS: ORAL_TABLET | ORAL | Status: DC

## 2015-03-13 MED ORDER — GABAPENTIN 100 MG PO CAPS: 100.0000 mg | ORAL_CAPSULE | Freq: Every day | ORAL | Status: DC

## 2015-03-13 MED ORDER — AMBULATORY NON FORMULARY MEDICATION: Status: DC

## 2015-03-13 NOTE — Progress Notes (Signed)
   Subjective:    Patient ID: Brandon Mckee, male    DOB: Oct 18, 1966, 49 y.o.   MRN: [id number]  HPI Pt presents to the clinic for 3 month recheck.   DM- on xigduo. Not checking sugars. Denies any hypoglycemic events. Not problems or concerns. No recent eye exam.   HTn- taking medications. Just took medication before walking into office. Doing well.   Testosterone- feeling better with more energy on shots. Increased in December. Needs recheck testosterone to make sure at good level.    Review of Systems  All other systems reviewed and are negative.      Objective:   Physical Exam  Constitutional: He is oriented to person, place, and time. He appears well-developed and well-nourished.  HENT:  Head: Normocephalic and atraumatic.  Cardiovascular: Normal rate, regular rhythm and normal heart sounds.   Pulmonary/Chest: Effort normal and breath sounds normal. He has no wheezes.  Neurological: He is alert and oriented to person, place, and time.  Skin: Skin is dry.  Psychiatric: He has a normal mood and affect. His behavior is normal.          Assessment & Plan:  DM- .[name] Lab Results  Component Value Date   HGBA1C 7.1 03/13/2015   Elevated micro albumin levels. On ACE. BP controlled. Working on DM control. Will recheck CMP.  Vaccines up to date.  Borderline glucose control. Pt does not want to start another medication. Continue xigduo,  Given glucometer encouraged to check fasting a couple of times a week at least.  Discussed need for eye exam.  Follow up in 3 months.   HTN- rechecked today and looked good. Refilled for 6 months.   Testosterone- getting shots every 2 weeks will recheck level to see if dose is appropriate.

## 2015-03-13 NOTE — Telephone Encounter (Signed)
Received PA for Xigduo sent through cover my meds waiting on auth. - CF

## 2015-03-15 ENCOUNTER — Ambulatory Visit: Payer: BC Managed Care – PPO | Admitting: Physician Assistant

## 2015-03-15 LAB — PSA, TOTAL AND FREE
PSA, Free Pct: 21 % — ABNORMAL LOW (ref >25)
PSA, Free: 0.25 ng/mL
PSA: 1.18 ng/mL (ref <=4.00)

## 2015-03-15 LAB — COMPLETE METABOLIC PANEL WITH GFR
ALT: 53 U/L (ref 0–53)
AST: 31 U/L (ref 0–37)
Albumin: 4.4 g/dL (ref 3.5–5.2)
Alkaline Phosphatase: 106 U/L (ref 39–117)
BUN: 11 mg/dL (ref 6–23)
CALCIUM: 9.2 mg/dL (ref 8.4–10.5)
CO2: 29 mEq/L (ref 19–32)
Chloride: 101 mEq/L (ref 96–112)
Creat: 0.93 mg/dL (ref 0.50–1.35)
GFR, Est Non African American: >89 mL/min
Glucose, Bld: 143 mg/dL — ABNORMAL HIGH (ref 70–99)
POTASSIUM: 3.4 meq/L — AB (ref 3.5–5.3)
Sodium: 143 mEq/L (ref 135–145)
Total Bilirubin: 0.6 mg/dL (ref 0.2–1.2)
Total Protein: 6.9 g/dL (ref 6.0–8.3)

## 2015-03-15 LAB — CBC
HCT: 46 % (ref 39.0–52.0)
Hemoglobin: 15.6 g/dL (ref 13.0–17.0)
MCH: 30.2 pg (ref 26.0–34.0)
MCHC: 33.9 g/dL (ref 30.0–36.0)
MCV: 89.1 fL (ref 78.0–100.0)
MPV: 10.7 fL (ref 8.6–12.4)
Platelets: 255 10*3/uL (ref 150–400)
RBC: 5.16 MIL/uL (ref 4.22–5.81)
RDW: 14.1 % (ref 11.5–15.5)
WBC: 8 10*3/uL (ref 4.0–10.5)

## 2015-03-15 LAB — TESTOSTERONE, FREE, TOTAL, SHBG
Sex Hormone Binding: 17 nmol/L (ref 10–50)
Testosterone, Free: 201.7 pg/mL (ref 47.0–244.0)
Testosterone-% Free: 3 % — ABNORMAL HIGH (ref 1.6–2.9)
Testosterone: 675 ng/dL (ref 300–890)

## 2015-03-18 ENCOUNTER — Encounter: Payer: Self-pay | Admitting: Physician Assistant

## 2015-03-18 NOTE — Telephone Encounter (Signed)
Received fax from [facility] they denied coverage on Xigduo XR but patient has a discount card that should be run as secondary insurance and patient should get med. I spoke with Mertha Finders at [employer] and he said to give the patient another card and have them call the 800 number to see what they suggest because the med should be covered. - CF

## 2015-03-22 ENCOUNTER — Ambulatory Visit (INDEPENDENT_AMBULATORY_CARE_PROVIDER_SITE_OTHER): Payer: BLUE CROSS/BLUE SHIELD | Admitting: Physician Assistant

## 2015-03-22 VITALS — BP 150/103 | HR 73 | Ht 71.0 in | Wt 289.0 lb

## 2015-03-22 DIAGNOSIS — E291 Testicular hypofunction: Secondary | ICD-10-CM | POA: Diagnosis not present

## 2015-03-22 MED ORDER — TESTOSTERONE CYPIONATE 200 MG/ML IM SOLN
250.0000 mg | Freq: Once | INTRAMUSCULAR | Status: AC
  Administered 2015-03-22: 250 mg via INTRAMUSCULAR

## 2015-03-22 NOTE — Progress Notes (Signed)
   Subjective:    Patient ID: Brandon Mckee, male    DOB: 04-Jun-1966, 48 y.o.   MRN: [id number]  HPI Patient came into office today for testosterone injection. Denies chest pain, shortness of breath, headaches and problems associated with taking this medication. Patient states he has had no abnornal mood swings. Patient reports he has not taken his blood pressure medications this morning, he was rushing to get his kids to school for their field trip. Verified with Iran Planas that administration of injection was still acceptable.   Review of Systems     Objective:   Physical Exam        Assessment & Plan:  Patient tolerated injection in [place] well without complications. Patient advised to schedule his next injection for 2 weeks from today. Reminded Patient to go home and take his medications for the day. Verbalized understanding, no further questions.   Pt was seen recently and after taking BP medication BP normalizes. Iran Planas PA-C

## 2015-03-26 ENCOUNTER — Encounter: Payer: Self-pay | Admitting: Physician Assistant

## 2015-04-01 ENCOUNTER — Encounter: Payer: Self-pay | Admitting: Physician Assistant

## 2015-04-02 ENCOUNTER — Other Ambulatory Visit: Payer: Self-pay | Admitting: Physician Assistant

## 2015-04-02 DIAGNOSIS — E876 Hypokalemia: Secondary | ICD-10-CM

## 2015-04-02 LAB — COMPLETE METABOLIC PANEL WITH GFR
ALK PHOS: 102 U/L (ref 39–117)
ALT: 41 U/L (ref 0–53)
AST: 26 U/L (ref 0–37)
Albumin: 4.1 g/dL (ref 3.5–5.2)
BILIRUBIN TOTAL: 0.6 mg/dL (ref 0.2–1.2)
BUN: 8 mg/dL (ref 6–23)
CO2: 26 meq/L (ref 19–32)
CREATININE: 0.9 mg/dL (ref 0.50–1.35)
Calcium: 8.8 mg/dL (ref 8.4–10.5)
Chloride: 103 mEq/L (ref 96–112)
GFR, Est African American: >89 mL/min
Glucose, Bld: 197 mg/dL — ABNORMAL HIGH (ref 70–99)
Potassium: 3.9 mEq/L (ref 3.5–5.3)
SODIUM: 141 meq/L (ref 135–145)
TOTAL PROTEIN: 6.8 g/dL (ref 6.0–8.3)

## 2015-04-05 ENCOUNTER — Ambulatory Visit (INDEPENDENT_AMBULATORY_CARE_PROVIDER_SITE_OTHER): Payer: BLUE CROSS/BLUE SHIELD | Admitting: Physician Assistant

## 2015-04-05 VITALS — BP 154/96 | HR 74 | Wt 287.0 lb

## 2015-04-05 DIAGNOSIS — E291 Testicular hypofunction: Secondary | ICD-10-CM | POA: Diagnosis not present

## 2015-04-05 DIAGNOSIS — I10 Essential (primary) hypertension: Secondary | ICD-10-CM | POA: Diagnosis not present

## 2015-04-05 MED ORDER — TESTOSTERONE CYPIONATE 200 MG/ML IM SOLN
250.0000 mg | Freq: Once | INTRAMUSCULAR | Status: AC
  Administered 2015-04-05: 250 mg via INTRAMUSCULAR

## 2015-04-05 NOTE — Progress Notes (Signed)
   Subjective:    Patient ID: Brandon Mckee, male    DOB: February 03, 1966, 49 y.o.   MRN: [id number]  HPI Patient came into office today for testosterone injection. Denies chest pain, shortness of breath, headaches and problems associated with taking this medication. Patient states he has had no abnornal mood swings, although his wife reports he "gets irritated easier." Patient states (while laughing) "they shouldn't make him mad as often."    Review of Systems     Objective:   Physical Exam        Assessment & Plan:  Patient tolerated injection well in [place] without complications. Patient advised to schedule his next injection for 2 weeks from today.  HTN- per pt just took BP medication. Pt has hx of just taking BP medication when arrives and then coming down by end of visit. Discuss importance of good BP readings in office. At next visit may have to wait until BP comes down to give testosterone shot. Iran Planas PA-C

## 2015-04-09 ENCOUNTER — Telehealth: Payer: Self-pay | Admitting: Physician Assistant

## 2015-04-09 NOTE — Telephone Encounter (Signed)
Received fax for pa on Xigduo sent through cover my meds waiting on auth. - CF

## 2015-04-09 NOTE — Telephone Encounter (Signed)
I spoke with [facility] and they got the medication to go through on his discount card I will still wait on auth but med is approved - CF

## 2015-04-19 ENCOUNTER — Ambulatory Visit (INDEPENDENT_AMBULATORY_CARE_PROVIDER_SITE_OTHER): Payer: BLUE CROSS/BLUE SHIELD | Admitting: Physician Assistant

## 2015-04-19 VITALS — BP 134/76 | HR 71 | Wt 279.0 lb

## 2015-04-19 DIAGNOSIS — E291 Testicular hypofunction: Secondary | ICD-10-CM

## 2015-04-19 MED ORDER — TESTOSTERONE CYPIONATE 200 MG/ML IM SOLN
250.0000 mg | Freq: Once | INTRAMUSCULAR | Status: AC
  Administered 2015-04-19: 250 mg via INTRAMUSCULAR

## 2015-04-19 NOTE — Progress Notes (Signed)
   Subjective:    Patient ID: Brandon Mckee, male    DOB: 1966/03/08, 49 y.o.   MRN: [id number]  HPI  [place] Pak Loveday is here for a testosterone injection. Denies chest pain, shortness of breath, headaches or mood changes.   Review of Systems     Objective:   Physical Exam        Assessment & Plan:  Patient tolerated injection well without complications. Patient advised to schedule next injection 14 days from today.

## 2015-05-03 ENCOUNTER — Ambulatory Visit (INDEPENDENT_AMBULATORY_CARE_PROVIDER_SITE_OTHER): Payer: BLUE CROSS/BLUE SHIELD | Admitting: Family Medicine

## 2015-05-03 VITALS — BP 143/89 | HR 91 | Ht 71.0 in | Wt 279.0 lb

## 2015-05-03 DIAGNOSIS — E291 Testicular hypofunction: Secondary | ICD-10-CM | POA: Diagnosis not present

## 2015-05-03 MED ORDER — TESTOSTERONE CYPIONATE 200 MG/ML IM SOLN
250.0000 mg | Freq: Once | INTRAMUSCULAR | Status: AC
  Administered 2015-05-03: 250 mg via INTRAMUSCULAR

## 2015-05-03 NOTE — Progress Notes (Signed)
   Subjective:    Patient ID: Brandon Mckee, male    DOB: 1966-11-07, 49 y.o.   MRN: [id number] Pt in for testosterone injection.  Given LUOQ today with no complications. Beatris Ship, CMA HPI    Review of Systems     Objective:   Physical Exam        Assessment & Plan:  Agree with above for hypogonadism.   Beatrice Lecher, MD

## 2015-05-11 ENCOUNTER — Other Ambulatory Visit: Payer: Self-pay | Admitting: Physician Assistant

## 2015-05-17 ENCOUNTER — Ambulatory Visit (INDEPENDENT_AMBULATORY_CARE_PROVIDER_SITE_OTHER): Payer: BLUE CROSS/BLUE SHIELD | Admitting: Sports Medicine

## 2015-05-17 VITALS — BP 133/75 | HR 76 | Wt 280.0 lb

## 2015-05-17 DIAGNOSIS — E291 Testicular hypofunction: Secondary | ICD-10-CM

## 2015-05-17 MED ORDER — TESTOSTERONE CYPIONATE 200 MG/ML IM SOLN
250.0000 mg | Freq: Once | INTRAMUSCULAR | Status: AC
  Administered 2015-05-17: 250 mg via INTRAMUSCULAR

## 2015-05-17 NOTE — Assessment & Plan Note (Signed)
Testosterone injection as above. 

## 2015-05-17 NOTE — Progress Notes (Signed)
   Subjective:    Patient ID: Brandon Mckee, male    DOB: [birth year], 49 y.o.   MRN: [id number]  HPI  Patient came into office today for testosterone injection. Denies chest pain, shortness of breath, headaches and problems associated with taking this medication. Patient states he has had no abnornal mood swings.   Review of Systems     Objective:   Physical Exam        Assessment & Plan:  Patient tolerated injection well without complications. Patient advised to schedule his next injection for 2 weeks from today.

## 2015-05-31 ENCOUNTER — Ambulatory Visit: Payer: BLUE CROSS/BLUE SHIELD

## 2015-06-03 ENCOUNTER — Ambulatory Visit (INDEPENDENT_AMBULATORY_CARE_PROVIDER_SITE_OTHER): Payer: BLUE CROSS/BLUE SHIELD | Admitting: Sports Medicine

## 2015-06-03 VITALS — BP 144/87 | HR 82 | Wt 281.0 lb

## 2015-06-03 DIAGNOSIS — E291 Testicular hypofunction: Secondary | ICD-10-CM | POA: Diagnosis not present

## 2015-06-03 MED ORDER — TESTOSTERONE CYPIONATE 200 MG/ML IM SOLN
250.0000 mg | Freq: Once | INTRAMUSCULAR | Status: AC
  Administered 2015-06-03: 250 mg via INTRAMUSCULAR

## 2015-06-03 NOTE — Progress Notes (Signed)
   Subjective:    Patient ID: Brandon Mckee, male    DOB: 1966/04/03, 49 y.o.   MRN: [id number]  HPI  Patient came into office today for testosterone injection. Denies chest pain, shortness of breath, headaches and problems associated with taking this medication. Patient states he has had no abnornal mood swings.   Review of Systems     Objective:   Physical Exam        Assessment & Plan:  Patient tolerated injection in [place] well without complications. Patient advised to schedule his next injection for 2 weeks from today.

## 2015-06-03 NOTE — Assessment & Plan Note (Signed)
Testosterone injection as above. 

## 2015-06-06 ENCOUNTER — Telehealth: Payer: Self-pay | Admitting: Physician Assistant

## 2015-06-06 ENCOUNTER — Ambulatory Visit (INDEPENDENT_AMBULATORY_CARE_PROVIDER_SITE_OTHER): Payer: BLUE CROSS/BLUE SHIELD | Admitting: Family Medicine

## 2015-06-06 ENCOUNTER — Encounter: Payer: Self-pay | Admitting: Family Medicine

## 2015-06-06 VITALS — BP 128/75 | HR 64 | Wt 281.0 lb

## 2015-06-06 DIAGNOSIS — E8881 Metabolic syndrome: Secondary | ICD-10-CM

## 2015-06-06 DIAGNOSIS — E119 Type 2 diabetes mellitus without complications: Secondary | ICD-10-CM

## 2015-06-06 DIAGNOSIS — R609 Edema, unspecified: Secondary | ICD-10-CM

## 2015-06-06 DIAGNOSIS — I10 Essential (primary) hypertension: Secondary | ICD-10-CM

## 2015-06-06 MED ORDER — FUROSEMIDE 20 MG PO TABS: ORAL_TABLET | ORAL | Status: DC

## 2015-06-06 MED ORDER — DAPAGLIFLOZIN PRO-METFORMIN ER 10-1000 MG PO TB24: 1.0000 | ORAL_TABLET | Freq: Every day | ORAL | Status: DC

## 2015-06-06 MED ORDER — CYCLOBENZAPRINE HCL 5 MG PO TABS: ORAL_TABLET | ORAL | Status: DC

## 2015-06-06 NOTE — Telephone Encounter (Signed)
Received fax for prior authorization on Xigduo XR 10 mg - 1,000 mg Tab sent through cover my meds waiting on authorization. - CF

## 2015-06-06 NOTE — Progress Notes (Signed)
CC: Brandon Mckee is a 49 y.o. male is here for Diabetes   Subjective: HPI:  Follow up type 2 diabetes: Has cut out donuts and sodas in his diet. No new exercise routine. Denies polyuria or polyphagia or polydipsia. No blood sugars to report.  Follow-up hypertension: Continues to take amlodipine, carvedilol, lisinopril-hydrochlorothiazide. No outside blood pressures to report. No chest pain shortness of breath orthopnea nor peripheral edema.  Follow-up edema: Requesting refills on Lasix. He takes this almost daily now to help with edema in the legs. He denies any lesions of the legs.   Review Of Systems Outlined In HPI  Past Medical History  Diagnosis Date  . Hyperlipidemia   . Metabolic syndrome     IFG  . Hypertension   . Pes anserine bursitis     bilateral    Past Surgical History  Procedure Laterality Date  . Appendectomy     Family History  Problem Relation Age of Onset  . Hyperlipidemia Mother   . Hypertension Mother   . Hypertension Father   . Hyperlipidemia Father   . Obesity Sister   . Hypertension Sister     History   Social History  . Marital Status: Married    Spouse Name: N/A  . Number of Children: N/A  . Years of Education: N/A   Occupational History  . Not on file.   Social History Main Topics  . Smoking status: Never Smoker   . Smokeless tobacco: Not on file  . Alcohol Use: Not on file  . Drug Use: Not on file  . Sexual Activity: Not on file   Other Topics Concern  . Not on file   Social History Narrative     Objective: BP 128/75 mmHg  Pulse 64  Wt 281 lb (127.461 kg)  General: Alert and Oriented, No Acute Distress HEENT: Pupils equal, round, reactive to light. Conjunctivae clear.  Moist mucous membranes pharynx unremarkable Lungs: Clear to auscultation bilaterally, no wheezing/ronchi/rales.  Comfortable work of breathing. Good air movement. Cardiac: Regular rate and rhythm. Normal S1/S2.  No murmurs, rubs, nor gallops.    Extremities: Trace pitting edema in both lower extremities from the shins distally.  Strong peripheral pulses.  Mental Status: No depression, anxiety, nor agitation. Skin: Warm and dry.  Assessment & Plan: Brandon Mckee was seen today for diabetes.  Diagnoses and all orders for this visit:  DYSMETABOLIC SYNDROME Orders: -     Dapagliflozin-Metformin HCl ER (XIGDUO XR) 09-999 MG TB24; Take 1 tablet by mouth daily.  Controlled diabetes mellitus type II without complication Orders: -     Dapagliflozin-Metformin HCl ER (XIGDUO XR) 09-999 MG TB24; Take 1 tablet by mouth daily.  HYPERTENSION, BENIGN  Edema Orders: -     furosemide (LASIX) 20 MG tablet; One by mouth daily as needed for leg swelling.  Other orders -     cyclobenzaprine (FLEXERIL) 5 MG tablet; TAKE ONE TABLET BY MOUTH NIGHTLY AT BEDTIME AS NEEDED FOR MUSCLE SPASMS   Type 2 diabetes: A1c of 6.1, controlled continue current dose of xigduo XR, congratulated his dietary changes, urged to quit drinking Sprite which he admits to still drinking daily. Eye exam up-to-date as of April this year. Hypertension: Controlled continue all current antihypertensives. Edema: Controlled continue as needed Lasix.  Return in about 3 months (around 09/06/2015) for Sugar.

## 2015-06-07 ENCOUNTER — Ambulatory Visit: Payer: BLUE CROSS/BLUE SHIELD | Admitting: Family Medicine

## 2015-06-11 NOTE — Telephone Encounter (Signed)
Request for Xigduo through cover my meds was cancelled due to being a duplicate claim from 4/48/18 that was denied - CF

## 2015-06-14 ENCOUNTER — Ambulatory Visit: Payer: BLUE CROSS/BLUE SHIELD

## 2015-06-20 ENCOUNTER — Ambulatory Visit (INDEPENDENT_AMBULATORY_CARE_PROVIDER_SITE_OTHER): Payer: BLUE CROSS/BLUE SHIELD | Admitting: Family Medicine

## 2015-06-20 VITALS — BP 133/80 | HR 81 | Ht 71.0 in | Wt 279.0 lb

## 2015-06-20 DIAGNOSIS — E291 Testicular hypofunction: Secondary | ICD-10-CM

## 2015-06-20 MED ORDER — TESTOSTERONE CYPIONATE 200 MG/ML IM SOLN
250.0000 mg | Freq: Once | INTRAMUSCULAR | Status: AC
  Administered 2015-06-20: 250 mg via INTRAMUSCULAR

## 2015-06-20 NOTE — Progress Notes (Signed)
   Subjective:    Patient ID: Brandon Mckee, male    DOB: 05/25/1966, 49 y.o.   MRN: [id number]  HPI  Patient was here today for a testosterone injection. Denies chest pain, SOB, headaches and problems with medications and mood changes.   Review of Systems     Objective:   Physical Exam        Assessment & Plan:  Patient tolerated injection well in the RUOQ without complications. Patient advised to schedule next injection in 14 days.

## 2015-07-05 ENCOUNTER — Ambulatory Visit (INDEPENDENT_AMBULATORY_CARE_PROVIDER_SITE_OTHER): Payer: BLUE CROSS/BLUE SHIELD | Admitting: Family Medicine

## 2015-07-05 VITALS — BP 129/73 | HR 92 | Wt 280.0 lb

## 2015-07-05 DIAGNOSIS — E291 Testicular hypofunction: Secondary | ICD-10-CM | POA: Diagnosis not present

## 2015-07-05 MED ORDER — TESTOSTERONE CYPIONATE 200 MG/ML IM SOLN
250.0000 mg | Freq: Once | INTRAMUSCULAR | Status: AC
  Administered 2015-07-05: 250 mg via INTRAMUSCULAR

## 2015-07-05 NOTE — Progress Notes (Signed)
   Subjective:    Patient ID: Brandon Mckee, male    DOB: [birth year], 49 y.o.   MRN: [id number]  HPI  Brandon Mckee is here for a testosterone injection. Denies chest pain, shortness of breath, headaches or mood changes.    Review of Systems     Objective:   Physical Exam        Assessment & Plan:  Patient tolerated injection well without complications. Patient advised to schedule next injection 14 days from today.

## 2015-07-05 NOTE — Progress Notes (Signed)
   Subjective:    Patient ID: Brandon Mckee, male    DOB: Oct 11, 1966, 49 y.o.   MRN: [id number]  HPI    Review of Systems     Objective:   Physical Exam        Assessment & Plan:  Agree with below.  Beatrice Lecher, MD

## 2015-07-16 ENCOUNTER — Encounter: Payer: BLUE CROSS/BLUE SHIELD | Admitting: Family Medicine

## 2015-07-17 NOTE — Progress Notes (Signed)
   Subjective:    Patient ID: Brandon Mckee, male    DOB: 1966-05-18, 49 y.o.   MRN: [id number]  HPI    Review of Systems     Objective:   Physical Exam        Assessment & Plan:  error

## 2015-07-19 ENCOUNTER — Ambulatory Visit (INDEPENDENT_AMBULATORY_CARE_PROVIDER_SITE_OTHER): Payer: BLUE CROSS/BLUE SHIELD | Admitting: Sports Medicine

## 2015-07-19 ENCOUNTER — Ambulatory Visit: Payer: BLUE CROSS/BLUE SHIELD

## 2015-07-19 VITALS — BP 124/64 | HR 75 | Wt 282.0 lb

## 2015-07-19 DIAGNOSIS — E291 Testicular hypofunction: Secondary | ICD-10-CM | POA: Diagnosis not present

## 2015-07-19 MED ORDER — TESTOSTERONE CYPIONATE 200 MG/ML IM SOLN
250.0000 mg | Freq: Once | INTRAMUSCULAR | Status: AC
  Administered 2015-07-19: 250 mg via INTRAMUSCULAR

## 2015-07-19 NOTE — Progress Notes (Signed)
   Subjective:    Patient ID: Brandon Mckee, male    DOB: 24-May-1966, 49 y.o.   MRN: [id number]  HPI Patient came into office today for testosterone injection. Denies chest pain, shortness of breath, headaches and problems associated with taking this medication. Patient states he has had no abnornal mood swings.    Review of Systems     Objective:   Physical Exam        Assessment & Plan:  Patient tolerated injection in [place]  well without complications. Patient advised to schedule his next injection for 2 weeks from today.

## 2015-07-19 NOTE — Assessment & Plan Note (Signed)
Testosterone injection as above. 

## 2015-08-02 ENCOUNTER — Ambulatory Visit (INDEPENDENT_AMBULATORY_CARE_PROVIDER_SITE_OTHER): Payer: BLUE CROSS/BLUE SHIELD | Admitting: Sports Medicine

## 2015-08-02 VITALS — BP 135/84 | HR 93 | Wt 284.0 lb

## 2015-08-02 DIAGNOSIS — E291 Testicular hypofunction: Secondary | ICD-10-CM | POA: Diagnosis not present

## 2015-08-02 MED ORDER — TESTOSTERONE CYPIONATE 200 MG/ML IM SOLN
250.0000 mg | Freq: Once | INTRAMUSCULAR | Status: AC
  Administered 2015-08-02: 250 mg via INTRAMUSCULAR

## 2015-08-02 NOTE — Progress Notes (Signed)
   Subjective:    Patient ID: Brandon Mckee, male    DOB: 11-27-1966, 49 y.o.   MRN: [id number]  HPI  Moe Graca Kinnamon is here for a testosterone injection. Denies chest pain, shortness of breath, headaches or mood changes.   Review of Systems     Objective:   Physical Exam        Assessment & Plan:  Patient tolerated injection well without complications. Patient advised to schedule next injection 14 days from today.

## 2015-08-02 NOTE — Assessment & Plan Note (Signed)
Testosterone injection as above. 

## 2015-08-16 ENCOUNTER — Ambulatory Visit (INDEPENDENT_AMBULATORY_CARE_PROVIDER_SITE_OTHER): Payer: BLUE CROSS/BLUE SHIELD | Admitting: Physician Assistant

## 2015-08-16 VITALS — BP 153/98 | HR 75 | Wt 280.0 lb

## 2015-08-16 DIAGNOSIS — E291 Testicular hypofunction: Secondary | ICD-10-CM

## 2015-08-16 DIAGNOSIS — I10 Essential (primary) hypertension: Secondary | ICD-10-CM

## 2015-08-16 MED ORDER — TESTOSTERONE CYPIONATE 200 MG/ML IM SOLN
250.0000 mg | Freq: Once | INTRAMUSCULAR | Status: AC
  Administered 2015-08-16: 250 mg via INTRAMUSCULAR

## 2015-08-16 NOTE — Progress Notes (Signed)
Pt has an appt with 09/06/15. Spoke with Pt's wife and advised it is very important Pt keep this appt regarding his BP. States she will make sure he keeps the appt and takes his BP Rx prior to appt.

## 2015-08-16 NOTE — Progress Notes (Signed)
   Subjective:    Patient ID: Brandon Mckee, male    DOB: 1966-07-13, 49 y.o.   MRN: [id number]  HPI  Brandon Mckee is here for a testosterone injection. Denies chest pain, shortness of breath, headaches or mood changes. He does have elevated blood pressure today. He states he forgot to take his blood pressure medication this morning.     Review of Systems     Objective:   Physical Exam        Assessment & Plan:  Patient tolerated injection well without complications. Patient advised to schedule next injection 14 days from today. Advised to take blood pressure medication.

## 2015-08-28 ENCOUNTER — Encounter: Payer: Self-pay | Admitting: Physician Assistant

## 2015-08-30 ENCOUNTER — Ambulatory Visit (INDEPENDENT_AMBULATORY_CARE_PROVIDER_SITE_OTHER): Payer: BLUE CROSS/BLUE SHIELD | Admitting: Physician Assistant

## 2015-08-30 VITALS — BP 142/90 | HR 87 | Wt 285.0 lb

## 2015-08-30 DIAGNOSIS — E8881 Metabolic syndrome: Secondary | ICD-10-CM

## 2015-08-30 DIAGNOSIS — E538 Deficiency of other specified B group vitamins: Secondary | ICD-10-CM

## 2015-08-30 DIAGNOSIS — E291 Testicular hypofunction: Secondary | ICD-10-CM

## 2015-08-30 DIAGNOSIS — E119 Type 2 diabetes mellitus without complications: Secondary | ICD-10-CM

## 2015-08-30 MED ORDER — TESTOSTERONE CYPIONATE 200 MG/ML IM SOLN
250.0000 mg | Freq: Once | INTRAMUSCULAR | Status: AC
  Administered 2015-08-30: 250 mg via INTRAMUSCULAR

## 2015-08-30 NOTE — Progress Notes (Signed)
   Subjective:    Patient ID: Brandon Mckee, male    DOB: [birth year], 49 y.o.   MRN: [id number]  HPI  Patient came into office today for testosterone injection. Denies chest pain, shortness of breath, headaches and problems associated with taking this medication. Patient states he has had no abnornal mood swings. Pt BP was slightly elevated today, states he took his BP Rx's about 0800, and then just "sat on the couch" until his appt. Pt questions if he should get his lab work completed prior to his upcoming appt with PCP. Advised I would route this encounter to PCP for review if labs should be put in.  Review of Systems     Objective:   Physical Exam        Assessment & Plan:  Patient tolerated injection in [place] well without complications. Patient advised to schedule his next injection for 2 weeks from today. Please make office visit.

## 2015-09-06 ENCOUNTER — Encounter: Payer: Self-pay | Admitting: Physician Assistant

## 2015-09-06 ENCOUNTER — Ambulatory Visit (INDEPENDENT_AMBULATORY_CARE_PROVIDER_SITE_OTHER): Payer: BLUE CROSS/BLUE SHIELD | Admitting: Physician Assistant

## 2015-09-06 VITALS — BP 119/69 | HR 83 | Ht 71.0 in | Wt 288.0 lb

## 2015-09-06 DIAGNOSIS — R4586 Emotional lability: Secondary | ICD-10-CM

## 2015-09-06 DIAGNOSIS — E8881 Metabolic syndrome: Secondary | ICD-10-CM

## 2015-09-06 DIAGNOSIS — E119 Type 2 diabetes mellitus without complications: Secondary | ICD-10-CM | POA: Diagnosis not present

## 2015-09-06 DIAGNOSIS — Q828 Other specified congenital malformations of skin: Secondary | ICD-10-CM | POA: Diagnosis not present

## 2015-09-06 DIAGNOSIS — D239 Other benign neoplasm of skin, unspecified: Secondary | ICD-10-CM

## 2015-09-06 DIAGNOSIS — F39 Unspecified mood [affective] disorder: Secondary | ICD-10-CM

## 2015-09-06 DIAGNOSIS — D229 Melanocytic nevi, unspecified: Secondary | ICD-10-CM

## 2015-09-06 DIAGNOSIS — R0981 Nasal congestion: Secondary | ICD-10-CM

## 2015-09-06 LAB — POCT GLYCOSYLATED HEMOGLOBIN (HGB A1C): Hemoglobin A1C: 6.7

## 2015-09-06 MED ORDER — RANITIDINE HCL 150 MG PO TABS: 150.0000 mg | ORAL_TABLET | Freq: Every day | ORAL | Status: DC

## 2015-09-06 NOTE — Progress Notes (Addendum)
   Subjective:    Patient ID: Brandon Mckee, male    DOB: 14-Sep-1966, 49 y.o.   MRN: [id number]  HPI Patient presents to clinic for a follow up of DM. He states he is taking his dapagliflozin-metformin regularly. He reports he is trying improve his diet, cutting out baked goods, but has trouble maintaining a healthy diet. He does not currently participate in regular physical activity. He does not check his blood sugar regularly at home. He reports an increase in GERD symptoms, particularly at night. Symptoms result in coughing spells, and occasional hematemesis. 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 He reports feeling fatigued throughout the day, and sometimes "dozes off while driving, going to the bathroom, etc". He states he is not currently using his CPAP at night. He also voices concerns about a mole on his back. He has noticed it getting darker.   Review of Systems Positive for symptoms listed in HPI    Objective:   Physical Exam Blood pressure 119/69, pulse 83, height 5\' 11"  (1.803 m), weight 288 lb (130.636 kg).  HEENT: PERRLA, conjunctiva is erythematous. Nasal mucosa is erythematous with mucus obstruction present. Breathing is labored with sinus congestion apparent during conversation.  Cardiac: Normal S1, S2. No M/G/R Pulm: CTAB Mood: Patient appeared distressed  Skin: circular papule with hyperpigmentation and ranges of color approximately 72mm by 17mm on upper mid back.          Assessment & Plan:  OSA- Educate on importance of wearing CPAP, told patient regular use would improve symptoms of fatigue.   DM- .[name] Lab Results  Component Value Date   HGBA1C 6.7 09/06/2015   Well controlled.  Discussed importance of balanced diet, and implementing moderate physical activity. Continue daptaglifozin-metformin at current dose.  Allergies- Discussed symptoms of allergies and encouraged  oral antihistamine(zyrtec or claritin) and intranasal steroid such as flonase to improve congestion.  Anxiety- Discussed importance of self care, and possibility of respite care for his wife. Patient declined medication at this time.   GERD- Continue 40 mg omeprazole, rx 150 mg tab ranitidine for nighttime use. Discussed GERD diet and weight loss.   Atypical nevus- discussed with pt likely benign but since there has been some changes needs biopsy. Pt will schedule appt for this.   Pt aware needs fasting labs and should be getting insurance in the next 2 months.   Iran Planas PA-C

## 2015-09-13 ENCOUNTER — Other Ambulatory Visit: Payer: Self-pay | Admitting: Emergency Medicine

## 2015-09-13 ENCOUNTER — Ambulatory Visit (INDEPENDENT_AMBULATORY_CARE_PROVIDER_SITE_OTHER): Payer: BLUE CROSS/BLUE SHIELD | Admitting: Physician Assistant

## 2015-09-13 VITALS — BP 127/76 | HR 71 | Temp 97.8°F | Resp 16 | Wt 285.0 lb

## 2015-09-13 DIAGNOSIS — Z23 Encounter for immunization: Secondary | ICD-10-CM | POA: Diagnosis not present

## 2015-09-13 DIAGNOSIS — E291 Testicular hypofunction: Secondary | ICD-10-CM

## 2015-09-13 MED ORDER — TESTOSTERONE CYPIONATE 200 MG/ML IM SOLN
250.0000 mg | INTRAMUSCULAR | Status: DC
  Administered 2015-09-13: 250 mg via INTRAMUSCULAR

## 2015-09-13 NOTE — Progress Notes (Signed)
   Subjective:    Patient ID: Brandon Mckee, male    DOB: 06-21-1966, 49 y.o.   MRN: [id number]  HPIPatient here for testosterone injection. Would also like his annual Flu vaccination. Denies chest pain, shortness of breath, headache or other medication problems.    Review of Systems     Objective:   Physical Exam        Assessment & Plan:  Patient tolerated injections well without complications. Patient advised to schedule next injection in 14 days.

## 2015-09-27 ENCOUNTER — Ambulatory Visit (INDEPENDENT_AMBULATORY_CARE_PROVIDER_SITE_OTHER): Payer: BLUE CROSS/BLUE SHIELD | Admitting: Sports Medicine

## 2015-09-27 VITALS — BP 127/64 | HR 74 | Wt 289.0 lb

## 2015-09-27 DIAGNOSIS — E291 Testicular hypofunction: Secondary | ICD-10-CM

## 2015-09-27 MED ORDER — TESTOSTERONE CYPIONATE 200 MG/ML IM SOLN
250.0000 mg | Freq: Once | INTRAMUSCULAR | Status: AC
  Administered 2015-09-27: 250 mg via INTRAMUSCULAR

## 2015-09-27 NOTE — Progress Notes (Signed)
   Subjective:    Patient ID: Brandon Mckee, male    DOB: [birth year], 49 y.o.   MRN: [id number]  HPI  [name] is here for a testosterone injection.  Denies SOB, chest pain, headaches, and problems with medication.  Review of Systems     Objective:   Physical Exam        Assessment & Plan:   [name] tolerated injection well without any complications.  Pt advised to make 14 day appointment for next injection.

## 2015-10-11 ENCOUNTER — Ambulatory Visit (INDEPENDENT_AMBULATORY_CARE_PROVIDER_SITE_OTHER): Payer: Self-pay | Admitting: Physician Assistant

## 2015-10-11 VITALS — BP 155/94 | HR 81 | Wt 288.0 lb

## 2015-10-11 DIAGNOSIS — E291 Testicular hypofunction: Secondary | ICD-10-CM

## 2015-10-11 DIAGNOSIS — R7989 Other specified abnormal findings of blood chemistry: Secondary | ICD-10-CM

## 2015-10-11 MED ORDER — TESTOSTERONE CYPIONATE 200 MG/ML IM SOLN: 250.0000 mg | Freq: Once | INTRAMUSCULAR | Status: DC

## 2015-10-11 NOTE — Progress Notes (Signed)
Patient ID: Brandon Mckee, male   DOB: [birth year], 49 y.o.   MRN: [id number]   Patient denies any Shortness of Breath or chest Pain  Patient tolerated injection well in the [place].  Reminded patient to take his BP medication due to BP was elevated this AM & to return in 14 days for next injection

## 2015-11-01 ENCOUNTER — Ambulatory Visit (INDEPENDENT_AMBULATORY_CARE_PROVIDER_SITE_OTHER): Payer: Self-pay | Admitting: Physician Assistant

## 2015-11-01 VITALS — BP 131/88 | HR 75 | Ht 71.0 in | Wt 287.0 lb

## 2015-11-01 DIAGNOSIS — E291 Testicular hypofunction: Secondary | ICD-10-CM

## 2015-11-01 MED ORDER — TESTOSTERONE CYPIONATE 200 MG/ML IM SOLN
200.0000 mg | INTRAMUSCULAR | Status: DC
  Administered 2015-11-01: 200 mg via INTRAMUSCULAR

## 2015-11-01 NOTE — Progress Notes (Signed)
Patient was given testosterone injection. 200 mg od Depotestoterone Cypionate was given RUOQ. Patient denied any headaches, shortness of breath, dizziness or anything abnormal due to the injection. Rhonda Cunningham,CMA

## 2015-11-18 ENCOUNTER — Ambulatory Visit (INDEPENDENT_AMBULATORY_CARE_PROVIDER_SITE_OTHER): Payer: Self-pay | Admitting: Physician Assistant

## 2015-11-18 VITALS — BP 129/74 | HR 64 | Wt 281.0 lb

## 2015-11-18 DIAGNOSIS — E291 Testicular hypofunction: Secondary | ICD-10-CM

## 2015-11-18 MED ORDER — TESTOSTERONE CYPIONATE 200 MG/ML IM SOLN
250.0000 mg | Freq: Once | INTRAMUSCULAR | Status: AC
  Administered 2015-11-18: 250 mg via INTRAMUSCULAR

## 2015-11-18 NOTE — Progress Notes (Signed)
   Subjective:    Patient ID: Brandon Mckee, male    DOB: 01-11-1966, 49 y.o.   MRN: [id number]  HPI  Brandon Mckee is here for a testosterone injection. Denies chest pain, shortness of breath, headaches or mood changes.    Review of Systems     Objective:   Physical Exam        Assessment & Plan:  Patient tolerated injection well without complications. Patient advised to schedule next injection 14 days from today.

## 2015-11-23 ENCOUNTER — Other Ambulatory Visit: Payer: Self-pay | Admitting: Physician Assistant

## 2015-12-06 ENCOUNTER — Ambulatory Visit: Payer: BLUE CROSS/BLUE SHIELD | Admitting: Physician Assistant

## 2015-12-06 ENCOUNTER — Ambulatory Visit (INDEPENDENT_AMBULATORY_CARE_PROVIDER_SITE_OTHER): Payer: Self-pay | Admitting: Sports Medicine

## 2015-12-06 VITALS — BP 118/78 | HR 74 | Wt 283.0 lb

## 2015-12-06 DIAGNOSIS — E291 Testicular hypofunction: Secondary | ICD-10-CM

## 2015-12-06 MED ORDER — TESTOSTERONE CYPIONATE 200 MG/ML IM SOLN
250.0000 mg | Freq: Once | INTRAMUSCULAR | Status: AC
  Administered 2015-12-06: 250 mg via INTRAMUSCULAR

## 2015-12-06 NOTE — Progress Notes (Signed)
   Subjective:    Patient ID: Brandon Mckee, male    DOB: 1966-10-18, 49 y.o.   MRN: [id number]  HPI  Bauer Malinsky Copus is here for a testosterone injection. Denies chest pain, shortness of breath, headaches or mood changes.   Review of Systems     Objective:   Physical Exam        Assessment & Plan:  Patient tolerated injection well without complications. Patient advised to schedule next injection 14 days from today.

## 2015-12-12 MED ORDER — TESTOSTERONE CYPIONATE 200 MG/ML IM SOLN
250.0000 mg | Freq: Once | INTRAMUSCULAR | Status: AC
  Administered 2015-10-11: 250 mg via INTRAMUSCULAR

## 2015-12-12 NOTE — Addendum Note (Signed)
Addended by: Narda Decarolis on: 12/12/2015 02:36 PM   Modules accepted: Orders

## 2015-12-20 ENCOUNTER — Ambulatory Visit (INDEPENDENT_AMBULATORY_CARE_PROVIDER_SITE_OTHER): Payer: Self-pay | Admitting: Physician Assistant

## 2015-12-20 VITALS — BP 144/92 | HR 76

## 2015-12-20 DIAGNOSIS — E291 Testicular hypofunction: Secondary | ICD-10-CM

## 2015-12-20 MED ORDER — TESTOSTERONE CYPIONATE 200 MG/ML IM SOLN
250.0000 mg | Freq: Once | INTRAMUSCULAR | Status: AC
  Administered 2015-12-20: 250 mg via INTRAMUSCULAR

## 2015-12-20 NOTE — Progress Notes (Signed)
Selassie presents to clinic for a testosterone injection.  Pt tolerated well without any complications. -Izell Elkton, RMA

## 2015-12-25 ENCOUNTER — Ambulatory Visit (INDEPENDENT_AMBULATORY_CARE_PROVIDER_SITE_OTHER): Payer: BLUE CROSS/BLUE SHIELD | Admitting: Osteopathic Medicine

## 2015-12-25 ENCOUNTER — Encounter: Payer: Self-pay | Admitting: Osteopathic Medicine

## 2015-12-25 VITALS — BP 154/102 | HR 108 | Temp 97.8°F | Wt 285.0 lb

## 2015-12-25 DIAGNOSIS — R059 Cough, unspecified: Secondary | ICD-10-CM

## 2015-12-25 DIAGNOSIS — I10 Essential (primary) hypertension: Secondary | ICD-10-CM | POA: Diagnosis not present

## 2015-12-25 DIAGNOSIS — J069 Acute upper respiratory infection, unspecified: Secondary | ICD-10-CM | POA: Diagnosis not present

## 2015-12-25 DIAGNOSIS — R05 Cough: Secondary | ICD-10-CM | POA: Diagnosis not present

## 2015-12-25 MED ORDER — BENZONATATE 200 MG PO CAPS: 200.0000 mg | ORAL_CAPSULE | Freq: Three times a day (TID) | ORAL | Status: DC | PRN

## 2015-12-25 MED ORDER — AMLODIPINE BESYLATE 10 MG PO TABS: 10.0000 mg | ORAL_TABLET | Freq: Every day | ORAL | Status: DC

## 2015-12-25 MED ORDER — CARVEDILOL 25 MG PO TABS: 25.0000 mg | ORAL_TABLET | Freq: Two times a day (BID) | ORAL | Status: DC

## 2015-12-25 MED ORDER — LISINOPRIL-HYDROCHLOROTHIAZIDE 20-25 MG PO TABS: 1.0000 | ORAL_TABLET | Freq: Every day | ORAL | Status: DC

## 2015-12-25 MED ORDER — IPRATROPIUM BROMIDE 0.03 % NA SOLN: 2.0000 | Freq: Two times a day (BID) | NASAL | Status: DC

## 2015-12-25 MED ORDER — METHYLPREDNISOLONE 4 MG PO TBPK: ORAL_TABLET | ORAL | Status: DC

## 2015-12-25 NOTE — Progress Notes (Signed)
HPI: Brandon Mckee is a 50 y.o. male who presents to [facility]  today for chief complaint of:  Chief Complaint  Patient presents with  . Cough   Cough/cold symptoms: . Location: sinuses . Quality: pressure, headache, runny nose  . Duration: 2 days . Context: yes sick contacts, no recent travel . Modifying factors: has tried the following OTC medications: children's allergy pill when started feeling bad  without relief . Assoc signs/symptoms: no fever/chills, no productive cough, No  body aches, No  GI upset  Hypertension: Patient has been out of blood pressure medications, no chest pain pressure palpitations, no dizziness.   Past medical, social and family history reviewed: Past Medical History  Diagnosis Date  . Hyperlipidemia   . Metabolic syndrome     IFG  . Hypertension   . Pes anserine bursitis     bilateral   Past Surgical History  Procedure Laterality Date  . Appendectomy     Social History  Substance Use Topics  . Smoking status: Never Smoker   . Smokeless tobacco: Not on file  . Alcohol Use: Not on file   Family History  Problem Relation Age of Onset  . Hyperlipidemia Mother   . Hypertension Mother   . Hypertension Father   . Hyperlipidemia Father   . Obesity Sister   . Hypertension Sister     Current Outpatient Prescriptions  Medication Sig Dispense Refill  . albuterol (PROAIR HFA) 108 (90 BASE) MCG/ACT inhaler Inhale 2 puffs into the lungs every 6 (six) hours as needed.      . AMBULATORY NON FORMULARY MEDICATION Please evaluate pt for pressure change on CPAP. 1 application 0  . AMBULATORY NON FORMULARY MEDICATION Glucometer(relion) with test strips and lancets.   DM type II testing once daily. 1 Device 0  . amLODipine (NORVASC) 10 MG tablet TAKE ONE TABLET BY MOUTH ONE TIME DAILY 30 tablet 5  . aspirin 81 MG EC tablet Take 81 mg by mouth daily.      [name] atorvastatin (LIPITOR) 40 MG tablet TAKE ONE TABLET BY  MOUTH ONE TIME DAILY 30 tablet 5  . azelastine (OPTIVAR) 0.05 % ophthalmic solution Place 1 drop into both eyes 2 (two) times daily. 6 mL 12  . carvedilol (COREG) 25 MG tablet Take 1 tablet (25 mg total) by mouth 2 (two) times daily with a meal. 60 tablet 5  . clotrimazole-betamethasone (LOTRISONE) cream Apply topically 2 (two) times daily. (Patient taking differently: Apply topically as needed. ) 45 g 0  . cyclobenzaprine (FLEXERIL) 5 MG tablet TAKE ONE TABLET BY MOUTH NIGHTLY AT BEDTIME AS NEEDED FOR MUSCLE SPASMS 30 tablet 5  . Dapagliflozin-Metformin HCl ER (XIGDUO XR) 09-999 MG TB24 Take 1 tablet by mouth daily. 30 tablet 4  . furosemide (LASIX) 20 MG tablet One by mouth daily as needed for leg swelling. 30 tablet 3  . gabapentin (NEURONTIN) 100 MG capsule Take 1 capsule (100 mg total) by mouth daily. 30 capsule 5  . lisinopril-hydrochlorothiazide (PRINZIDE,ZESTORETIC) 20-25 MG per tablet Take 1 tablet by mouth daily. 30 tablet 6  . meloxicam (MOBIC) 15 MG tablet One tab PO qAM with breakfast for 2 weeks, then daily prn pain. 30 tablet 3  . niacin (NIASPAN) 500 MG CR tablet Take 1 tablet (500 mg total) by mouth at bedtime. 30 tablet 6  . omeprazole (PRILOSEC) 40 MG capsule Take 1 capsule (40 mg total) by mouth daily. 30 capsule 11  . ranitidine (ZANTAC)  150 MG tablet Take 1 tablet (150 mg total) by mouth at bedtime. 30 tablet 2  . testosterone cypionate (DEPOTESTOTERONE CYPIONATE) 200 MG/ML injection Inject 250 mg into the muscle every 14 (fourteen) days.     No current facility-administered medications for this visit.   Allergies  Allergen Reactions  . Penicillins       Review of Systems: CONSTITUTIONAL: no fever/chills HEAD/EYES/EARS/NOSE/THROAT: yes headache, no vision change or hearing change, yes sore throat CARDIAC: No chest pain/pressure/palpitations, no orthopnea RESPIRATORY: yes cough, no shortness of breath GASTROINTESTINAL: no nausea, no vomiting, no abdominal pain/blood  in stool/diarrhea/constipation MUSCULOSKELETAL: no myalgia/arthralgia   Exam:  BP 154/102 mmHg  Pulse 108  Temp(Src) 97.8 F (36.6 C) (Oral)  Wt 285 lb (129.275 kg) Constitutional: VSS, see above. General Appearance: alert, well-developed, well-nourished, NAD Eyes: Normal lids and conjunctive, non-icteric sclera, PERRLA Ears, Nose, Mouth, Throat: Normal external inspection ears/nares/mouth/lips/gums, normal TM, MMM; posterior pharynx without erythema, without exudate Neck: No masses, trachea midline. No thyroid enlargement/tenderness/mass appreciated, normal lymph nodes Respiratory: Normal respiratory effort. No  wheeze/rhonchi/rales Cardiovascular: S1/S2 normal, no murmur/rub/gallop auscultated. RRR. No carotid bruit or JVD. No lower extremity edema.     ASSESSMENT/PLAN: Supportive care and symptomatically treatment for viral upper respiratory infection as below, patient states he has been out of his blood pressure medications due to having to reschedule an appointment with Luvenia Starch, I refilled these medications for 30 days, patient has appointment upcoming, I asked him to speak with [facility] regarding any further refills and other chronic medical problems. She counseled to avoid decongestants and NSAIDs until blood pressure under better control  Viral URI - Plan: ipratropium (ATROVENT) 0.03 % nasal spray, methylPREDNISolone (MEDROL DOSEPAK) 4 MG TBPK tablet  Cough - Plan: benzonatate (TESSALON) 200 MG capsule  Essential hypertension - Plan: lisinopril-hydrochlorothiazide (PRINZIDE,ZESTORETIC) 20-25 MG tablet, carvedilol (COREG) 25 MG tablet, amLODipine (NORVASC) 10 MG tablet   Return if symptoms worsen or fail to improve.

## 2015-12-25 NOTE — Patient Instructions (Signed)
Can take antihistamines such as claritin or zyrtec or benadryl, avoid any of these with "D" at the end because these have decongestant medicine, which you should avoid until your blood pressure is better controlled. Also avoid pain medications except for Tylenol, you can take Tylenol to help with pain and inflammation while your body fights the cold. Keep your appointment with Brandon Mckee to get further refills of blood pressure meds and discuss other chronic problems.

## 2016-01-03 ENCOUNTER — Ambulatory Visit (INDEPENDENT_AMBULATORY_CARE_PROVIDER_SITE_OTHER): Payer: BLUE CROSS/BLUE SHIELD | Admitting: Physician Assistant

## 2016-01-03 ENCOUNTER — Encounter: Payer: Self-pay | Admitting: Physician Assistant

## 2016-01-03 VITALS — BP 128/82 | HR 87 | Ht 71.0 in | Wt 284.0 lb

## 2016-01-03 DIAGNOSIS — E8881 Metabolic syndrome: Secondary | ICD-10-CM | POA: Diagnosis not present

## 2016-01-03 DIAGNOSIS — F329 Major depressive disorder, single episode, unspecified: Secondary | ICD-10-CM

## 2016-01-03 DIAGNOSIS — M170 Bilateral primary osteoarthritis of knee: Secondary | ICD-10-CM

## 2016-01-03 DIAGNOSIS — E119 Type 2 diabetes mellitus without complications: Secondary | ICD-10-CM | POA: Diagnosis not present

## 2016-01-03 DIAGNOSIS — F32A Depression, unspecified: Secondary | ICD-10-CM

## 2016-01-03 DIAGNOSIS — E291 Testicular hypofunction: Secondary | ICD-10-CM

## 2016-01-03 DIAGNOSIS — I10 Essential (primary) hypertension: Secondary | ICD-10-CM | POA: Insufficient documentation

## 2016-01-03 DIAGNOSIS — F419 Anxiety disorder, unspecified: Secondary | ICD-10-CM

## 2016-01-03 LAB — CBC WITH DIFFERENTIAL/PLATELET
BASOS ABS: 0 10*3/uL (ref 0.0–0.1)
Basophils Relative: 0 % (ref 0–1)
EOS PCT: 0 % (ref 0–5)
Eosinophils Absolute: 0 10*3/uL (ref 0.0–0.7)
HCT: 49.7 % (ref 39.0–52.0)
HEMOGLOBIN: 16.7 g/dL (ref 13.0–17.0)
LYMPHS ABS: 1.1 10*3/uL (ref 0.7–4.0)
LYMPHS PCT: 16 % (ref 12–46)
MCH: 29.7 pg (ref 26.0–34.0)
MCHC: 33.6 g/dL (ref 30.0–36.0)
MCV: 88.4 fL (ref 78.0–100.0)
MPV: 11.3 fL (ref 8.6–12.4)
Monocytes Absolute: 0.4 10*3/uL (ref 0.1–1.0)
Monocytes Relative: 6 % (ref 3–12)
NEUTROS ABS: 5.3 10*3/uL (ref 1.7–7.7)
Neutrophils Relative %: 78 % — ABNORMAL HIGH (ref 43–77)
Platelets: 175 10*3/uL (ref 150–400)
RBC: 5.62 MIL/uL (ref 4.22–5.81)
RDW: 14.3 % (ref 11.5–15.5)
WBC: 6.8 10*3/uL (ref 4.0–10.5)

## 2016-01-03 LAB — POCT GLYCOSYLATED HEMOGLOBIN (HGB A1C): Hemoglobin A1C: 7.2

## 2016-01-03 LAB — HEMOGLOBIN A1C
Hgb A1c MFr Bld: 7.2 % — ABNORMAL HIGH (ref <5.7)
Mean Plasma Glucose: 160 mg/dL — ABNORMAL HIGH (ref <117)

## 2016-01-03 MED ORDER — CARVEDILOL 25 MG PO TABS: 25.0000 mg | ORAL_TABLET | Freq: Two times a day (BID) | ORAL | Status: DC

## 2016-01-03 MED ORDER — RANITIDINE HCL 150 MG PO TABS: 150.0000 mg | ORAL_TABLET | Freq: Every day | ORAL | Status: DC

## 2016-01-03 MED ORDER — NIACIN ER (ANTIHYPERLIPIDEMIC) 500 MG PO TBCR: 500.0000 mg | EXTENDED_RELEASE_TABLET | Freq: Every day | ORAL | Status: DC

## 2016-01-03 MED ORDER — MELOXICAM 15 MG PO TABS: ORAL_TABLET | ORAL | Status: DC

## 2016-01-03 MED ORDER — CYCLOBENZAPRINE HCL 5 MG PO TABS: ORAL_TABLET | ORAL | Status: DC

## 2016-01-03 MED ORDER — OMEPRAZOLE 40 MG PO CPDR: 40.0000 mg | DELAYED_RELEASE_CAPSULE | Freq: Every day | ORAL | Status: DC

## 2016-01-03 MED ORDER — FLUOXETINE HCL 20 MG PO TABS: 20.0000 mg | ORAL_TABLET | Freq: Every day | ORAL | Status: DC

## 2016-01-03 MED ORDER — TESTOSTERONE CYPIONATE 200 MG/ML IM SOLN
200.0000 mg | Freq: Once | INTRAMUSCULAR | Status: AC
  Administered 2016-01-03: 200 mg via INTRAMUSCULAR

## 2016-01-03 MED ORDER — ATORVASTATIN CALCIUM 40 MG PO TABS: 40.0000 mg | ORAL_TABLET | Freq: Every day | ORAL | Status: DC

## 2016-01-03 MED ORDER — LISINOPRIL-HYDROCHLOROTHIAZIDE 20-25 MG PO TABS
1.0000 | ORAL_TABLET | Freq: Every day | ORAL | Status: DC
Start: 2016-01-03 — End: 2016-06-25

## 2016-01-03 MED ORDER — GABAPENTIN 100 MG PO CAPS: 100.0000 mg | ORAL_CAPSULE | Freq: Every day | ORAL | Status: DC

## 2016-01-03 MED ORDER — AMLODIPINE BESYLATE 10 MG PO TABS: 10.0000 mg | ORAL_TABLET | Freq: Every day | ORAL | Status: DC

## 2016-01-03 MED ORDER — DAPAGLIFLOZIN PRO-METFORMIN ER 10-1000 MG PO TB24: 1.0000 | ORAL_TABLET | Freq: Every day | ORAL | Status: DC

## 2016-01-03 NOTE — Progress Notes (Signed)
   Subjective:    Patient ID: Brandon Mckee, male    DOB: [birth year], 50 y.o.   MRN: [id number]  HPI  Pt is a 50 yo male who presents to the clinic for 3 month follow up.   DM- not checking sugars. Not taking xigduo because card expired and became too expensive. Denies any hypoglycemia. Not exercising or eating right. Denies any vision changes or open sores or lesions. No recent eye exam.   RLS- controlled with gabapentin at bedtime.   HTN- no CP, palpitations, headaches, dizziness, or vision changes. Taking medication daily.   Hyperlipidemia- needs medication refilled.   Hypogonadism- here for 2 week injection.   Bilateral knee pain- known osteoarthritis. Refilled mobic. Hx of supratz injections that did help.   Right shoulder pain- having some on and off right shoulder pain. He does a lot of heavy lifting with his morbidly obese wife.   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 He is noticing he is more sad. He has stopped all things he enjoyed because he has to be at home and take care of his wife. He wonders if something could help his mood. The more sad he gets the more angry he gets. He tends to snap at his wife and kids more.   Review of Systems  All other systems reviewed and are negative.      Objective:   Physical Exam  Constitutional: He is oriented to person, place, and time. He appears well-developed and well-nourished.  Obese.   HENT:  Head: Normocephalic and atraumatic.  Cardiovascular: Normal rate, regular rhythm and normal heart sounds.   Pulmonary/Chest: Effort normal and breath sounds normal.  Neurological: He is alert and oriented to person, place, and time.  Psychiatric: He has a normal mood and affect. His behavior is normal.          Assessment & Plan:  DM type II- last a1c was 6.7 and today 7.2. Pt has not been taking xigduo because coupon discount expired and cannot afford. Will get pt back on card.   Discussed diabetic diet.  Foot exam normal.  Discussed need eye exam.  On ace.   HTN- rechecked and normal. Refilled medication for 6 months. cmp ordered.   Hyperlipidemia- did not get labs drawn when printed last. Re-printed labs.   hypogonadism- needs labs checked. Reprinted to get drawn today. Testosterone shot given today.  Obstructive sleep apnea- encouraged pt to start using CPAP.   Restless leg- controlled. Refilled neurontin at bedtime.   Bilateral knee pain/right shoulder pain- mobic refilled. Did not have time to address today. Please follow up with dr. Darene Lamer.   Depression/anxiety- PHQ-7 was 7. GAD-7 was 6. Started on prozac daily. Discussed side effects. Follow up in 4-6 weeks.

## 2016-01-04 LAB — PSA, TOTAL AND FREE
PSA FREE: 0.21 ng/mL
PSA, Free Pct: 17 % — ABNORMAL LOW (ref >25)
PSA: 1.22 ng/mL (ref <=4.00)

## 2016-01-04 LAB — LIPID PANEL
Cholesterol: 217 mg/dL — ABNORMAL HIGH (ref 125–200)
HDL: 25 mg/dL — ABNORMAL LOW (ref >=40)
LDL CALC: 154 mg/dL — AB (ref <130)
Total CHOL/HDL Ratio: 8.7 Ratio — ABNORMAL HIGH (ref <=5.0)
Triglycerides: 190 mg/dL — ABNORMAL HIGH (ref <150)
VLDL: 38 mg/dL — AB (ref <30)

## 2016-01-04 LAB — COMPLETE METABOLIC PANEL WITH GFR
ALBUMIN: 4 g/dL (ref 3.6–5.1)
ALT: 33 U/L (ref 9–46)
AST: 17 U/L (ref 10–40)
Alkaline Phosphatase: 107 U/L (ref 40–115)
BILIRUBIN TOTAL: 0.7 mg/dL (ref 0.2–1.2)
BUN: 9 mg/dL (ref 7–25)
CALCIUM: 8.5 mg/dL — AB (ref 8.6–10.3)
CHLORIDE: 103 mmol/L (ref 98–110)
CO2: 26 mmol/L (ref 20–31)
Creat: 0.75 mg/dL (ref 0.60–1.35)
GFR, Est Non African American: >89 mL/min (ref >=60)
Glucose, Bld: 144 mg/dL — ABNORMAL HIGH (ref 65–99)
Potassium: 4 mmol/L (ref 3.5–5.3)
Sodium: 140 mmol/L (ref 135–146)
TOTAL PROTEIN: 6.5 g/dL (ref 6.1–8.1)

## 2016-01-04 LAB — VITAMIN D 25 HYDROXY (VIT D DEFICIENCY, FRACTURES): Vit D, 25-Hydroxy: 7 ng/mL — ABNORMAL LOW (ref 30–100)

## 2016-01-04 LAB — VITAMIN B12: Vitamin B-12: 316 pg/mL (ref 211–911)

## 2016-01-06 LAB — TESTOS,TOTAL,FREE AND SHBG (FEMALE)

## 2016-01-06 LAB — TESTOSTERONE, FREE, TOTAL, SHBG
Sex Hormone Binding: 14 nmol/L (ref 10–50)
TESTOSTERONE FREE: 50.6 pg/mL (ref 47.0–244.0)
TESTOSTERONE-% FREE: 2.8 % (ref 1.6–2.9)
TESTOSTERONE: 179 ng/dL — AB (ref 250–827)

## 2016-01-07 ENCOUNTER — Encounter: Payer: Self-pay | Admitting: Physician Assistant

## 2016-01-07 DIAGNOSIS — E785 Hyperlipidemia, unspecified: Secondary | ICD-10-CM | POA: Insufficient documentation

## 2016-01-07 DIAGNOSIS — E559 Vitamin D deficiency, unspecified: Secondary | ICD-10-CM | POA: Insufficient documentation

## 2016-01-17 ENCOUNTER — Ambulatory Visit (INDEPENDENT_AMBULATORY_CARE_PROVIDER_SITE_OTHER): Payer: BLUE CROSS/BLUE SHIELD | Admitting: Sports Medicine

## 2016-01-17 ENCOUNTER — Encounter: Payer: Self-pay | Admitting: Sports Medicine

## 2016-01-17 ENCOUNTER — Telehealth: Payer: Self-pay | Admitting: Sports Medicine

## 2016-01-17 ENCOUNTER — Ambulatory Visit (INDEPENDENT_AMBULATORY_CARE_PROVIDER_SITE_OTHER): Payer: BLUE CROSS/BLUE SHIELD

## 2016-01-17 VITALS — BP 150/90 | HR 76 | Temp 98.2°F | Resp 18 | Wt 284.0 lb

## 2016-01-17 DIAGNOSIS — E291 Testicular hypofunction: Secondary | ICD-10-CM

## 2016-01-17 DIAGNOSIS — M19011 Primary osteoarthritis, right shoulder: Secondary | ICD-10-CM

## 2016-01-17 DIAGNOSIS — M170 Bilateral primary osteoarthritis of knee: Secondary | ICD-10-CM

## 2016-01-17 DIAGNOSIS — M1990 Unspecified osteoarthritis, unspecified site: Secondary | ICD-10-CM

## 2016-01-17 MED ORDER — TESTOSTERONE CYPIONATE 200 MG/ML IM SOLN
200.0000 mg | Freq: Once | INTRAMUSCULAR | Status: AC
  Administered 2016-01-17: 200 mg via INTRAMUSCULAR

## 2016-01-17 NOTE — Addendum Note (Signed)
Addended by: Elizabeth Sauer on: 01/17/2016 09:08 AM   Modules accepted: Orders

## 2016-01-17 NOTE — Telephone Encounter (Signed)
-----   Message from Silverio Decamp, MD sent at 01/17/2016  8:53 AM EST ----- Hey [place],  Left knee OA, failed steroids, nsaids, good response to visco in the past 2 years, ago, lets get approved for orthovisc.  Preesh. ___________________________________________ Gwen Her. Dianah Field, M.D., ABFM., CAQSM. Primary Care and [place] Instructor of [place] [employer] [facility]

## 2016-01-17 NOTE — Telephone Encounter (Signed)
Submitted for approval on Orthovisc. Awaiting confirmation.  

## 2016-01-17 NOTE — Progress Notes (Signed)
  Subjective:    CC: Right shoulder pain  HPI: This is a pleasant 50 year old male, for sometime he's noted an enlarging not on the top of his right shoulder, he also has some pain with abduction and adduction of his shoulder across his chest. Pain is moderate, persistent without radiation, no trauma.  Left knee pain: Did well with a series of Supartz 2 years ago, desires to restart.  Male hypogonadism: Needs testosterone injection today.  Past medical history, Surgical history, Family history not pertinant except as noted below, Social history, Allergies, and medications have been entered into the medical record, reviewed, and no changes needed.   Review of Systems: No fevers, chills, night sweats, weight loss, chest pain, or shortness of breath.   Objective:    General: Well Developed, well nourished, and in no acute distress.  Neuro: Alert and oriented x3, extra-ocular muscles intact, sensation grossly intact.  HEENT: Normocephalic, atraumatic, pupils equal round reactive to light, neck supple, no masses, no lymphadenopathy, thyroid nonpalpable.  Skin: Warm and dry, no rashes. Cardiac: Regular rate and rhythm, no murmurs rubs or gallops, no lower extremity edema.  Respiratory: Clear to auscultation bilaterally. Not using accessory muscles, speaking in full sentences. Right Shoulder: Visible fullness over the acromioclavicular joint with tenderness over the acromion navicular joint and a positive cross arm sign. ROM is full in all planes. Rotator cuff strength normal throughout. No signs of impingement with negative Neer and Hawkin's tests, empty can. Speeds and Yergason's tests normal. No labral pathology noted with negative Obrien's, negative crank, negative clunk, and good stability. Normal scapular function observed. No painful arc and no drop arm sign. No apprehension sign  Procedure: Real-time Ultrasound Guided Injection of right acromioclavicular joint Device: GE Logiq E    Verbal informed consent obtained.  Time-out conducted.  Noted no overlying erythema, induration, or other signs of local infection.  Skin prepped in a sterile fashion.  Local anesthesia: Topical Ethyl chloride.  With sterile technique and under real time ultrasound guidance:  1/2 mL Kenalog 40, 1/2 mL lidocaine injected easily. Completed without difficulty  Pain immediately resolved suggesting accurate placement of the medication.  Advised to call if fevers/chills, erythema, induration, drainage, or persistent bleeding.  Images permanently stored and available for review in the ultrasound unit.  Impression: Technically successful ultrasound guided injection.  Impression and Recommendations:

## 2016-01-17 NOTE — Assessment & Plan Note (Signed)
Deitrick had a 2 year response to his previous [place] series, desires to restart viscous supplementation. Left knee only

## 2016-01-17 NOTE — Assessment & Plan Note (Signed)
Minimal impingement signs, positive cross arm sign and pain over the acromioclavicular joint.  Injected today, x-rays, return to see me in one month.

## 2016-01-20 ENCOUNTER — Other Ambulatory Visit: Payer: Self-pay | Admitting: Osteopathic Medicine

## 2016-01-20 MED ORDER — SODIUM HYALURONATE (VISCOSUP) 20 MG/2ML IX SOSY: 1.0000 | PREFILLED_SYRINGE | INTRA_ARTICULAR | Status: DC

## 2016-01-20 NOTE — Addendum Note (Signed)
Addended by: Silverio Decamp on: 01/20/2016 09:37 AM   Modules accepted: Orders

## 2016-01-20 NOTE — Telephone Encounter (Signed)
Euflexxa sent to [place].

## 2016-01-20 NOTE — Telephone Encounter (Signed)
Received the following information from the [employer] benefits investigation:   PATIENT HAS BLUE VALUE PPO FULLY FUNDED PLAN WITH AN EFFECTIVE DATE OF 12/22/2015. ORTHOVISC/ MONOVISC IS NOT THE PREFERRED DRUG THROUGH [employer]  AND WILL NOT BE COVERED UNTIL PATIENT HAS TRIED AND FAILED WITH THE PREFERRED DRUG. PRE-CERT WOULD BE REQUIRED AFTER TRYING AND Brandon Mckee WITH PREFERRED, PRE-CERT PHONE # IS [phone]. U9437 IS COVERED AT 100% & [id number] IS COVERED AT 100% OF THE CONTRACTED RATE WHEN PERFORMED IN AN OFFICE SETTING. IF OUT OF POCKET IS MET, COPAY WILL BE WAIVED. $20.00 COPAY FOR SPECIALIST OFFICE VISIT APPLIES, IF BILLED. [id number].  Will route to ordering Provider for different injectable drug to be ordered. This Rx will have to go through [facility]) Radiographer, therapeutic. Then, pre-cert will be completed.

## 2016-01-21 NOTE — Telephone Encounter (Signed)
Pt advised of different Rx being sent to specialty pharmacy. Will complete pre-auth form when it gets sent to office. Pt advised he will be contacted from the speciality pharmacy regarding payment, verbalized understanding.

## 2016-01-30 ENCOUNTER — Telehealth: Payer: Self-pay | Admitting: Sports Medicine

## 2016-01-30 NOTE — Telephone Encounter (Signed)
Received fax for Euflexxa sent through cover my meds waiting on authorization. - CF

## 2016-01-31 ENCOUNTER — Ambulatory Visit (INDEPENDENT_AMBULATORY_CARE_PROVIDER_SITE_OTHER): Payer: BLUE CROSS/BLUE SHIELD | Admitting: Physician Assistant

## 2016-01-31 ENCOUNTER — Other Ambulatory Visit: Payer: Self-pay

## 2016-01-31 VITALS — BP 116/70 | HR 70 | Wt 278.0 lb

## 2016-01-31 DIAGNOSIS — E291 Testicular hypofunction: Secondary | ICD-10-CM

## 2016-01-31 DIAGNOSIS — M170 Bilateral primary osteoarthritis of knee: Secondary | ICD-10-CM

## 2016-01-31 MED ORDER — TESTOSTERONE CYPIONATE 200 MG/ML IM SOLN
200.0000 mg | Freq: Once | INTRAMUSCULAR | Status: AC
  Administered 2016-01-31: 200 mg via INTRAMUSCULAR

## 2016-01-31 NOTE — Telephone Encounter (Signed)
Received fax from [facility] and the medication is covered under the patient's medical not pharmacy so we are resending script to have medication sent to [place]. - CF

## 2016-01-31 NOTE — Progress Notes (Signed)
Patient came into office today for testosterone injection. Denies chest pain, shortness of breath, headaches and problems associated with taking this medication. Patient states he has had no abnornal mood swings. Patient tolerated injection in [place] well without complications. Patient advised to schedule his next injection for 2 weeks from today. 

## 2016-02-14 ENCOUNTER — Ambulatory Visit (INDEPENDENT_AMBULATORY_CARE_PROVIDER_SITE_OTHER): Payer: BLUE CROSS/BLUE SHIELD | Admitting: Sports Medicine

## 2016-02-14 VITALS — BP 122/62 | HR 86 | Resp 16 | Wt 279.3 lb

## 2016-02-14 DIAGNOSIS — M1990 Unspecified osteoarthritis, unspecified site: Secondary | ICD-10-CM | POA: Diagnosis not present

## 2016-02-14 DIAGNOSIS — M19011 Primary osteoarthritis, right shoulder: Secondary | ICD-10-CM

## 2016-02-14 DIAGNOSIS — M170 Bilateral primary osteoarthritis of knee: Secondary | ICD-10-CM | POA: Diagnosis not present

## 2016-02-14 DIAGNOSIS — E291 Testicular hypofunction: Secondary | ICD-10-CM

## 2016-02-14 MED ORDER — TESTOSTERONE CYPIONATE 200 MG/ML IM SOLN
200.0000 mg | Freq: Once | INTRAMUSCULAR | Status: AC
  Administered 2016-02-14: 200 mg via INTRAMUSCULAR

## 2016-02-14 NOTE — Progress Notes (Signed)
  Subjective:    CC: Follow-up  HPI: Right acromioclavicular arthritis: Completely resolved after guided injection at the last visit.  Knee osteoarthritis: Still waiting on Euflexxa syringes to arrive. They have been approved.  Past medical history, Surgical history, Family history not pertinant except as noted below, Social history, Allergies, and medications have been entered into the medical record, reviewed, and no changes needed.   Review of Systems: No fevers, chills, night sweats, weight loss, chest pain, or shortness of breath.   Objective:    General: Well Developed, well nourished, and in no acute distress.  Neuro: Alert and oriented x3, extra-ocular muscles intact, sensation grossly intact.  HEENT: Normocephalic, atraumatic, pupils equal round reactive to light, neck supple, no masses, no lymphadenopathy, thyroid nonpalpable.  Skin: Warm and dry, no rashes. Cardiac: Regular rate and rhythm, no murmurs rubs or gallops, no lower extremity edema.  Respiratory: Clear to auscultation bilaterally. Not using accessory muscles, speaking in full sentences.  Impression and Recommendations:

## 2016-02-14 NOTE — Assessment & Plan Note (Signed)
Completely resolved after right acromioclavicular joint injection at the last visit, return as needed for this.

## 2016-02-14 NOTE — Assessment & Plan Note (Signed)
Sounds like approval obtained but still waiting on Euflexxa syringes.

## 2016-02-25 ENCOUNTER — Other Ambulatory Visit: Payer: Self-pay | Admitting: Osteopathic Medicine

## 2016-02-28 ENCOUNTER — Ambulatory Visit (INDEPENDENT_AMBULATORY_CARE_PROVIDER_SITE_OTHER): Payer: BLUE CROSS/BLUE SHIELD | Admitting: Family Medicine

## 2016-02-28 VITALS — BP 143/81 | HR 72 | Wt 276.0 lb

## 2016-02-28 DIAGNOSIS — E291 Testicular hypofunction: Secondary | ICD-10-CM | POA: Diagnosis not present

## 2016-02-28 MED ORDER — TESTOSTERONE CYPIONATE 200 MG/ML IM SOLN
250.0000 mg | Freq: Once | INTRAMUSCULAR | Status: AC
  Administered 2016-02-28: 250 mg via INTRAMUSCULAR

## 2016-02-28 NOTE — Progress Notes (Signed)
Agree with below. \Abid Bolla, MD  

## 2016-02-28 NOTE — Progress Notes (Signed)
Patient came into office today for testosterone injection. Denies chest pain, shortness of breath, headaches and problems associated with taking this medication. Patient states he has had no abnornal mood swings. Patient tolerated injection in [place] well without complications. Patient advised to schedule his next injection for 2 weeks from today. 

## 2016-03-01 ENCOUNTER — Other Ambulatory Visit: Payer: Self-pay | Admitting: Physician Assistant

## 2016-03-02 ENCOUNTER — Other Ambulatory Visit: Payer: Self-pay | Admitting: Physician Assistant

## 2016-03-02 ENCOUNTER — Other Ambulatory Visit: Payer: Self-pay | Admitting: Osteopathic Medicine

## 2016-03-13 ENCOUNTER — Ambulatory Visit (INDEPENDENT_AMBULATORY_CARE_PROVIDER_SITE_OTHER): Payer: BLUE CROSS/BLUE SHIELD | Admitting: Physician Assistant

## 2016-03-13 VITALS — BP 143/91 | HR 67 | Wt 278.0 lb

## 2016-03-13 DIAGNOSIS — E291 Testicular hypofunction: Secondary | ICD-10-CM | POA: Diagnosis not present

## 2016-03-13 MED ORDER — TESTOSTERONE CYPIONATE 200 MG/ML IM SOLN
200.0000 mg | Freq: Once | INTRAMUSCULAR | Status: AC
  Administered 2016-03-13: 200 mg via INTRAMUSCULAR

## 2016-03-13 NOTE — Progress Notes (Signed)
Brandon Mckee is here for a testosterone injection. Denies chest pain, shortness of breath, headaches or mood changes. His blood pressure is slightly elevated. He took his blood pressure medication just before coming in.    Patient tolerated injection well without complications. Patient advised to schedule next injection 30 days from today. He was advised to take his blood pressure medication an hour before coming in. We will hold the next testosterone injection if blood pressure is elevated.

## 2016-03-19 ENCOUNTER — Other Ambulatory Visit: Payer: Self-pay | Admitting: Osteopathic Medicine

## 2016-03-27 ENCOUNTER — Ambulatory Visit (INDEPENDENT_AMBULATORY_CARE_PROVIDER_SITE_OTHER): Payer: BLUE CROSS/BLUE SHIELD | Admitting: Physician Assistant

## 2016-03-27 VITALS — BP 143/88 | HR 61

## 2016-03-27 DIAGNOSIS — E291 Testicular hypofunction: Secondary | ICD-10-CM

## 2016-03-27 MED ORDER — TESTOSTERONE CYPIONATE 200 MG/ML IM SOLN
250.0000 mg | Freq: Once | INTRAMUSCULAR | Status: AC
Start: 2016-03-27 — End: 2016-03-27
  Administered 2016-03-27: 250 mg via INTRAMUSCULAR

## 2016-03-27 NOTE — Progress Notes (Signed)
Patient came into office today for testosterone injection. Denies chest pain, shortness of breath, headaches and problems associated with taking this medication. Patient BP was elevated, spoke with PCP and Pt advised to keep his upcoming appt with her for medication management. Patient states he has had no abnornal mood swings. Patient tolerated injection in [place] well without complications. Patient advised to schedule his next injection for 2 weeks from today.

## 2016-04-01 ENCOUNTER — Telehealth: Payer: Self-pay | Admitting: Physician Assistant

## 2016-04-01 ENCOUNTER — Encounter: Payer: Self-pay | Admitting: Physician Assistant

## 2016-04-01 ENCOUNTER — Ambulatory Visit (INDEPENDENT_AMBULATORY_CARE_PROVIDER_SITE_OTHER): Payer: BLUE CROSS/BLUE SHIELD | Admitting: Physician Assistant

## 2016-04-01 VITALS — BP 130/85 | HR 69 | Ht 71.0 in | Wt 285.0 lb

## 2016-04-01 DIAGNOSIS — E119 Type 2 diabetes mellitus without complications: Secondary | ICD-10-CM

## 2016-04-01 DIAGNOSIS — F329 Major depressive disorder, single episode, unspecified: Secondary | ICD-10-CM

## 2016-04-01 DIAGNOSIS — I10 Essential (primary) hypertension: Secondary | ICD-10-CM

## 2016-04-01 DIAGNOSIS — R809 Proteinuria, unspecified: Secondary | ICD-10-CM | POA: Diagnosis not present

## 2016-04-01 DIAGNOSIS — F32A Depression, unspecified: Secondary | ICD-10-CM

## 2016-04-01 LAB — POCT UA - MICROALBUMIN
CREATININE, POC: 200 mg/dL
MICROALBUMIN (UR) POC: 80 mg/L

## 2016-04-01 LAB — POCT GLYCOSYLATED HEMOGLOBIN (HGB A1C): Hemoglobin A1C: 6.4

## 2016-04-01 MED ORDER — AMLODIPINE BESYLATE 10 MG PO TABS: 10.0000 mg | ORAL_TABLET | Freq: Every day | ORAL | Status: DC

## 2016-04-01 NOTE — Telephone Encounter (Signed)
Pt was wondering if there has been any development on knee injections from insurance company?

## 2016-04-01 NOTE — Telephone Encounter (Signed)
Euflexxa is the preferred agent, rx sent to his specialty mail order pharmacy [facility] on 01/20/16, they should be mailing it to HIM per notes on rx.  Forwarding to [facility] to see if she has any ideas.

## 2016-04-02 NOTE — Telephone Encounter (Signed)
Alberteen Spindle!

## 2016-04-02 NOTE — Telephone Encounter (Signed)
Correction: [place]*

## 2016-04-02 NOTE — Telephone Encounter (Signed)
[facility]. Reactivated Rx for Euflexxa. Apparently they tried to contact Pt for payment information and to get verbal to mail to our office and were unsuccessful. New Rx called in to Pharmacist, Izora Gala B. They will contact Pt and then Rx can be mailed to our office.

## 2016-04-03 ENCOUNTER — Ambulatory Visit: Payer: BLUE CROSS/BLUE SHIELD | Admitting: Physician Assistant

## 2016-04-04 MED ORDER — FLUOXETINE HCL 20 MG PO TABS: ORAL_TABLET | ORAL | Status: DC

## 2016-04-04 NOTE — Progress Notes (Signed)
   Subjective:    Patient ID: Brandon Mckee, male    DOB: 12-19-1966, 50 y.o.   MRN: [id number]  HPI Pt presents to the clinic for 3 month follow up.   DM- not checking sugars. Denies any hypoglycemic feelings or events. Taking only xigduo. No open sores or wounds. He has cut out sugar in diet. He no longer eats a bowl of ice cream with cookies at night.   HTN- when checking at home in the 120's. No CP, palptiations, dizziness.   Depression- he is feeling much better about life being on prozac. He is happier. He still has bad days while taking care of his wife but overall much better. He is not as angry and irritable all the time either.    Review of Systems  All other systems reviewed and are negative.      Objective:   Physical Exam  Constitutional: He is oriented to person, place, and time. He appears well-developed and well-nourished.  Obese.   HENT:  Head: Normocephalic and atraumatic.  Cardiovascular: Normal rate, regular rhythm and normal heart sounds.   Pulmonary/Chest: Effort normal and breath sounds normal. He has no wheezes.  Neurological: He is alert and oriented to person, place, and time.  Psychiatric: He has a normal mood and affect. His behavior is normal.          Assessment & Plan:  DM- .[name] Lab Results  Component Value Date   HGBA1C 6.4 04/01/2016   Doing well.  Continue to make diet changes.  Positive for protein in urine.on ACE.  On STATIN.  Continue on xigduo.  Follow up in 3 months.   Depression- PHQ-9 was 4. Much better. prozac refilled for 6 months.   Hypertension- rechecked before leaving and was 130/85. Continue on medications.

## 2016-04-06 ENCOUNTER — Other Ambulatory Visit: Payer: Self-pay | Admitting: Physician Assistant

## 2016-04-10 ENCOUNTER — Ambulatory Visit (INDEPENDENT_AMBULATORY_CARE_PROVIDER_SITE_OTHER): Payer: BLUE CROSS/BLUE SHIELD | Admitting: Physician Assistant

## 2016-04-10 DIAGNOSIS — E291 Testicular hypofunction: Secondary | ICD-10-CM | POA: Diagnosis not present

## 2016-04-10 MED ORDER — TESTOSTERONE CYPIONATE 200 MG/ML IM SOLN
250.0000 mg | Freq: Once | INTRAMUSCULAR | Status: AC
  Administered 2016-04-10: 250 mg via INTRAMUSCULAR

## 2016-04-10 NOTE — Progress Notes (Signed)
Pt here for testosterone injection no SOB,CP or mood swings. Injection tolerated well given in LUOQ. Pt to RTC in 2 wks for next injection.Audelia Hives [place]

## 2016-04-24 ENCOUNTER — Encounter: Payer: Self-pay | Admitting: Physician Assistant

## 2016-04-24 ENCOUNTER — Ambulatory Visit (INDEPENDENT_AMBULATORY_CARE_PROVIDER_SITE_OTHER): Payer: BLUE CROSS/BLUE SHIELD | Admitting: Physician Assistant

## 2016-04-24 VITALS — BP 139/82 | HR 69 | Wt 280.0 lb

## 2016-04-24 DIAGNOSIS — E291 Testicular hypofunction: Secondary | ICD-10-CM | POA: Diagnosis not present

## 2016-04-24 MED ORDER — TESTOSTERONE CYPIONATE 200 MG/ML IM SOLN
250.0000 mg | Freq: Once | INTRAMUSCULAR | Status: AC
  Administered 2016-04-24: 250 mg via INTRAMUSCULAR

## 2016-04-24 NOTE — Progress Notes (Signed)
Patient came into office today for testosterone injection. Denies chest pain, shortness of breath, headaches and problems associated with taking this medication. Patient states he has had no abnornal mood swings. Patient tolerated injection in [place] well without complications. Patient advised to schedule his next injection for 2 weeks from today. 

## 2016-05-08 ENCOUNTER — Encounter: Payer: Self-pay | Admitting: Physician Assistant

## 2016-05-08 ENCOUNTER — Ambulatory Visit (INDEPENDENT_AMBULATORY_CARE_PROVIDER_SITE_OTHER): Payer: BLUE CROSS/BLUE SHIELD | Admitting: Physician Assistant

## 2016-05-08 VITALS — BP 141/83 | HR 84 | Wt 280.0 lb

## 2016-05-08 DIAGNOSIS — E291 Testicular hypofunction: Secondary | ICD-10-CM

## 2016-05-08 MED ORDER — TESTOSTERONE CYPIONATE 200 MG/ML IM SOLN
250.0000 mg | Freq: Once | INTRAMUSCULAR | Status: AC
  Administered 2016-05-08: 250 mg via INTRAMUSCULAR

## 2016-05-08 NOTE — Progress Notes (Signed)
   Subjective:    Patient ID: Brandon Mckee, male    DOB: 1966/07/23, 50 y.o.   MRN: [id number]  HPI  Presents for testosterone injection. Patient denies headaches,blurred vision,mood changes.    Review of Systems     Objective:   Physical Exam        Assessment & Plan:  Administered injection without complication. Advised to return in 2 weeks for next injection

## 2016-05-12 ENCOUNTER — Other Ambulatory Visit: Payer: Self-pay | Admitting: Physician Assistant

## 2016-05-20 ENCOUNTER — Encounter: Payer: Self-pay | Admitting: Physician Assistant

## 2016-05-21 ENCOUNTER — Encounter: Payer: Self-pay | Admitting: Physician Assistant

## 2016-05-22 ENCOUNTER — Ambulatory Visit: Payer: BLUE CROSS/BLUE SHIELD

## 2016-05-22 ENCOUNTER — Ambulatory Visit (INDEPENDENT_AMBULATORY_CARE_PROVIDER_SITE_OTHER): Payer: BLUE CROSS/BLUE SHIELD | Admitting: Physician Assistant

## 2016-05-22 ENCOUNTER — Encounter: Payer: Self-pay | Admitting: Physician Assistant

## 2016-05-22 ENCOUNTER — Ambulatory Visit (INDEPENDENT_AMBULATORY_CARE_PROVIDER_SITE_OTHER): Payer: BLUE CROSS/BLUE SHIELD

## 2016-05-22 VITALS — BP 148/84 | HR 77 | Ht 71.0 in | Wt 280.0 lb

## 2016-05-22 DIAGNOSIS — E291 Testicular hypofunction: Secondary | ICD-10-CM

## 2016-05-22 DIAGNOSIS — E559 Vitamin D deficiency, unspecified: Secondary | ICD-10-CM | POA: Diagnosis not present

## 2016-05-22 DIAGNOSIS — M79609 Pain in unspecified limb: Secondary | ICD-10-CM | POA: Diagnosis not present

## 2016-05-22 DIAGNOSIS — M7732 Calcaneal spur, left foot: Secondary | ICD-10-CM

## 2016-05-22 DIAGNOSIS — K2100 Gastro-esophageal reflux disease with esophagitis, without bleeding: Secondary | ICD-10-CM

## 2016-05-22 DIAGNOSIS — I10 Essential (primary) hypertension: Secondary | ICD-10-CM

## 2016-05-22 DIAGNOSIS — M79672 Pain in left foot: Secondary | ICD-10-CM

## 2016-05-22 DIAGNOSIS — F329 Major depressive disorder, single episode, unspecified: Secondary | ICD-10-CM

## 2016-05-22 DIAGNOSIS — F32A Depression, unspecified: Secondary | ICD-10-CM

## 2016-05-22 DIAGNOSIS — E119 Type 2 diabetes mellitus without complications: Secondary | ICD-10-CM | POA: Diagnosis not present

## 2016-05-22 LAB — URIC ACID: Uric Acid, Serum: 7.6 mg/dL (ref 4.0–7.8)

## 2016-05-22 MED ORDER — FLUOXETINE HCL 40 MG PO CAPS: 40.0000 mg | ORAL_CAPSULE | Freq: Every day | ORAL | Status: DC

## 2016-05-22 MED ORDER — TESTOSTERONE CYPIONATE 200 MG/ML IM SOLN
250.0000 mg | Freq: Once | INTRAMUSCULAR | Status: AC
  Administered 2016-05-22: 250 mg via INTRAMUSCULAR

## 2016-05-22 MED ORDER — ESOMEPRAZOLE MAGNESIUM 40 MG PO CPDR: 40.0000 mg | DELAYED_RELEASE_CAPSULE | Freq: Every day | ORAL | Status: DC

## 2016-05-22 MED ORDER — RANITIDINE HCL 300 MG PO TABS: 300.0000 mg | ORAL_TABLET | Freq: Two times a day (BID) | ORAL | Status: DC

## 2016-05-22 NOTE — Patient Instructions (Addendum)
Increased prozac to 40mg  daily.  Changed omeprazole to nexium 40mg  daily.  Increased zantac to 300mg  twice a day.  USE CPAP.  GEt XRAY of foot.  mobic for 2 weeks and ice foot unless told otherwise.

## 2016-05-22 NOTE — Progress Notes (Signed)
Subjective:    Patient ID: Brandon Mckee, male    DOB: [birth year], 50 y.o.   MRN: [id number]  HPI  Patient is a 50 year old male who presents to the clinic for multiple concerns and testosterone injection.  Male hypogonadism erythematous testosterone shot today. He does feel a lot of improvement since starting these injection in his energy.  His daughter and wife are concerned about him falling asleep at the will. He does admit that this has happened a few times over the past few months. He admits he is not getting great rest at night. He has no trouble going to sleep but he wakes up frequently. He is not using his CPAP machine as he has not moved it to the guest bedroom where he sleeps now.  Patient does have diabetes type 2 which was controlled at last check approximately one month ago. He continues to take his medications daily. He has not checking his sugars regularly. He denies any hypoglycemic symptoms and/or open sores or wounds.  Hypertension he is taking his medications daily. He does not check them at home. He is currently in some pain with his left foot and thinks is why his blood pressure is elevated today.  Vitamin D deficiency-he reports he has taken his vitamin D and needs recheck today.  GERD-patient does feel like his acid reflux is not controlled at this time. If he needs anything past 7:00 he almost has to sit up in order to sleep. He is taking omeprazole 40 mg daily and Zantac 150 mg at bedtime currently. He is not made changes to his diet to help with acid reflux.  Depression-his wife is concerned about his mood. He does take Prozac 20 mg daily and has helped his mood. He denies any suicidal or homicidal thoughts. He does admit that he feels like he does nothing for himself. It does make an side that there is no sexual intimacy between him and his wife. He never bought he would go this long without sexual intercourse. It has been 5 years.  He occasionally will  masturbate but he feels like it is not the same. He does admit he is more testy between him and his wife.   He also complains of left foot pain for the last 2 days. He denies any known injury. When he got out of the hot water of his shower seemed to make worse. Wearing tennis use tends to make a little bit better. He denies any excessive red meat, shrimp, alcohol. He has not had gout.    Review of Systems  All other systems reviewed and are negative.      Objective:   Physical Exam  Constitutional: He is oriented to person, place, and time. He appears well-developed and well-nourished.  Obese.   HENT:  Head: Normocephalic and atraumatic.  Cardiovascular: Normal rate, regular rhythm and normal heart sounds.   Pulmonary/Chest: Effort normal and breath sounds normal. He has no wheezes.  Musculoskeletal:  Normal ROM at left ankle.  Tenderness over dorsal foot and into medial malleous.  No bruising or swelling.  Slightly warm over dorsal foot.   Neurological: He is alert and oriented to person, place, and time.  Skin: Skin is dry.  Psychiatric: He has a normal mood and affect. His behavior is normal.          Assessment & Plan:  Male hypogonadism- testosterone injection given today. Follow up in 2 weeks. Recheck testosterone level.   DM- not time  for a1c. Continue medications. Encouraged to work on weight loss.   HTN- elevated today. Could be due to pain. Continue on medications. Follow up in 2 weeks for BP check.   Vitamin D def- recheck today. Per pt on vitamin D 2000 units daily.   GERD- stop omeprazole. Start nexium 40mg  daily and zantac 300mg  bid. Discussed GERD diet. Weight loss could help with this.   Depression- increased prozac to 40mg  daily. Follow up in 2 months.   Left foot pain- unclear etiology. Could be some gout but not in joint. Will check uric acid. Xray done of left foot to rule out stress fracture. Mobic, ice, elevation for next 3-4 days. Call Monday if  not improved. Wear good supportive shoe.

## 2016-05-23 LAB — VITAMIN D 25 HYDROXY (VIT D DEFICIENCY, FRACTURES): Vit D, 25-Hydroxy: 18 ng/mL — ABNORMAL LOW (ref 30–100)

## 2016-05-25 LAB — TESTOSTERONE TOTAL,FREE,BIO, MALES
Albumin: 4.3 g/dL (ref 3.6–5.1)
SEX HORMONE BINDING: 21 nmol/L (ref 10–50)
TESTOSTERONE BIOAVAILABLE: 106.2 ng/dL — AB (ref 130.5–681.7)
TESTOSTERONE FREE: 53.9 pg/mL (ref 47.0–244.0)
TESTOSTERONE: 297 ng/dL (ref 250–827)

## 2016-05-27 MED ORDER — COLCHICINE 0.6 MG PO CAPS: ORAL_CAPSULE | ORAL | Status: DC

## 2016-05-27 MED ORDER — VITAMIN D (ERGOCALCIFEROL) 1.25 MG (50000 UNIT) PO CAPS: 50000.0000 [IU] | ORAL_CAPSULE | ORAL | Status: DC

## 2016-05-27 MED ORDER — ALLOPURINOL 100 MG PO TABS: 100.0000 mg | ORAL_TABLET | Freq: Every day | ORAL | Status: DC

## 2016-05-27 NOTE — Addendum Note (Signed)
Addended by: Donella Stade on: 05/27/2016 03:54 PM   Modules accepted: Orders

## 2016-06-05 ENCOUNTER — Ambulatory Visit (INDEPENDENT_AMBULATORY_CARE_PROVIDER_SITE_OTHER): Payer: BLUE CROSS/BLUE SHIELD | Admitting: Physician Assistant

## 2016-06-05 VITALS — BP 137/69 | HR 68 | Wt 277.0 lb

## 2016-06-05 DIAGNOSIS — E291 Testicular hypofunction: Secondary | ICD-10-CM

## 2016-06-05 MED ORDER — TESTOSTERONE CYPIONATE 200 MG/ML IM SOLN
300.0000 mg | Freq: Once | INTRAMUSCULAR | Status: AC
  Administered 2016-06-05: 300 mg via INTRAMUSCULAR

## 2016-06-05 NOTE — Progress Notes (Signed)
Patient came into office today for testosterone injection. Denies chest pain, shortness of breath, headaches and problems associated with taking this medication. Patient states he has had no abnornal mood swings. Patient tolerated injection in LUOQ well without complications. Patient advised to schedule his next injection for 2 weeks from today. 

## 2016-06-19 ENCOUNTER — Ambulatory Visit (INDEPENDENT_AMBULATORY_CARE_PROVIDER_SITE_OTHER): Payer: BLUE CROSS/BLUE SHIELD | Admitting: Physician Assistant

## 2016-06-19 VITALS — BP 128/79 | HR 72 | Wt 272.1 lb

## 2016-06-19 DIAGNOSIS — E291 Testicular hypofunction: Secondary | ICD-10-CM

## 2016-06-19 MED ORDER — TESTOSTERONE CYPIONATE 200 MG/ML IM SOLN
300.0000 mg | Freq: Once | INTRAMUSCULAR | Status: AC
  Administered 2016-06-19: 300 mg via INTRAMUSCULAR

## 2016-06-19 NOTE — Progress Notes (Signed)
   Subjective:    Patient ID: Brandon Mckee, male    DOB: 1966-12-11, 50 y.o.   MRN: [id number]  HPI Pt is here for testosterone injection.  Denies CP, SOB, HA, mood changes, and problems with medications.     Review of Systems     Objective:   Physical Exam        Assessment & Plan:  Pt tolerated injection well and without complications.  Given in Brandon Mckee.  Pt instructed to schedule a return visit in 14 days for next injection.

## 2016-06-22 ENCOUNTER — Other Ambulatory Visit: Payer: Self-pay | Admitting: Physician Assistant

## 2016-07-03 ENCOUNTER — Ambulatory Visit (INDEPENDENT_AMBULATORY_CARE_PROVIDER_SITE_OTHER): Payer: BLUE CROSS/BLUE SHIELD | Admitting: Osteopathic Medicine

## 2016-07-03 VITALS — BP 128/83 | HR 73

## 2016-07-03 DIAGNOSIS — E291 Testicular hypofunction: Secondary | ICD-10-CM | POA: Diagnosis not present

## 2016-07-03 MED ORDER — TESTOSTERONE CYPIONATE 200 MG/ML IM SOLN: 300.0000 mg | Freq: Once | INTRAMUSCULAR | Status: DC

## 2016-07-03 NOTE — Progress Notes (Signed)
Nurse notes reviewed - no concerns  Filed Vitals:   07/03/16 0907  BP: 128/83  Pulse: 73    A/P Hypogonadism in male - Return as directed for routine testosterone injections - Plan: testosterone cypionate (DEPOTESTOSTERONE CYPIONATE) injection 300 mg

## 2016-07-03 NOTE — Progress Notes (Signed)
   Subjective:    Patient ID: Brandon Mckee, male    DOB: Jun 05, 1966, 50 y.o.   MRN: [id number]  HPI  Brandon Mckee presents to the clinic for a testosterone injection. Denies SOB, chest pain, headache, and mood changes.   Review of Systems     Objective:   Physical Exam        Assessment & Plan:  Pt tolerated injection in LUOQ well without complications.  Pt advise to make next appointment in 14 days. -[facility]/RMA

## 2016-07-17 ENCOUNTER — Ambulatory Visit (INDEPENDENT_AMBULATORY_CARE_PROVIDER_SITE_OTHER): Payer: BLUE CROSS/BLUE SHIELD | Admitting: Physician Assistant

## 2016-07-17 VITALS — BP 125/72 | HR 82 | Wt 276.0 lb

## 2016-07-17 DIAGNOSIS — E291 Testicular hypofunction: Secondary | ICD-10-CM

## 2016-07-17 MED ORDER — TESTOSTERONE CYPIONATE 200 MG/ML IM SOLN
300.0000 mg | Freq: Once | INTRAMUSCULAR | Status: AC
  Administered 2016-07-17: 300 mg via INTRAMUSCULAR

## 2016-07-17 NOTE — Progress Notes (Signed)
   Subjective:    Patient ID: Brandon Mckee, male    DOB: 1966-02-12, 50 y.o.   MRN: [id number]  HPI  Brandon Mckee is here for a testosterone injection. Denies chest pain, shortness of breath, headaches or mood changes.    Review of Systems     Objective:   Physical Exam        Assessment & Plan:  Patient tolerated injection well without complications. Patient advised to schedule next injection 14 days from today.

## 2016-07-23 ENCOUNTER — Encounter: Payer: Self-pay | Admitting: Physician Assistant

## 2016-07-24 ENCOUNTER — Encounter: Payer: Self-pay | Admitting: Physician Assistant

## 2016-07-24 ENCOUNTER — Ambulatory Visit (INDEPENDENT_AMBULATORY_CARE_PROVIDER_SITE_OTHER): Payer: BLUE CROSS/BLUE SHIELD | Admitting: Physician Assistant

## 2016-07-24 VITALS — BP 126/67 | HR 74 | Ht 71.0 in | Wt 277.0 lb

## 2016-07-24 DIAGNOSIS — E119 Type 2 diabetes mellitus without complications: Secondary | ICD-10-CM

## 2016-07-24 DIAGNOSIS — E559 Vitamin D deficiency, unspecified: Secondary | ICD-10-CM | POA: Diagnosis not present

## 2016-07-24 DIAGNOSIS — G4733 Obstructive sleep apnea (adult) (pediatric): Secondary | ICD-10-CM

## 2016-07-24 DIAGNOSIS — E785 Hyperlipidemia, unspecified: Secondary | ICD-10-CM | POA: Diagnosis not present

## 2016-07-24 DIAGNOSIS — E291 Testicular hypofunction: Secondary | ICD-10-CM

## 2016-07-24 DIAGNOSIS — I10 Essential (primary) hypertension: Secondary | ICD-10-CM

## 2016-07-24 DIAGNOSIS — F32A Depression, unspecified: Secondary | ICD-10-CM

## 2016-07-24 DIAGNOSIS — E8881 Metabolic syndrome: Secondary | ICD-10-CM

## 2016-07-24 DIAGNOSIS — F329 Major depressive disorder, single episode, unspecified: Secondary | ICD-10-CM

## 2016-07-24 LAB — POCT GLYCOSYLATED HEMOGLOBIN (HGB A1C): Hemoglobin A1C: 6.3

## 2016-07-24 MED ORDER — DAPAGLIFLOZIN PRO-METFORMIN ER 10-1000 MG PO TB24: 1.0000 | ORAL_TABLET | Freq: Every day | ORAL | 5 refills | Status: DC

## 2016-07-24 MED ORDER — FLUOXETINE HCL 40 MG PO CAPS: 40.0000 mg | ORAL_CAPSULE | Freq: Every day | ORAL | 5 refills | Status: DC

## 2016-07-24 MED ORDER — GABAPENTIN 100 MG PO CAPS: 100.0000 mg | ORAL_CAPSULE | Freq: Every day | ORAL | 5 refills | Status: DC

## 2016-07-24 MED ORDER — TAMSULOSIN HCL 0.4 MG PO CAPS: 0.4000 mg | ORAL_CAPSULE | Freq: Every day | ORAL | 5 refills | Status: DC

## 2016-07-24 MED ORDER — CARVEDILOL 25 MG PO TABS: 25.0000 mg | ORAL_TABLET | Freq: Two times a day (BID) | ORAL | 5 refills | Status: DC

## 2016-07-24 MED ORDER — LISINOPRIL-HYDROCHLOROTHIAZIDE 20-25 MG PO TABS: 1.0000 | ORAL_TABLET | Freq: Every day | ORAL | 5 refills | Status: DC

## 2016-07-24 MED ORDER — AMLODIPINE BESYLATE 10 MG PO TABS
10.0000 mg | ORAL_TABLET | Freq: Every day | ORAL | 5 refills | Status: DC
Start: 2016-07-24 — End: 2016-10-23

## 2016-07-24 MED ORDER — ESOMEPRAZOLE MAGNESIUM 40 MG PO CPDR
40.0000 mg | DELAYED_RELEASE_CAPSULE | Freq: Every day | ORAL | 5 refills | Status: DC
Start: 2016-07-24 — End: 2016-10-23

## 2016-07-24 NOTE — Patient Instructions (Signed)
Get labs done in next month.

## 2016-07-24 NOTE — Progress Notes (Addendum)
   Subjective:    Patient ID: Brandon Mckee, male    DOB: 1966-01-20, 50 y.o.   MRN: [id number]  HPI Patient is a 50 year old male who presents to the clinic for follow-up and medication refills. He has no concerns. His wife is worried about him being so sleepy. Pt does state he gets sleeply after eating. Has has not checked his sugars. He is not wearing his CPAP. He feels like his mood is good. He is very happy and doing more things for himself. He does mention he wakes up a lot at night to urinate. He is on a diruretic but takes in am.      Review of Systems  All other systems reviewed and are negative.      Objective:   Physical Exam  Constitutional: He is oriented to person, place, and time. He appears well-developed and well-nourished.  HENT:  Head: Normocephalic and atraumatic.  Cardiovascular: Normal rate, regular rhythm and normal heart sounds.   Pulmonary/Chest: Effort normal and breath sounds normal.  Neurological: He is alert and oriented to person, place, and time.  Psychiatric: He has a normal mood and affect. His behavior is normal.          Assessment & Plan:  Diabetes Mellitus, type II- .[name] Lab Results  Component Value Date   HGBA1C 6.3 07/24/2016   Fatigue- seems to be after breakfast. Check sugar to make sure not dropping. Start wearing CPAP. Discussed weight loss.   Depression- doing great on prozac increase.refilled for 6 months.   HTN- doing great. Controlled refill for next 6 months.   Obstructive sleep apnea- encouraged to start wearing CPAP nightly.   Hyperlipidemia- needs rechecked. Labs printed.   Vitamin D def- needs rechecked labs printed.   BPH- AUA 11. Started flomax. Declined DRE today.

## 2016-07-31 ENCOUNTER — Ambulatory Visit (INDEPENDENT_AMBULATORY_CARE_PROVIDER_SITE_OTHER): Payer: BLUE CROSS/BLUE SHIELD | Admitting: Family Medicine

## 2016-07-31 ENCOUNTER — Telehealth: Payer: Self-pay | Admitting: *Deleted

## 2016-07-31 VITALS — BP 116/74 | HR 74

## 2016-07-31 DIAGNOSIS — E291 Testicular hypofunction: Secondary | ICD-10-CM

## 2016-07-31 MED ORDER — TESTOSTERONE CYPIONATE 200 MG/ML IM SOLN: 200.0000 mg | Freq: Once | INTRAMUSCULAR | Status: DC

## 2016-07-31 MED ORDER — TESTOSTERONE CYPIONATE 200 MG/ML IM SOLN
300.0000 mg | Freq: Once | INTRAMUSCULAR | Status: AC
  Administered 2016-07-31: 300 mg via INTRAMUSCULAR

## 2016-07-31 NOTE — Progress Notes (Signed)
Testosterone given in [place] without complications.

## 2016-07-31 NOTE — Progress Notes (Signed)
Patient will need repeat testosterone in one week from today to recheck level since dose was increased 2 mo ago.   Beatrice Lecher, MD

## 2016-07-31 NOTE — Progress Notes (Signed)
Pt notified to get labs drawn in one week.

## 2016-07-31 NOTE — Telephone Encounter (Signed)
Testosterone lab ordered.  

## 2016-08-07 DIAGNOSIS — E291 Testicular hypofunction: Secondary | ICD-10-CM | POA: Diagnosis not present

## 2016-08-08 LAB — TESTOSTERONE: Testosterone: 725 ng/dL (ref 250–827)

## 2016-08-14 ENCOUNTER — Ambulatory Visit (INDEPENDENT_AMBULATORY_CARE_PROVIDER_SITE_OTHER): Payer: BLUE CROSS/BLUE SHIELD | Admitting: Physician Assistant

## 2016-08-14 VITALS — BP 136/77 | HR 70 | Temp 97.8°F | Resp 18 | Ht 71.0 in | Wt 285.0 lb

## 2016-08-14 DIAGNOSIS — E291 Testicular hypofunction: Secondary | ICD-10-CM | POA: Diagnosis not present

## 2016-08-14 MED ORDER — TESTOSTERONE CYPIONATE 200 MG/ML IM SOLN
300.0000 mg | INTRAMUSCULAR | Status: DC
  Administered 2016-08-14 – 2016-08-28 (×2): 300 mg via INTRAMUSCULAR

## 2016-08-14 NOTE — Progress Notes (Signed)
Patient here today for testosterone injection. Patient denies chest pain, shortness of breath, headaches and mood changes. Patient tolerated injection well without complications. Advised him to return for next injection in 14 days.

## 2016-08-15 ENCOUNTER — Other Ambulatory Visit: Payer: Self-pay | Admitting: Physician Assistant

## 2016-08-15 DIAGNOSIS — M170 Bilateral primary osteoarthritis of knee: Secondary | ICD-10-CM

## 2016-08-16 ENCOUNTER — Other Ambulatory Visit: Payer: Self-pay | Admitting: Physician Assistant

## 2016-08-28 ENCOUNTER — Ambulatory Visit (INDEPENDENT_AMBULATORY_CARE_PROVIDER_SITE_OTHER): Payer: BLUE CROSS/BLUE SHIELD | Admitting: Physician Assistant

## 2016-08-28 VITALS — BP 117/75 | HR 64 | Wt 274.0 lb

## 2016-08-28 DIAGNOSIS — E291 Testicular hypofunction: Secondary | ICD-10-CM | POA: Diagnosis not present

## 2016-08-28 MED ORDER — TESTOSTERONE CYPIONATE 200 MG/ML IM SOLN: 300.0000 mg | Freq: Once | INTRAMUSCULAR | Status: DC

## 2016-08-28 NOTE — Progress Notes (Signed)
   Subjective:    Patient ID: Brandon Mckee, male    DOB: 01-11-1966, 50 y.o.   MRN: [id number]  HPI  Na[place] Dollarhide Nethery is here for a testosterone injection. Denies chest pain, shortness of breath, headaches or mood changes.    Review of Systems     Objective:   Physical Exam        Assessment & Plan:  Patient tolerated injection well without complications. Patient advised to schedule next injection 14 days from today.

## 2016-09-01 ENCOUNTER — Other Ambulatory Visit: Payer: Self-pay | Admitting: *Deleted

## 2016-09-01 MED ORDER — NIACIN ER (ANTIHYPERLIPIDEMIC) 500 MG PO TBCR: 500.0000 mg | EXTENDED_RELEASE_TABLET | Freq: Every day | ORAL | 5 refills | Status: DC

## 2016-09-01 MED ORDER — COLCHICINE 0.6 MG PO CAPS: ORAL_CAPSULE | ORAL | 2 refills | Status: DC

## 2016-09-01 MED ORDER — FUROSEMIDE 20 MG PO TABS: ORAL_TABLET | ORAL | 3 refills | Status: DC

## 2016-09-05 ENCOUNTER — Other Ambulatory Visit: Payer: Self-pay | Admitting: Physician Assistant

## 2016-09-11 ENCOUNTER — Ambulatory Visit (INDEPENDENT_AMBULATORY_CARE_PROVIDER_SITE_OTHER): Payer: BLUE CROSS/BLUE SHIELD | Admitting: Physician Assistant

## 2016-09-11 VITALS — BP 159/79 | HR 78 | Wt 285.0 lb

## 2016-09-11 DIAGNOSIS — Z23 Encounter for immunization: Secondary | ICD-10-CM | POA: Diagnosis not present

## 2016-09-11 DIAGNOSIS — E291 Testicular hypofunction: Secondary | ICD-10-CM

## 2016-09-11 MED ORDER — TESTOSTERONE CYPIONATE 200 MG/ML IM SOLN
300.0000 mg | Freq: Once | INTRAMUSCULAR | Status: AC
  Administered 2016-09-11: 300 mg via INTRAMUSCULAR

## 2016-09-11 NOTE — Progress Notes (Signed)
   Subjective:    Patient ID: Brandon Mckee, male    DOB: 09-05-1966, 50 y.o.   MRN: [id number]  HPI  Brandon Mckee is here for a testosterone injection and flu vaccine. Denies chest pain, shortness of breath, headaches or mood changes.    Review of Systems     Objective:   Physical Exam        Assessment & Plan:  Patient tolerated injections well without complications. Patient advised to schedule next injection 14 days from today.

## 2016-09-21 ENCOUNTER — Other Ambulatory Visit: Payer: Self-pay | Admitting: Family Medicine

## 2016-09-21 ENCOUNTER — Other Ambulatory Visit: Payer: Self-pay | Admitting: Physician Assistant

## 2016-09-21 DIAGNOSIS — R609 Edema, unspecified: Secondary | ICD-10-CM

## 2016-09-25 ENCOUNTER — Encounter: Payer: Self-pay | Admitting: Physician Assistant

## 2016-09-25 ENCOUNTER — Ambulatory Visit (INDEPENDENT_AMBULATORY_CARE_PROVIDER_SITE_OTHER): Payer: BLUE CROSS/BLUE SHIELD | Admitting: Physician Assistant

## 2016-09-25 VITALS — BP 153/92 | HR 73

## 2016-09-25 DIAGNOSIS — E291 Testicular hypofunction: Secondary | ICD-10-CM

## 2016-09-25 MED ORDER — TESTOSTERONE CYPIONATE 200 MG/ML IM SOLN
200.0000 mg | Freq: Once | INTRAMUSCULAR | Status: AC
  Administered 2016-09-25: 200 mg via INTRAMUSCULAR

## 2016-09-25 NOTE — Progress Notes (Signed)
Brandon Mckee presents to the clinic for a testosterone injection.  Denies SOB, chest pain, headache and mood swings. Pt tolerated injection well in the LUOQ without any complications.  Pt advised to make next appointment in 14 days. -[facility]/RMA

## 2016-10-09 ENCOUNTER — Ambulatory Visit (INDEPENDENT_AMBULATORY_CARE_PROVIDER_SITE_OTHER): Payer: BLUE CROSS/BLUE SHIELD | Admitting: Sports Medicine

## 2016-10-09 VITALS — BP 125/69 | HR 69

## 2016-10-09 DIAGNOSIS — E291 Testicular hypofunction: Secondary | ICD-10-CM | POA: Diagnosis not present

## 2016-10-09 MED ORDER — TESTOSTERONE CYPIONATE 200 MG/ML IM SOLN
300.0000 mg | Freq: Once | INTRAMUSCULAR | Status: AC
  Administered 2016-10-09: 300 mg via INTRAMUSCULAR

## 2016-10-09 NOTE — Progress Notes (Signed)
Testosterone given in RUOQ.  No complications.

## 2016-10-13 ENCOUNTER — Encounter: Payer: Self-pay | Admitting: Physician Assistant

## 2016-10-23 ENCOUNTER — Ambulatory Visit (INDEPENDENT_AMBULATORY_CARE_PROVIDER_SITE_OTHER): Payer: BLUE CROSS/BLUE SHIELD | Admitting: Physician Assistant

## 2016-10-23 ENCOUNTER — Encounter: Payer: Self-pay | Admitting: Physician Assistant

## 2016-10-23 VITALS — BP 116/63 | HR 83 | Ht 71.0 in | Wt 279.0 lb

## 2016-10-23 DIAGNOSIS — K2100 Gastro-esophageal reflux disease with esophagitis, without bleeding: Secondary | ICD-10-CM

## 2016-10-23 DIAGNOSIS — I10 Essential (primary) hypertension: Secondary | ICD-10-CM | POA: Diagnosis not present

## 2016-10-23 DIAGNOSIS — F3340 Major depressive disorder, recurrent, in remission, unspecified: Secondary | ICD-10-CM

## 2016-10-23 DIAGNOSIS — E8881 Metabolic syndrome: Secondary | ICD-10-CM | POA: Diagnosis not present

## 2016-10-23 DIAGNOSIS — E291 Testicular hypofunction: Secondary | ICD-10-CM

## 2016-10-23 DIAGNOSIS — N400 Enlarged prostate without lower urinary tract symptoms: Secondary | ICD-10-CM

## 2016-10-23 DIAGNOSIS — E119 Type 2 diabetes mellitus without complications: Secondary | ICD-10-CM | POA: Diagnosis not present

## 2016-10-23 DIAGNOSIS — E559 Vitamin D deficiency, unspecified: Secondary | ICD-10-CM

## 2016-10-23 LAB — POCT GLYCOSYLATED HEMOGLOBIN (HGB A1C): HEMOGLOBIN A1C: 6.2

## 2016-10-23 MED ORDER — LISINOPRIL-HYDROCHLOROTHIAZIDE 20-25 MG PO TABS: 1.0000 | ORAL_TABLET | Freq: Every day | ORAL | 5 refills | Status: DC

## 2016-10-23 MED ORDER — DAPAGLIFLOZIN PRO-METFORMIN ER 10-1000 MG PO TB24: 1.0000 | ORAL_TABLET | Freq: Every day | ORAL | 5 refills | Status: DC

## 2016-10-23 MED ORDER — TESTOSTERONE CYPIONATE 200 MG/ML IM SOLN
300.0000 mg | Freq: Once | INTRAMUSCULAR | Status: AC
  Administered 2016-10-23: 300 mg via INTRAMUSCULAR

## 2016-10-23 MED ORDER — ESOMEPRAZOLE MAGNESIUM 40 MG PO CPDR: 40.0000 mg | DELAYED_RELEASE_CAPSULE | Freq: Every day | ORAL | 5 refills | Status: DC

## 2016-10-23 MED ORDER — GABAPENTIN 100 MG PO CAPS: 100.0000 mg | ORAL_CAPSULE | Freq: Every day | ORAL | 5 refills | Status: DC

## 2016-10-23 MED ORDER — CARVEDILOL 25 MG PO TABS: 25.0000 mg | ORAL_TABLET | Freq: Two times a day (BID) | ORAL | 5 refills | Status: DC

## 2016-10-23 MED ORDER — ATORVASTATIN CALCIUM 40 MG PO TABS: 40.0000 mg | ORAL_TABLET | Freq: Every day | ORAL | 5 refills | Status: DC

## 2016-10-23 MED ORDER — FLUOXETINE HCL 40 MG PO CAPS
40.0000 mg | ORAL_CAPSULE | Freq: Every day | ORAL | 5 refills | Status: DC
Start: 2016-10-23 — End: 2017-04-23

## 2016-10-23 MED ORDER — TAMSULOSIN HCL 0.4 MG PO CAPS: 0.4000 mg | ORAL_CAPSULE | Freq: Every day | ORAL | 5 refills | Status: DC

## 2016-10-23 MED ORDER — ALLOPURINOL 100 MG PO TABS: 100.0000 mg | ORAL_TABLET | Freq: Every day | ORAL | 5 refills | Status: DC

## 2016-10-23 MED ORDER — RANITIDINE HCL 300 MG PO TABS: 300.0000 mg | ORAL_TABLET | Freq: Two times a day (BID) | ORAL | 1 refills | Status: DC

## 2016-10-23 MED ORDER — NIACIN ER (ANTIHYPERLIPIDEMIC) 500 MG PO TBCR: 500.0000 mg | EXTENDED_RELEASE_TABLET | Freq: Every day | ORAL | 5 refills | Status: DC

## 2016-10-23 MED ORDER — AMLODIPINE BESYLATE 10 MG PO TABS: 10.0000 mg | ORAL_TABLET | Freq: Every day | ORAL | 5 refills | Status: DC

## 2016-10-23 NOTE — Progress Notes (Signed)
   Subjective:    Patient ID: Brandon Mckee, male    DOB: April 13, 1966, 50 y.o.   MRN: [id number]  HPI  Pt is a 50 yo male who presents to the clinic for medication refill.   DM- not checking sugars. Pt has limited cookies and bowls of cereal at night. No hypoglycemia. No opens sores or wounds. Taking medications daily.   HTN- no CP, palpitations, headaches, dizziness.      Review of Systems  All other systems reviewed and are negative.      Objective:   Physical Exam  Constitutional: He is oriented to person, place, and time. He appears well-developed and well-nourished.  HENT:  Head: Normocephalic and atraumatic.  Cardiovascular: Normal rate, regular rhythm and normal heart sounds.   Pulmonary/Chest: Effort normal and breath sounds normal.  Neurological: He is alert and oriented to person, place, and time.  Psychiatric: He has a normal mood and affect. His behavior is normal.          Assessment & Plan:  [name] KitchenMarland KitchenKeonte was seen today for diabetes and hypogonadism.  Diagnoses and all orders for this visit:  Hypogonadism in male -     testosterone cypionate (DEPOTESTOSTERONE CYPIONATE) injection 300 mg; Inject 1.5 mLs (300 mg total) into the muscle once.  Controlled type 2 diabetes mellitus without complication, without long-term current use of insulin (HCC) -     POCT HgB A1C -     gabapentin (NEURONTIN) 100 MG capsule; Take 1 capsule (100 mg total) by mouth daily. -     Dapagliflozin-Metformin HCl ER (XIGDUO XR) 09-999 MG TB24; Take 1 tablet by mouth daily.  DYSMETABOLIC SYNDROME -     niacin (NIASPAN) 500 MG CR tablet; Take 1 tablet (500 mg total) by mouth at bedtime. -     Dapagliflozin-Metformin HCl ER (XIGDUO XR) 09-999 MG TB24; Take 1 tablet by mouth daily. -     atorvastatin (LIPITOR) 40 MG tablet; Take 1 tablet (40 mg total) by mouth daily. -     Lipid panel  Essential hypertension -     lisinopril-hydrochlorothiazide (PRINZIDE,ZESTORETIC) 20-25 MG  tablet; Take 1 tablet by mouth daily. -     carvedilol (COREG) 25 MG tablet; Take 1 tablet (25 mg total) by mouth 2 (two) times daily with a meal. -     amLODipine (NORVASC) 10 MG tablet; Take 1 tablet (10 mg total) by mouth daily.  Vitamin D deficiency -     VITAMIN D 25 Hydroxy (Vit-D Deficiency, Fractures)  Recurrent major depressive disorder, in remission (HCC) -     FLUoxetine (PROZAC) 40 MG capsule; Take 1 capsule (40 mg total) by mouth daily.  Gastroesophageal reflux disease with esophagitis -     ranitidine (ZANTAC) 300 MG tablet; Take 1 tablet (300 mg total) by mouth 2 (two) times daily.  Benign prostatic hyperplasia without lower urinary tract symptoms -     tamsulosin (FLOMAX) 0.4 MG CAPS capsule; Take 1 capsule (0.4 mg total) by mouth daily after supper.  Other orders -     esomeprazole (NEXIUM) 40 MG capsule; Take 1 capsule (40 mg total) by mouth daily. -     allopurinol (ZYLOPRIM) 100 MG tablet; Take 1 tablet (100 mg total) by mouth daily.   .. Lab Results  Component Value Date   HGBA1C 6.2 10/23/2016  6.2 great.  Stay on same medications.  Continue with diet changes.

## 2016-10-24 ENCOUNTER — Encounter: Payer: Self-pay | Admitting: Physician Assistant

## 2016-10-26 DIAGNOSIS — E559 Vitamin D deficiency, unspecified: Secondary | ICD-10-CM | POA: Diagnosis not present

## 2016-10-26 DIAGNOSIS — E8881 Metabolic syndrome: Secondary | ICD-10-CM | POA: Diagnosis not present

## 2016-10-27 ENCOUNTER — Other Ambulatory Visit: Payer: Self-pay | Admitting: Physician Assistant

## 2016-10-27 DIAGNOSIS — M170 Bilateral primary osteoarthritis of knee: Secondary | ICD-10-CM

## 2016-10-27 LAB — LIPID PANEL
CHOLESTEROL: 152 mg/dL (ref <200)
HDL: 21 mg/dL — ABNORMAL LOW (ref >40)
LDL Cholesterol: 87 mg/dL
Total CHOL/HDL Ratio: 7.2 Ratio — ABNORMAL HIGH (ref <5.0)
Triglycerides: 221 mg/dL — ABNORMAL HIGH (ref <150)
VLDL: 44 mg/dL — AB (ref <30)

## 2016-10-27 LAB — VITAMIN D 25 HYDROXY (VIT D DEFICIENCY, FRACTURES): VIT D 25 HYDROXY: 24 ng/mL — AB (ref 30–100)

## 2016-10-29 ENCOUNTER — Other Ambulatory Visit: Payer: Self-pay | Admitting: *Deleted

## 2016-10-29 DIAGNOSIS — R609 Edema, unspecified: Secondary | ICD-10-CM

## 2016-10-29 MED ORDER — FUROSEMIDE 20 MG PO TABS: ORAL_TABLET | ORAL | 3 refills | Status: DC

## 2016-11-04 ENCOUNTER — Other Ambulatory Visit: Payer: Self-pay | Admitting: Physician Assistant

## 2016-11-06 ENCOUNTER — Ambulatory Visit (INDEPENDENT_AMBULATORY_CARE_PROVIDER_SITE_OTHER): Payer: BLUE CROSS/BLUE SHIELD | Admitting: Physician Assistant

## 2016-11-06 VITALS — BP 134/82 | HR 86

## 2016-11-06 DIAGNOSIS — E291 Testicular hypofunction: Secondary | ICD-10-CM | POA: Diagnosis not present

## 2016-11-06 MED ORDER — TESTOSTERONE CYPIONATE 200 MG/ML IM SOLN
300.0000 mg | Freq: Once | INTRAMUSCULAR | Status: AC
  Administered 2016-11-06: 300 mg via INTRAMUSCULAR

## 2016-11-06 NOTE — Progress Notes (Signed)
Patient came into office today for testosterone injection. Denies chest pain, shortness of breath, headaches and problems associated with taking this medication. Patient states he has had no abnornal mood swings. Patient tolerated injection in RUOQ well without complications. Patient advised to schedule his next injection for 2 weeks from today. 

## 2016-11-19 ENCOUNTER — Encounter: Payer: Self-pay | Admitting: Sports Medicine

## 2016-11-20 ENCOUNTER — Encounter: Payer: Self-pay | Admitting: Physician Assistant

## 2016-11-20 ENCOUNTER — Ambulatory Visit (INDEPENDENT_AMBULATORY_CARE_PROVIDER_SITE_OTHER): Payer: BLUE CROSS/BLUE SHIELD | Admitting: Physician Assistant

## 2016-11-20 VITALS — BP 133/80 | HR 85

## 2016-11-20 DIAGNOSIS — E291 Testicular hypofunction: Secondary | ICD-10-CM | POA: Diagnosis not present

## 2016-11-20 MED ORDER — TESTOSTERONE CYPIONATE 200 MG/ML IM SOLN
300.0000 mg | Freq: Once | INTRAMUSCULAR | Status: AC
  Administered 2016-11-20: 300 mg via INTRAMUSCULAR

## 2016-11-20 NOTE — Progress Notes (Signed)
Pt is here for testosterone injection. Pt tolerated injection well the LUOQ without complications. Pt advised to make next appointment in 14 days.

## 2016-11-24 ENCOUNTER — Encounter: Payer: Self-pay | Admitting: Sports Medicine

## 2016-11-24 ENCOUNTER — Ambulatory Visit (INDEPENDENT_AMBULATORY_CARE_PROVIDER_SITE_OTHER): Payer: BLUE CROSS/BLUE SHIELD | Admitting: Sports Medicine

## 2016-11-24 ENCOUNTER — Ambulatory Visit (INDEPENDENT_AMBULATORY_CARE_PROVIDER_SITE_OTHER): Payer: BLUE CROSS/BLUE SHIELD

## 2016-11-24 DIAGNOSIS — M170 Bilateral primary osteoarthritis of knee: Secondary | ICD-10-CM

## 2016-11-24 DIAGNOSIS — M62838 Other muscle spasm: Secondary | ICD-10-CM | POA: Insufficient documentation

## 2016-11-24 DIAGNOSIS — M542 Cervicalgia: Secondary | ICD-10-CM | POA: Diagnosis not present

## 2016-11-24 DIAGNOSIS — M5489 Other dorsalgia: Secondary | ICD-10-CM | POA: Diagnosis not present

## 2016-11-24 MED ORDER — PREDNISONE 50 MG PO TABS: ORAL_TABLET | ORAL | 0 refills | Status: DC

## 2016-11-24 MED ORDER — GABAPENTIN 300 MG PO CAPS: 300.0000 mg | ORAL_CAPSULE | Freq: Every day | ORAL | 3 refills | Status: DC

## 2016-11-24 NOTE — Assessment & Plan Note (Signed)
Bilateral, left worse than right, likely related to underlying cervical spondylosis. Prednisone, x-rays, home rehabilitation exercises, increase gabapentin to 300 mg at bedtime. Return in 6 weeks, MR for interventional planning if no better.

## 2016-11-24 NOTE — Progress Notes (Signed)
   Subjective:    I'm seeing this patient as a consultation for:  Iran Planas, PA-C  CC: Neck pain, knee pain  HPI: Neck pain: Present for a week, localized between the shoulder blades, worse with frog in his shoulders. Occasional radiation down to the right hand. Moderate, persistent without lower extremity weakness, upper extremity weakness. No bowel or bladder dysfunction or saddle numbness. Not worse with prolonged downgaze.  Left knee pain: Medial joint line, moderate, persistent, with effusion.  Past medical history:  Negative.  See flowsheet/record as well for more information.  Surgical history: Negative.  See flowsheet/record as well for more information.  Family history: Negative.  See flowsheet/record as well for more information.  Social history: Negative.  See flowsheet/record as well for more information.  Allergies, and medications have been entered into the medical record, reviewed, and no changes needed.   Review of Systems: No headache, visual changes, nausea, vomiting, diarrhea, constipation, dizziness, abdominal pain, skin rash, fevers, chills, night sweats, weight loss, swollen lymph nodes, body aches, joint swelling, muscle aches, chest pain, shortness of breath, mood changes, visual or auditory hallucinations.   Objective:   General: Well Developed, well nourished, and in no acute distress.  Neuro/Psych: Alert and oriented x3, extra-ocular muscles intact, able to move all 4 extremities, sensation grossly intact. Skin: Warm and dry, no rashes noted.  Respiratory: Not using accessory muscles, speaking in full sentences, trachea midline.  Cardiovascular: Pulses palpable, no extremity edema. Abdomen: Does not appear distended. Left Knee: Tender to palpation at the medial joint line with a palpable effusion ROM normal in flexion and extension and lower leg rotation. Ligaments with solid consistent endpoints including ACL, PCL, LCL, MCL. Negative Mcmurray's and  provocative meniscal tests. Non painful patellar compression. Patellar and quadriceps tendons unremarkable. Hamstring and quadriceps strength is normal. Neck: Negative spurling's Full neck range of motion Grip strength and sensation normal in bilateral hands Strength good C4 to T1 distribution No sensory change to C4 to T1 Reflexes normal  Procedure: Real-time Ultrasound Guided aspiration/injection of left knee Device: GE Logiq E  Verbal informed consent obtained.  Time-out conducted.  Noted no overlying erythema, induration, or other signs of local infection.  Skin prepped in a sterile fashion.  Local anesthesia: Topical Ethyl chloride.  With sterile technique and under real time ultrasound guidance:  Aspirated 20 mL straw-colored fluid with an 18-gauge needle, syringe switched and 1 mL kenalog 40, 2 mL lidocaine, 2 mL Marcaine injected easily Completed without difficulty  Pain immediately resolved suggesting accurate placement of the medication.  Advised to call if fevers/chills, erythema, induration, drainage, or persistent bleeding.  Images permanently stored and available for review in the ultrasound unit.  Impression: Technically successful ultrasound guided injection.  Impression and Recommendations:   This case required medical decision making of moderate complexity.  Trapezius muscle spasm Bilateral, left worse than right, likely related to underlying cervical spondylosis. Prednisone, x-rays, home rehabilitation exercises, increase gabapentin to 300 mg at bedtime. Return in 6 weeks, MR for interventional planning if no better.  Osteoarthritis of both knees Left knee aspiration and injection as above.

## 2016-11-24 NOTE — Assessment & Plan Note (Signed)
Left knee aspiration and injection as above.

## 2016-12-04 ENCOUNTER — Ambulatory Visit: Payer: BLUE CROSS/BLUE SHIELD | Admitting: Sports Medicine

## 2016-12-04 ENCOUNTER — Ambulatory Visit (INDEPENDENT_AMBULATORY_CARE_PROVIDER_SITE_OTHER): Payer: BLUE CROSS/BLUE SHIELD | Admitting: Physician Assistant

## 2016-12-04 VITALS — BP 137/60 | HR 83 | Wt 278.0 lb

## 2016-12-04 DIAGNOSIS — E291 Testicular hypofunction: Secondary | ICD-10-CM | POA: Diagnosis not present

## 2016-12-04 DIAGNOSIS — Z1211 Encounter for screening for malignant neoplasm of colon: Secondary | ICD-10-CM

## 2016-12-04 MED ORDER — TESTOSTERONE CYPIONATE 200 MG/ML IM SOLN
300.0000 mg | Freq: Once | INTRAMUSCULAR | Status: AC
  Administered 2016-12-04: 300 mg via INTRAMUSCULAR

## 2016-12-04 NOTE — Progress Notes (Signed)
   Subjective:    Patient ID: Leonie Man, male    DOB: 01-Dec-1966, 50 y.o.   MRN: [id number]   HPI   Jaxsen Naftzger Szczepanik is here for a testosterone injection. Denies chest pain, shortness of breath, headaches or mood changes.   Health Maintenance - Vandan has recently turned 31 and is due for a screening colonoscopy. He has agreed to the colonoscopy. Denies nausea, vomiting, diarrhea, constipation or blood in stool.  DM - He is due for a diabetic eye exam.     Review of Systems  Constitutional: Negative for chills and fever.  HENT: Negative.   Eyes: Negative.   Respiratory: Negative for cough and shortness of breath.   Cardiovascular: Negative for chest pain and palpitations.  Gastrointestinal: Negative for anal bleeding, blood in stool, constipation, diarrhea, nausea and vomiting.  Genitourinary: Negative.   Neurological: Negative.   Psychiatric/Behavioral: Negative for agitation.       Objective:   Physical Exam        Assessment & Plan:  Patient tolerated injection well without complications. Patient advised to schedule next injection 14 days from today.   Screening Colonoscopy - GI referral ordered.   DM - Patient will schedule a follow up eye exam.

## 2016-12-10 ENCOUNTER — Other Ambulatory Visit: Payer: Self-pay | Admitting: Physician Assistant

## 2016-12-18 ENCOUNTER — Ambulatory Visit (INDEPENDENT_AMBULATORY_CARE_PROVIDER_SITE_OTHER): Payer: BLUE CROSS/BLUE SHIELD | Admitting: Sports Medicine

## 2016-12-18 VITALS — BP 138/73 | HR 66 | Wt 286.0 lb

## 2016-12-18 DIAGNOSIS — E291 Testicular hypofunction: Secondary | ICD-10-CM | POA: Diagnosis not present

## 2016-12-18 MED ORDER — TESTOSTERONE CYPIONATE 200 MG/ML IM SOLN
300.0000 mg | Freq: Once | INTRAMUSCULAR | Status: AC
  Administered 2016-12-18: 300 mg via INTRAMUSCULAR

## 2016-12-18 NOTE — Progress Notes (Signed)
Patient here for testosterone injection denies SOB,CP or mood swings. Injection tolerated well given in LUOQ. Pt will RTC in 14 days for next injection. Maryruth Eve, Kenney Houseman [place]  Pt has an appt in January to have eye exam done.Maryruth Eve, Lahoma Crocker

## 2016-12-31 ENCOUNTER — Encounter: Payer: Self-pay | Admitting: Physician Assistant

## 2016-12-31 DIAGNOSIS — D123 Benign neoplasm of transverse colon: Secondary | ICD-10-CM | POA: Diagnosis not present

## 2016-12-31 DIAGNOSIS — D125 Benign neoplasm of sigmoid colon: Secondary | ICD-10-CM | POA: Diagnosis not present

## 2016-12-31 DIAGNOSIS — Z1211 Encounter for screening for malignant neoplasm of colon: Secondary | ICD-10-CM | POA: Diagnosis not present

## 2016-12-31 LAB — HM COLONOSCOPY

## 2017-01-01 ENCOUNTER — Encounter: Payer: Self-pay | Admitting: Sports Medicine

## 2017-01-01 ENCOUNTER — Ambulatory Visit (INDEPENDENT_AMBULATORY_CARE_PROVIDER_SITE_OTHER): Payer: BLUE CROSS/BLUE SHIELD | Admitting: Sports Medicine

## 2017-01-01 ENCOUNTER — Encounter: Payer: Self-pay | Admitting: Physician Assistant

## 2017-01-01 VITALS — BP 128/81 | HR 77 | Resp 18 | Wt 275.0 lb

## 2017-01-01 DIAGNOSIS — K5730 Diverticulosis of large intestine without perforation or abscess without bleeding: Secondary | ICD-10-CM | POA: Insufficient documentation

## 2017-01-01 DIAGNOSIS — M62838 Other muscle spasm: Secondary | ICD-10-CM

## 2017-01-01 DIAGNOSIS — E291 Testicular hypofunction: Secondary | ICD-10-CM | POA: Diagnosis not present

## 2017-01-01 DIAGNOSIS — M170 Bilateral primary osteoarthritis of knee: Secondary | ICD-10-CM

## 2017-01-01 DIAGNOSIS — K635 Polyp of colon: Secondary | ICD-10-CM | POA: Insufficient documentation

## 2017-01-01 MED ORDER — TESTOSTERONE CYPIONATE 200 MG/ML IM SOLN
300.0000 mg | Freq: Once | INTRAMUSCULAR | Status: AC
  Administered 2017-01-01: 300 mg via INTRAMUSCULAR

## 2017-01-01 MED ORDER — PREDNISONE 10 MG (48) PO TBPK: ORAL_TABLET | Freq: Every day | ORAL | 0 refills | Status: DC

## 2017-01-01 NOTE — Assessment & Plan Note (Signed)
Left knee is pain-free after aspiration and injection last month

## 2017-01-01 NOTE — Progress Notes (Signed)
  Subjective:    CC: Follow-up  HPI: Knee arthritis: Resolved after injection  Trapezial pain: Improved significantly on steroids, was essentially gone with his rehabilitation exercises but is starting to have a recurrence.  Past medical history:  Negative.  See flowsheet/record as well for more information.  Surgical history: Negative.  See flowsheet/record as well for more information.  Family history: Negative.  See flowsheet/record as well for more information.  Social history: Negative.  See flowsheet/record as well for more information.  Allergies, and medications have been entered into the medical record, reviewed, and no changes needed.   Review of Systems: No fevers, chills, night sweats, weight loss, chest pain, or shortness of breath.   Objective:    General: Well Developed, well nourished, and in no acute distress.  Neuro: Alert and oriented x3, extra-ocular muscles intact, sensation grossly intact.  HEENT: Normocephalic, atraumatic, pupils equal round reactive to light, neck supple, no masses, no lymphadenopathy, thyroid nonpalpable.  Skin: Warm and dry, no rashes. Cardiac: Regular rate and rhythm, no murmurs rubs or gallops, no lower extremity edema.  Respiratory: Clear to auscultation bilaterally. Not using accessory muscles, speaking in full sentences. Neck: Negative spurling's Full neck range of motion Grip strength and sensation normal in bilateral hands Strength good C4 to T1 distribution No sensory change to C4 to T1 Reflexes normal  Impression and Recommendations:    Trapezius muscle spasm Having a slight recurrence of pain, tells me he is doing his exercises, he did extremely well with steroids. Adding another prednisone but in a taper this time, he will make sure to take 300 of gabapentin at bedtime. Return in 6 weeks, at that point we will proceed with MRI, he declined MRI at this juncture.  Osteoarthritis of both knees Left knee is pain-free after  aspiration and injection last month  I spent 25 minutes with this patient, greater than 50% was face-to-face time counseling regarding the above diagnoses

## 2017-01-01 NOTE — Assessment & Plan Note (Signed)
Having a slight recurrence of pain, tells me he is doing his exercises, he did extremely well with steroids. Adding another prednisone but in a taper this time, he will make sure to take 300 of gabapentin at bedtime. Return in 6 weeks, at that point we will proceed with MRI, he declined MRI at this juncture.

## 2017-01-05 ENCOUNTER — Ambulatory Visit (INDEPENDENT_AMBULATORY_CARE_PROVIDER_SITE_OTHER): Payer: BLUE CROSS/BLUE SHIELD | Admitting: Physician Assistant

## 2017-01-05 ENCOUNTER — Telehealth: Payer: Self-pay | Admitting: *Deleted

## 2017-01-05 ENCOUNTER — Encounter: Payer: Self-pay | Admitting: Physician Assistant

## 2017-01-05 VITALS — BP 114/74 | HR 99 | Temp 98.0°F | Ht 71.0 in | Wt 271.0 lb

## 2017-01-05 DIAGNOSIS — G43A0 Cyclical vomiting, not intractable: Secondary | ICD-10-CM | POA: Diagnosis not present

## 2017-01-05 DIAGNOSIS — K529 Noninfective gastroenteritis and colitis, unspecified: Secondary | ICD-10-CM | POA: Diagnosis not present

## 2017-01-05 DIAGNOSIS — R1115 Cyclical vomiting syndrome unrelated to migraine: Secondary | ICD-10-CM

## 2017-01-05 MED ORDER — ONDANSETRON HCL 4 MG PO TABS: 4.0000 mg | ORAL_TABLET | Freq: Three times a day (TID) | ORAL | 0 refills | Status: DC | PRN

## 2017-01-05 MED ORDER — PROMETHAZINE HCL 25 MG/ML IJ SOLN
25.0000 mg | Freq: Once | INTRAMUSCULAR | Status: AC
  Administered 2017-01-05: 25 mg via INTRAMUSCULAR

## 2017-01-05 NOTE — Telephone Encounter (Signed)
copay by pass initiated

## 2017-01-05 NOTE — Patient Instructions (Signed)
Norovirus Infection A norovirus infection is caused by exposure to a virus in a group of similar viruses (noroviruses). This type of infection causes inflammation in your stomach and intestines (gastroenteritis). Norovirus is the most common cause of gastroenteritis. It also causes food poisoning. Anyone can get a norovirus infection. It spreads very easily (contagious). You can get it from contaminated food, water, surfaces, or other people. Norovirus is found in the stool or vomit of infected people. You can spread the infection as soon as you feel sick until 2 weeks after you recover.  Symptoms usually begin within 2 days after you become infected. Most norovirus symptoms affect the digestive system. CAUSES Norovirus infection is caused by contact with norovirus. You can catch norovirus if you:  Eat or drink something contaminated with norovirus.  Touch surfaces or objects contaminated with norovirus and then put your hand in your mouth.  Have direct contact with an infected person who has symptoms.  Share food, drink, or utensils with someone with who is sick with norovirus. SIGNS AND SYMPTOMS Symptoms of norovirus may include:  Nausea.  Vomiting.  Diarrhea.  Stomach cramps.  Fever.  Chills.  Headache.  Muscle aches.  Tiredness. DIAGNOSIS Your health care provider may suspect norovirus based on your symptoms and physical exam. Your health care provider may also test a sample of your stool or vomit for the virus.  TREATMENT There is no specific treatment for norovirus. Most people get better without treatment in about 2 days. HOME CARE INSTRUCTIONS  Replace lost fluids by drinking plenty of water or rehydration fluids containing important minerals called electrolytes. This prevents dehydration. Drink enough fluid to keep your urine clear or pale yellow.  Do not prepare food for others while you are infected. Wait at least 3 days after recovering from the illness to do  that. PREVENTION   Wash your hands often, especially after using the toilet or changing a diaper.  Wash fruits and vegetables thoroughly before preparing or serving them.  Throw out any food that a sick person may have touched.  Disinfect contaminated surfaces immediately after someone in the household has been sick. Use a bleach-based household cleaner.  Immediately remove and wash soiled clothes or sheets. SEEK MEDICAL CARE IF:  Your vomiting, diarrhea, and stomach pain is getting worse.  Your symptoms of norovirus do not go away after 2-3 days. SEEK IMMEDIATE MEDICAL CARE IF:  You develop symptoms of dehydration that do not improve with fluid replacement. This may include:  Excessive sleepiness.  Lack of tears.  Dry mouth.  Dizziness when standing.  Weak pulse. This information is not intended to replace advice given to you by your health care provider. Make sure you discuss any questions you have with your health care provider. Document Released: 02/27/2003 Document Revised: 12/28/2014 Document Reviewed: 05/17/2014 Elsevier Interactive Patient Education  2017 [place] Choices to Help Relieve Diarrhea, Adult When you have diarrhea, the foods you eat and your eating habits are very important. Choosing the right foods and drinks can help relieve diarrhea. Also, because diarrhea can last up to 7 days, you need to replace lost fluids and electrolytes (such as sodium, potassium, and chloride) in order to help prevent dehydration. What general guidelines do I need to follow?  Slowly drink 1 cup (8 oz) of fluid for each episode of diarrhea. If you are getting enough fluid, your urine will be clear or pale yellow.  Eat starchy foods. Some good choices include white rice, white toast,  pasta, low-fiber cereal, baked potatoes (without the skin), saltine crackers, and bagels.  Avoid large servings of any cooked vegetables.  Limit fruit to two servings per day. A serving is   cup or 1 small piece.  Choose foods with less than 2 g of fiber per serving.  Limit fats to less than 8 tsp (38 g) per day.  Avoid fried foods.  Eat foods that have probiotics in them. Probiotics can be found in certain dairy products.  Avoid foods and beverages that may increase the speed at which food moves through the stomach and intestines (gastrointestinal tract). Things to avoid include:  High-fiber foods, such as dried fruit, raw fruits and vegetables, nuts, seeds, and whole grain foods.  Spicy foods and high-fat foods.  Foods and beverages sweetened with high-fructose corn syrup, honey, or sugar alcohols such as xylitol, sorbitol, and mannitol. What foods are recommended? Grains  White rice. White, [place], or pita breads (fresh or toasted), including plain rolls, buns, or bagels. White pasta. Saltine, soda, or graham crackers. Pretzels. Low-fiber cereal. Cooked cereals made with water (such as cornmeal, farina, or cream cereals). Plain muffins. Matzo. Melba toast. Zwieback. Vegetables  Potatoes (without the skin). Strained tomato and vegetable juices. Most well-cooked and canned vegetables without seeds. Tender lettuce. Fruits  Cooked or canned applesauce, apricots, cherries, fruit cocktail, grapefruit, peaches, pears, or plums. Fresh bananas, apples without skin, cherries, grapes, cantaloupe, grapefruit, peaches, oranges, or plums. Meat and Other Protein Products  Baked or boiled chicken. Eggs. Tofu. Fish. Seafood. Smooth peanut butter. Ground or well-cooked tender beef, ham, veal, lamb, pork, or poultry. Dairy  Plain yogurt, kefir, and unsweetened liquid yogurt. Lactose-free milk, buttermilk, or soy milk. Plain hard cheese. Beverages  Sport drinks. Clear broths. Diluted fruit juices (except prune). Regular, caffeine-free sodas such as ginger ale. Water. Decaffeinated teas. Oral rehydration solutions. Sugar-free beverages not sweetened with sugar alcohols. Other  Bouillon,  broth, or soups made from recommended foods. The items listed above may not be a complete list of recommended foods or beverages. Contact your dietitian for more options.  What foods are not recommended? Grains  Whole grain, whole wheat, bran, or rye breads, rolls, pastas, crackers, and cereals. Wild or brown rice. Cereals that contain more than 2 g of fiber per serving. Corn tortillas or taco shells. Cooked or dry oatmeal. Granola. Popcorn. Vegetables  Raw vegetables. Cabbage, broccoli, Brussels sprouts, artichokes, baked beans, beet greens, corn, kale, legumes, peas, sweet potatoes, and yams. Potato skins. Cooked spinach and cabbage. Fruits  Dried fruit, including raisins and dates. Raw fruits. Stewed or dried prunes. Fresh apples with skin, apricots, mangoes, pears, raspberries, and strawberries. Meat and Other Protein Products  Chunky peanut butter. Nuts and seeds. Beans and lentils. Berniece Salines. Dairy  High-fat cheeses. Milk, chocolate milk, and beverages made with milk, such as milk shakes. Cream. Ice cream. Sweets and Desserts  Sweet rolls, doughnuts, and sweet breads. Pancakes and waffles. Fats and Oils  Butter. Cream sauces. Margarine. Salad oils. Plain salad dressings. Olives. Avocados. Beverages  Caffeinated beverages (such as coffee, tea, soda, or energy drinks). Alcoholic beverages. Fruit juices with pulp. Prune juice. Soft drinks sweetened with high-fructose corn syrup or sugar alcohols. Other  Coconut. Hot sauce. Chili powder. Mayonnaise. Gravy. Cream-based or milk-based soups. The items listed above may not be a complete list of foods and beverages to avoid. Contact your dietitian for more information.  What should I do if I become dehydrated? Diarrhea can sometimes lead to dehydration. Signs of dehydration  include dark urine and dry mouth and skin. If you think you are dehydrated, you should rehydrate with an oral rehydration solution. These solutions can be purchased at pharmacies,  retail stores, or online. Drink -1 cup (120-240 mL) of oral rehydration solution each time you have an episode of diarrhea. If drinking this amount makes your diarrhea worse, try drinking smaller amounts more often. For example, drink 1-3 tsp (5-15 mL) every 5-10 minutes. A general rule for staying hydrated is to drink 1-2 L of fluid per day. Talk to your health care provider about the specific amount you should be drinking each day. Drink enough fluids to keep your urine clear or pale yellow. This information is not intended to replace advice given to you by your health care provider. Make sure you discuss any questions you have with your health care provider. Document Released: 02/27/2004 Document Revised: 05/14/2016 Document Reviewed: 10/30/2013 Elsevier Interactive Patient Education  2017 [employer].

## 2017-01-05 NOTE — Progress Notes (Addendum)
   Subjective:    Patient ID: Brandon Mckee, male    DOB: 1966-07-13, 51 y.o.   MRN: [id number]  HPI  Patient is a 51yo male who presents to the clinic with nausea, vomiting, and diarrhea since last night.  Patient states he had chills and body aches last night and today he feels weak.  Patient denies any blood in vomit or stool and states his stools are watery. Patient states he felt pressure in the epigastric region and was belching, which proceeded to vomiting. Patient denies any associated cough, sore throat, or congestion.  Patient states his daughter had similar symptoms 2 days ago, which have since resolved.  He denies eating anything new or out of the ordinary for his diet.  Patient denies taking anything OTC for the nausea, vomiting, or diarrhea.  He states the only thing he has been able to keep down is ginger ale  Patient reports he had a colonoscopy last Thursday, and he denies any complications.  Patient denies any recent antibiotic use.       Review of Systems  Gastrointestinal: Positive for diarrhea, nausea and vomiting.  All other systems reviewed and are negative.      Objective:   Physical Exam  Constitutional: He is oriented to person, place, and time. He appears well-developed and well-nourished.  HENT:  Head: Normocephalic and atraumatic.  Cardiovascular: Regular rhythm and normal heart sounds.   Pulmonary/Chest: Effort normal and breath sounds normal.  Abdominal: Soft. Bowel sounds are normal. He exhibits no mass. There is no tenderness. There is no rebound and no guarding.  Lymphadenopathy:    He has no cervical adenopathy.  Neurological: He is alert and oriented to person, place, and time.  Skin: Skin is warm and dry.  Psychiatric: He has a normal mood and affect. His behavior is normal.  Nursing note and vitals reviewed.         Assessment & Plan:  [name] KitchenMarland KitchenJyson was seen today for diarrhea and emesis.  Diagnoses and all orders for this  visit:  Gastroenteritis -     ondansetron (ZOFRAN) 4 MG tablet; Take 1 tablet (4 mg total) by mouth every 8 (eight) hours as needed for nausea or vomiting. -     promethazine (PHENERGAN) injection 25 mg; Inject 1 mL (25 mg total) into the muscle once.  Non-intractable cyclical vomiting with nausea -     ondansetron (ZOFRAN) 4 MG tablet; Take 1 tablet (4 mg total) by mouth every 8 (eight) hours as needed for nausea or vomiting. -     promethazine (PHENERGAN) injection 25 mg; Inject 1 mL (25 mg total) into the muscle once.    Encouraged [employer].  Instructed to increase oral intake to stay hydrated as well.  Patient received a phenergan 25mg  IM in the office today. Explained that it can cause drowsiness. Patient can take zofran 4mg  every 8 hours for vomiting.  Educated patient on side effects.  Patient reassured colonoscopy was not the likely cause and that this is likely due to viral gastroenteritis that is very contagious, especially among family members in the same household.   Follow-up as needed.

## 2017-01-13 ENCOUNTER — Other Ambulatory Visit: Payer: Self-pay | Admitting: Physician Assistant

## 2017-01-15 ENCOUNTER — Ambulatory Visit (INDEPENDENT_AMBULATORY_CARE_PROVIDER_SITE_OTHER): Payer: BLUE CROSS/BLUE SHIELD | Admitting: Sports Medicine

## 2017-01-15 VITALS — BP 137/76 | HR 69

## 2017-01-15 DIAGNOSIS — E291 Testicular hypofunction: Secondary | ICD-10-CM

## 2017-01-15 MED ORDER — TESTOSTERONE CYPIONATE 200 MG/ML IM SOLN
300.0000 mg | Freq: Once | INTRAMUSCULAR | Status: AC
  Administered 2017-01-15: 300 mg via INTRAMUSCULAR

## 2017-01-15 NOTE — Progress Notes (Signed)
Patient came into office today for testosterone injection. Denies chest pain, shortness of breath, headaches and problems associated with taking this medication. Patient states he has had no abnornal mood swings. Patient tolerated injection in LUOQ well without complications. Patient advised to schedule his next injection for 2 weeks from today. 

## 2017-01-22 ENCOUNTER — Encounter: Payer: Self-pay | Admitting: Physician Assistant

## 2017-01-22 ENCOUNTER — Ambulatory Visit (INDEPENDENT_AMBULATORY_CARE_PROVIDER_SITE_OTHER): Payer: BLUE CROSS/BLUE SHIELD | Admitting: Physician Assistant

## 2017-01-22 VITALS — BP 123/71 | HR 84 | Ht 71.0 in | Wt 285.0 lb

## 2017-01-22 DIAGNOSIS — G4733 Obstructive sleep apnea (adult) (pediatric): Secondary | ICD-10-CM

## 2017-01-22 DIAGNOSIS — Z9989 Dependence on other enabling machines and devices: Secondary | ICD-10-CM

## 2017-01-22 DIAGNOSIS — E119 Type 2 diabetes mellitus without complications: Secondary | ICD-10-CM | POA: Diagnosis not present

## 2017-01-22 LAB — COMPLETE METABOLIC PANEL WITH GFR
ALBUMIN: 4.2 g/dL (ref 3.6–5.1)
ALT: 28 U/L (ref 9–46)
AST: 17 U/L (ref 10–35)
Alkaline Phosphatase: 110 U/L (ref 40–115)
BILIRUBIN TOTAL: 0.8 mg/dL (ref 0.2–1.2)
BUN: 10 mg/dL (ref 7–25)
CALCIUM: 8.5 mg/dL — AB (ref 8.6–10.3)
CO2: 32 mmol/L — ABNORMAL HIGH (ref 20–31)
CREATININE: 0.96 mg/dL (ref 0.70–1.33)
Chloride: 100 mmol/L (ref 98–110)
GFR, Est African American: >89 mL/min (ref >=60)
GFR, Est Non African American: >89 mL/min (ref >=60)
Glucose, Bld: 208 mg/dL — ABNORMAL HIGH (ref 65–99)
Potassium: 3.6 mmol/L (ref 3.5–5.3)
Sodium: 139 mmol/L (ref 135–146)
Total Protein: 6.5 g/dL (ref 6.1–8.1)

## 2017-01-22 LAB — POCT GLYCOSYLATED HEMOGLOBIN (HGB A1C): Hemoglobin A1C: 6.6

## 2017-01-22 MED ORDER — AMBULATORY NON FORMULARY MEDICATION: 0 refills | Status: DC

## 2017-01-22 NOTE — Progress Notes (Signed)
   Subjective:    Patient ID: Leonie Man, male    DOB: [birth year], 51 y.o.   MRN: [id number]  HPI  Pt is a 51 yo male with DM that presents for 3 month follow up.   He is not checking sugars. He is taking all meds except xigduo. This year going to cost 50 dollars. No hypoglycemia. He continues to limit sugars/carbs. Not exercising. No open sores or wounds.    He is using CPAP machine and does feel better. He needs new mask.     Review of Systems  All other systems reviewed and are negative.      Objective:   Physical Exam  Constitutional: He is oriented to person, place, and time. He appears well-developed and well-nourished.  HENT:  Head: Normocephalic and atraumatic.  Cardiovascular: Normal rate, regular rhythm and normal heart sounds.   Pulmonary/Chest: Effort normal and breath sounds normal.  Neurological: He is alert and oriented to person, place, and time.  Psychiatric: He has a normal mood and affect. His behavior is normal.          Assessment & Plan:  [name] KitchenMarland KitchenAmine was seen today for diabetes.  Diagnoses and all orders for this visit:  Controlled type 2 diabetes mellitus without complication, without long-term current use of insulin (HCC) -     POCT HgB A1C -     COMPLETE METABOLIC PANEL WITH GFR  OSA on CPAP -     AMBULATORY NON FORMULARY MEDICATION; Continuous positive airway pressure (CPAP) mask and hose needed for machine.  gave coupon card for xigduo. Will have amber call pharmacy.  Need eye exam.  A!C looks good today.  Follow up in 3 months.

## 2017-01-29 ENCOUNTER — Ambulatory Visit: Payer: BLUE CROSS/BLUE SHIELD

## 2017-02-02 ENCOUNTER — Ambulatory Visit (INDEPENDENT_AMBULATORY_CARE_PROVIDER_SITE_OTHER): Payer: BLUE CROSS/BLUE SHIELD | Admitting: Physician Assistant

## 2017-02-02 VITALS — BP 148/90 | HR 67 | Wt 276.0 lb

## 2017-02-02 DIAGNOSIS — E291 Testicular hypofunction: Secondary | ICD-10-CM

## 2017-02-02 MED ORDER — TESTOSTERONE CYPIONATE 200 MG/ML IM SOLN
300.0000 mg | INTRAMUSCULAR | Status: DC
  Administered 2017-02-02: 300 mg via INTRAMUSCULAR

## 2017-02-02 NOTE — Progress Notes (Signed)
Patient was in the office for testosterone injection. 300 mg of DepoTestosterone was given RUOQ. Patient denied any headaches, dizziness or SOB. Patient did stated that he did not take his blood pressure medication today. Rhonda Cunningham,CMA

## 2017-02-12 ENCOUNTER — Ambulatory Visit (INDEPENDENT_AMBULATORY_CARE_PROVIDER_SITE_OTHER): Payer: BLUE CROSS/BLUE SHIELD | Admitting: Sports Medicine

## 2017-02-12 ENCOUNTER — Encounter: Payer: Self-pay | Admitting: Sports Medicine

## 2017-02-12 DIAGNOSIS — M62838 Other muscle spasm: Secondary | ICD-10-CM | POA: Diagnosis not present

## 2017-02-12 DIAGNOSIS — M170 Bilateral primary osteoarthritis of knee: Secondary | ICD-10-CM

## 2017-02-12 NOTE — Assessment & Plan Note (Signed)
Continues to do well after left knee aspiration and injection several months ago

## 2017-02-12 NOTE — Progress Notes (Signed)
  Subjective:    CC: Follow-up  HPI: Neck and trapezial pain: Resolved with gabapentin and an additional taper of steroids.  Past medical history:  Negative.  See flowsheet/record as well for more information.  Surgical history: Negative.  See flowsheet/record as well for more information.  Family history: Negative.  See flowsheet/record as well for more information.  Social history: Negative.  See flowsheet/record as well for more information.  Allergies, and medications have been entered into the medical record, reviewed, and no changes needed.   Review of Systems: No fevers, chills, night sweats, weight loss, chest pain, or shortness of breath.   Objective:    General: Well Developed, well nourished, and in no acute distress.  Neuro: Alert and oriented x3, extra-ocular muscles intact, sensation grossly intact.  HEENT: Normocephalic, atraumatic, pupils equal round reactive to light, neck supple, no masses, no lymphadenopathy, thyroid nonpalpable.  Skin: Warm and dry, no rashes. Cardiac: Regular rate and rhythm, no murmurs rubs or gallops, no lower extremity edema.  Respiratory: Clear to auscultation bilaterally. Not using accessory muscles, speaking in full sentences.  Impression and Recommendations:    Osteoarthritis of both knees Continues to do well after left knee aspiration and injection several months ago  Trapezius muscle spasm Continues to do well after adding another prednisone taper, and 300 mg of gabapentin, currently pain-free. This is likely related to cervical spondylosis so we will proceed with an MRI at the next visit if persistent symptoms.

## 2017-02-12 NOTE — Assessment & Plan Note (Signed)
Continues to do well after adding another prednisone taper, and 300 mg of gabapentin, currently pain-free. This is likely related to cervical spondylosis so we will proceed with an MRI at the next visit if persistent symptoms.

## 2017-02-19 ENCOUNTER — Ambulatory Visit (INDEPENDENT_AMBULATORY_CARE_PROVIDER_SITE_OTHER): Payer: BLUE CROSS/BLUE SHIELD | Admitting: Physician Assistant

## 2017-02-19 VITALS — BP 127/89 | HR 76

## 2017-02-19 DIAGNOSIS — E291 Testicular hypofunction: Secondary | ICD-10-CM | POA: Diagnosis not present

## 2017-02-19 MED ORDER — TESTOSTERONE CYPIONATE 200 MG/ML IM SOLN
300.0000 mg | Freq: Once | INTRAMUSCULAR | Status: AC
  Administered 2017-02-19: 300 mg via INTRAMUSCULAR

## 2017-02-19 MED ORDER — TESTOSTERONE ENANTHATE 200 MG/ML IM SOLN: 300.0000 mg | Freq: Once | INTRAMUSCULAR | Status: DC

## 2017-02-19 NOTE — Progress Notes (Signed)
   Subjective:    Patient ID: Brandon Mckee, male    DOB: 07-31-1966, 51 y.o.   MRN: [id number]  Brandon Mckee is here for a testosterone injection. Denies chest pain, shortness of breath, headaches or mood changes.        Review of Systems  Constitutional: Negative.   Eyes: Negative.   Cardiovascular: Negative for chest pain, palpitations and leg swelling.  Psychiatric/Behavioral: Negative.  Negative for dysphoric mood.       Objective:   Physical Exam        Assessment & Plan:  Patient tolerated injection well without complications. Patient advised to schedule next injection 14 days from today.

## 2017-02-28 ENCOUNTER — Encounter: Payer: Self-pay | Admitting: Physician Assistant

## 2017-03-01 ENCOUNTER — Other Ambulatory Visit: Payer: Self-pay | Admitting: Physician Assistant

## 2017-03-05 ENCOUNTER — Ambulatory Visit (INDEPENDENT_AMBULATORY_CARE_PROVIDER_SITE_OTHER): Payer: BLUE CROSS/BLUE SHIELD | Admitting: Physician Assistant

## 2017-03-05 VITALS — BP 137/80 | HR 76

## 2017-03-05 DIAGNOSIS — E291 Testicular hypofunction: Secondary | ICD-10-CM

## 2017-03-05 MED ORDER — TESTOSTERONE CYPIONATE 200 MG/ML IM SOLN
300.0000 mg | Freq: Once | INTRAMUSCULAR | Status: AC
  Administered 2017-03-05: 300 mg via INTRAMUSCULAR

## 2017-03-05 NOTE — Progress Notes (Signed)
Patient came into office today for testosterone injection. Denies chest pain, shortness of breath, headaches and problems associated with taking this medication. Patient states he has had no abnornal mood swings. Patient tolerated injection in RUOQ well without complications. Patient advised to schedule his next injection for 2 weeks from today. 

## 2017-03-22 ENCOUNTER — Ambulatory Visit (INDEPENDENT_AMBULATORY_CARE_PROVIDER_SITE_OTHER): Payer: BLUE CROSS/BLUE SHIELD | Admitting: Physician Assistant

## 2017-03-22 VITALS — BP 126/81 | HR 70 | Ht 71.0 in | Wt 278.1 lb

## 2017-03-22 DIAGNOSIS — E291 Testicular hypofunction: Secondary | ICD-10-CM | POA: Diagnosis not present

## 2017-03-22 MED ORDER — TESTOSTERONE CYPIONATE 200 MG/ML IM SOLN
300.0000 mg | Freq: Once | INTRAMUSCULAR | Status: AC
  Administered 2017-03-22: 300 mg via INTRAMUSCULAR

## 2017-03-22 NOTE — Progress Notes (Signed)
   Subjective:    Patient ID: Brandon Mckee, male    DOB: December 22, 1965, 51 y.o.   MRN: [id number]  HPI Pt is here for a Testosterone Injection today. Pt denies mood swings, palpitations, chest pain, or any medication problems.     Review of Systems     Objective:   Physical Exam        Assessment & Plan:  Pt tolerated injection in LUOQ well and without complications. Pt advised to schedule a nurse visit in 14 days.

## 2017-03-27 ENCOUNTER — Other Ambulatory Visit: Payer: Self-pay | Admitting: Sports Medicine

## 2017-03-27 DIAGNOSIS — M62838 Other muscle spasm: Secondary | ICD-10-CM

## 2017-04-05 IMAGING — DX DG CERVICAL SPINE COMPLETE 4+V
6 series · 6 of 6 positions shown · non-contrast
Comparison: None.

CLINICAL DATA: Interscapular back pain, 1 week duration.

EXAM:
CERVICAL SPINE - COMPLETE 4+ VIEW

[c-spine lat]
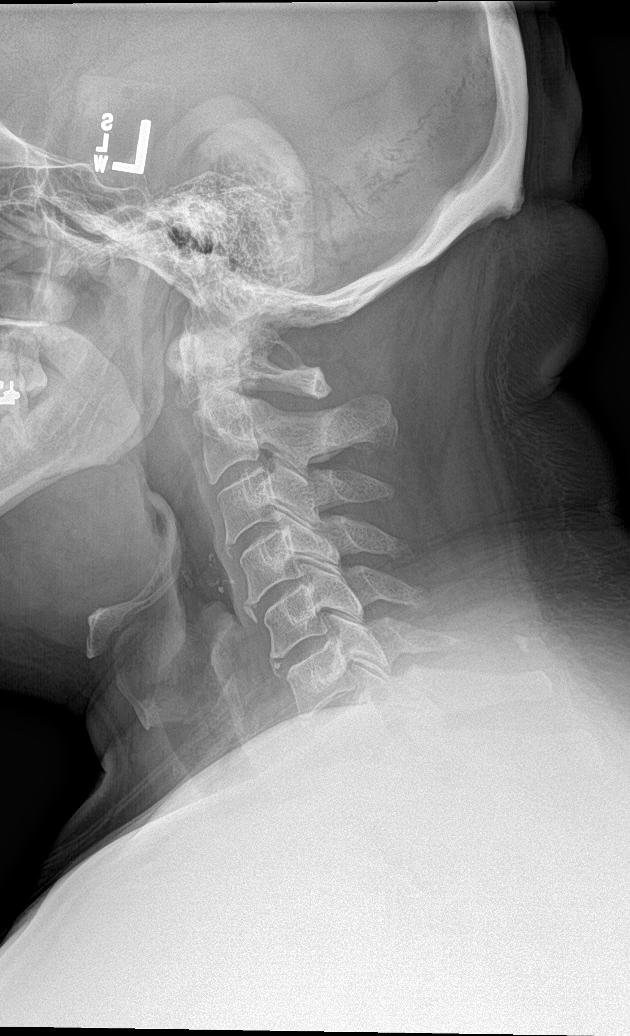

[c-spine obl (1 of 2)]
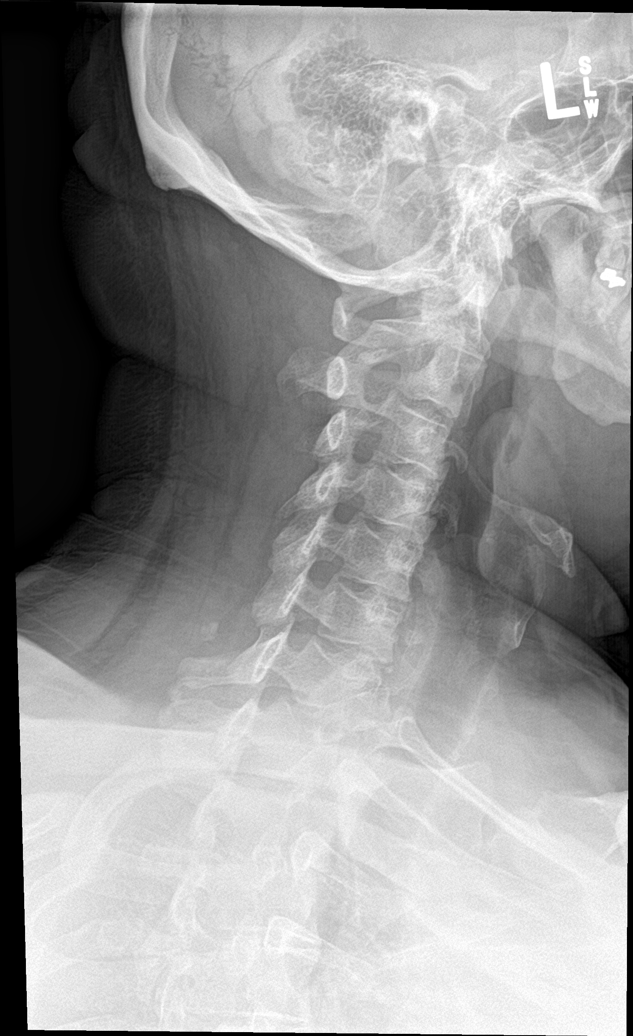

[c-spine obl (2 of 2)]
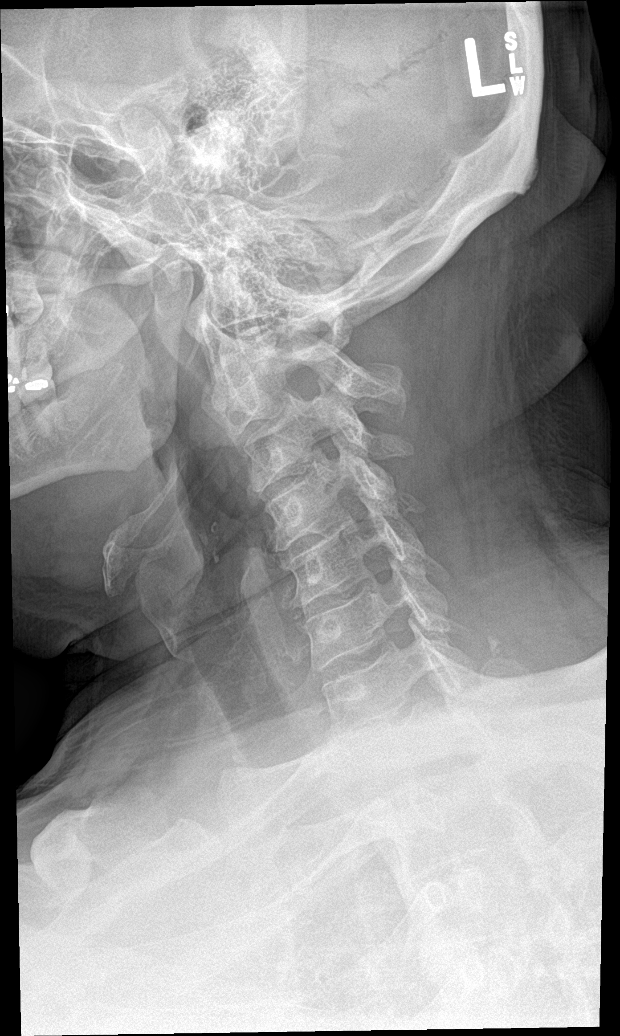

[c-spine ap]
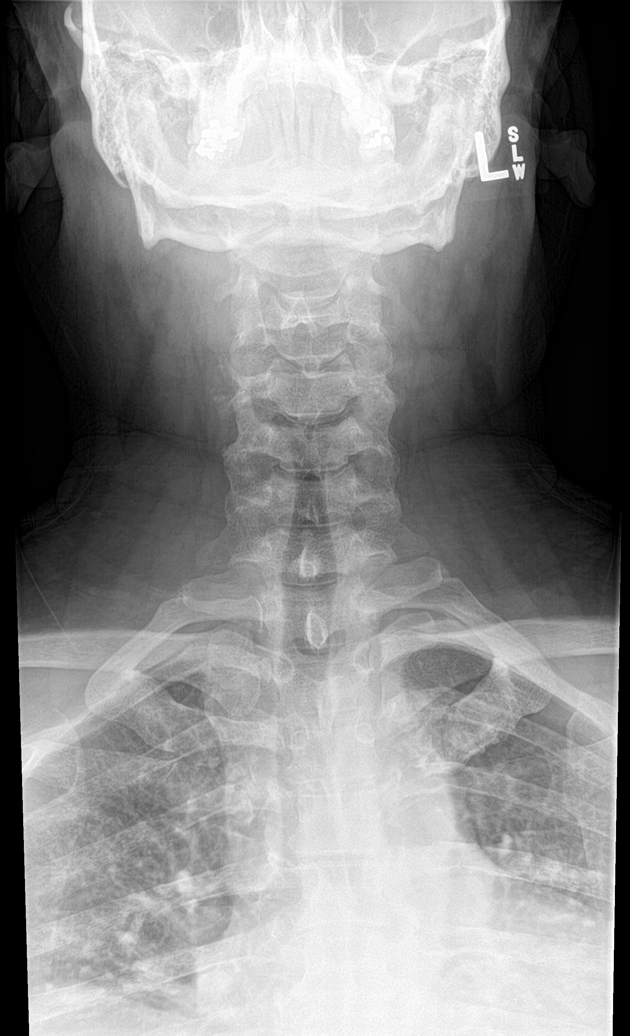

[c-spine open mouth]
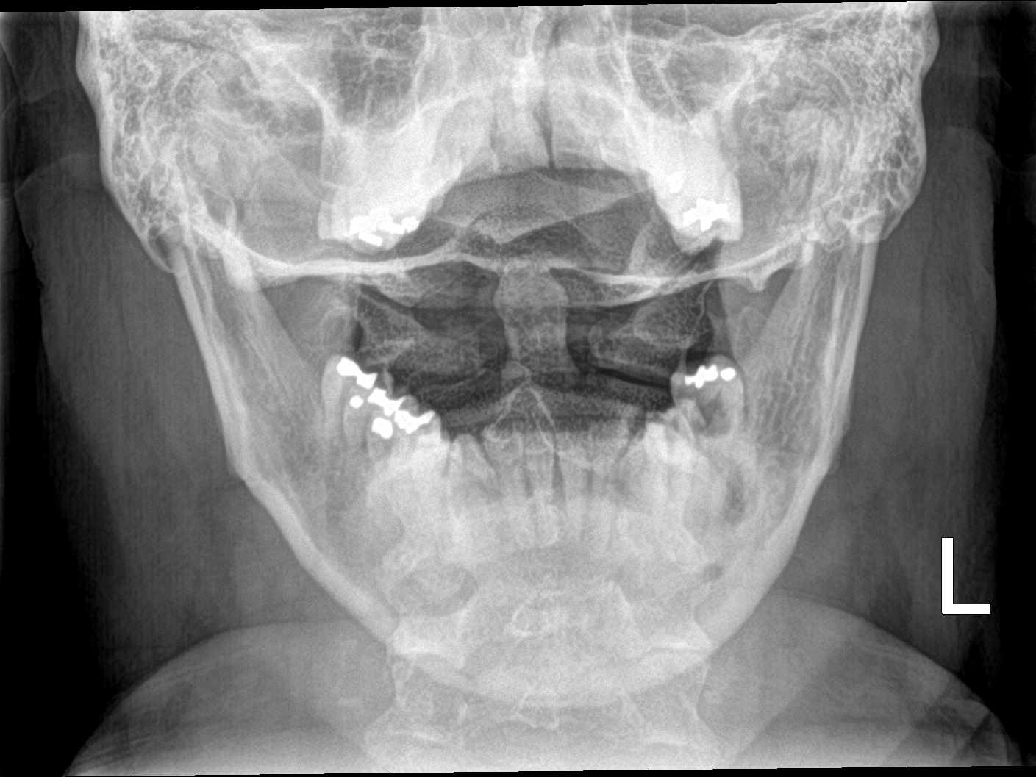

[c-spine swimmers]
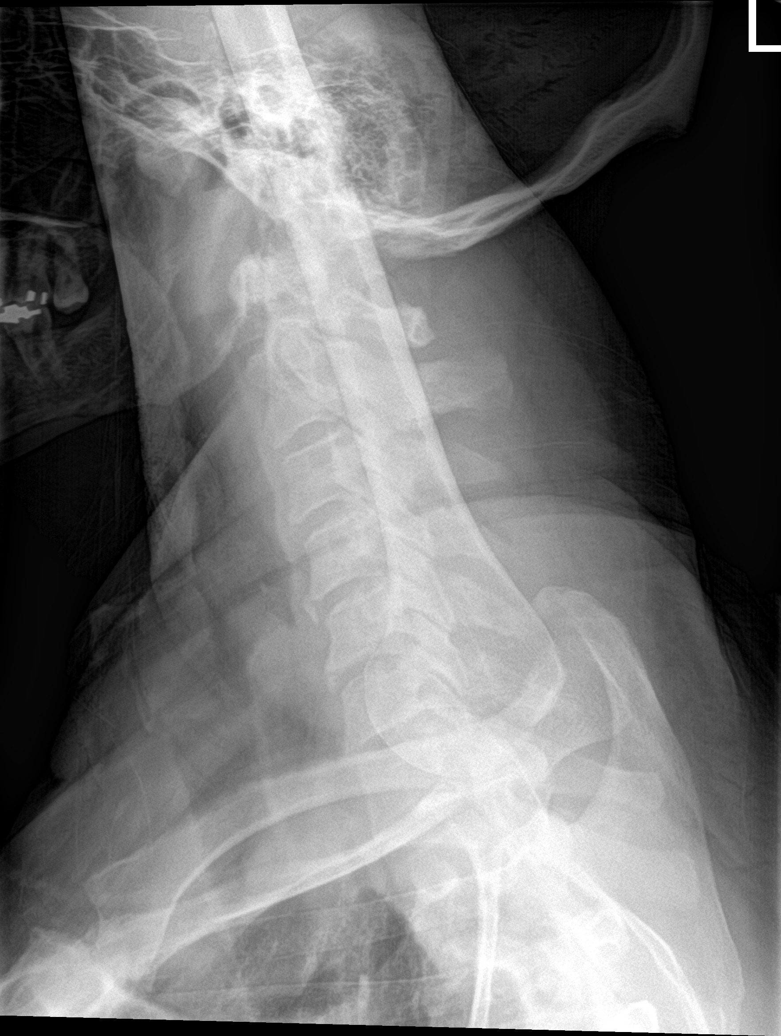

[6 of 6 positions shown; findings below may reference images not displayed]

FINDINGS: Alignment is normal. No disc space narrowing. No osteophytic
encroachment upon the canal or foramina. No facet arthropathy.
IMPRESSION: Negative cervical spine radiographs.

## 2017-04-09 ENCOUNTER — Ambulatory Visit (INDEPENDENT_AMBULATORY_CARE_PROVIDER_SITE_OTHER): Payer: BLUE CROSS/BLUE SHIELD | Admitting: Physician Assistant

## 2017-04-09 VITALS — BP 147/87 | HR 71 | Wt 285.0 lb

## 2017-04-09 DIAGNOSIS — E291 Testicular hypofunction: Secondary | ICD-10-CM

## 2017-04-09 MED ORDER — TESTOSTERONE CYPIONATE 200 MG/ML IM SOLN
300.0000 mg | INTRAMUSCULAR | Status: DC
  Administered 2017-04-09: 300 mg via INTRAMUSCULAR

## 2017-04-09 NOTE — Progress Notes (Signed)
Patient is here for testosterone injection. Patient denies mood or vision changes, heart palpitations, headaches.  Patient is overdue for testosterone labs. Advised to come back in one week in the early am to have labs drawn. Orders placed for testosterone, PSA, CBC.

## 2017-04-20 DIAGNOSIS — E291 Testicular hypofunction: Secondary | ICD-10-CM | POA: Diagnosis not present

## 2017-04-20 LAB — CBC
HCT: 46.6 % (ref 38.5–50.0)
HEMOGLOBIN: 15.8 g/dL (ref 13.2–17.1)
MCH: 30.3 pg (ref 27.0–33.0)
MCHC: 33.9 g/dL (ref 32.0–36.0)
MCV: 89.4 fL (ref 80.0–100.0)
MPV: 10.7 fL (ref 7.5–12.5)
PLATELETS: 211 10*3/uL (ref 140–400)
RBC: 5.21 MIL/uL (ref 4.20–5.80)
RDW: 13.9 % (ref 11.0–15.0)
WBC: 6.9 10*3/uL (ref 3.8–10.8)

## 2017-04-21 LAB — TESTOSTERONE: TESTOSTERONE: 485 ng/dL (ref 250–827)

## 2017-04-21 LAB — PSA: PSA: 0.9 ng/mL (ref <=4.0)

## 2017-04-23 ENCOUNTER — Encounter: Payer: Self-pay | Admitting: Physician Assistant

## 2017-04-23 ENCOUNTER — Ambulatory Visit (INDEPENDENT_AMBULATORY_CARE_PROVIDER_SITE_OTHER): Payer: BLUE CROSS/BLUE SHIELD | Admitting: Physician Assistant

## 2017-04-23 VITALS — BP 128/84 | HR 81 | Ht 71.0 in | Wt 281.0 lb

## 2017-04-23 DIAGNOSIS — E291 Testicular hypofunction: Secondary | ICD-10-CM | POA: Diagnosis not present

## 2017-04-23 DIAGNOSIS — I10 Essential (primary) hypertension: Secondary | ICD-10-CM | POA: Diagnosis not present

## 2017-04-23 DIAGNOSIS — M1000 Idiopathic gout, unspecified site: Secondary | ICD-10-CM | POA: Diagnosis not present

## 2017-04-23 DIAGNOSIS — M62838 Other muscle spasm: Secondary | ICD-10-CM

## 2017-04-23 DIAGNOSIS — K2100 Gastro-esophageal reflux disease with esophagitis, without bleeding: Secondary | ICD-10-CM

## 2017-04-23 DIAGNOSIS — M170 Bilateral primary osteoarthritis of knee: Secondary | ICD-10-CM

## 2017-04-23 DIAGNOSIS — R609 Edema, unspecified: Secondary | ICD-10-CM

## 2017-04-23 DIAGNOSIS — E8881 Metabolic syndrome: Secondary | ICD-10-CM | POA: Diagnosis not present

## 2017-04-23 DIAGNOSIS — F3340 Major depressive disorder, recurrent, in remission, unspecified: Secondary | ICD-10-CM

## 2017-04-23 DIAGNOSIS — E119 Type 2 diabetes mellitus without complications: Secondary | ICD-10-CM | POA: Diagnosis not present

## 2017-04-23 DIAGNOSIS — N400 Enlarged prostate without lower urinary tract symptoms: Secondary | ICD-10-CM

## 2017-04-23 LAB — POCT GLYCOSYLATED HEMOGLOBIN (HGB A1C): Hemoglobin A1C: 6.5

## 2017-04-23 MED ORDER — TESTOSTERONE CYPIONATE 200 MG/ML IM SOLN
300.0000 mg | Freq: Once | INTRAMUSCULAR | Status: AC
  Administered 2017-04-23: 300 mg via INTRAMUSCULAR

## 2017-04-23 MED ORDER — ESOMEPRAZOLE MAGNESIUM 40 MG PO CPDR: 40.0000 mg | DELAYED_RELEASE_CAPSULE | Freq: Every day | ORAL | 5 refills | Status: DC

## 2017-04-23 MED ORDER — FUROSEMIDE 20 MG PO TABS: ORAL_TABLET | ORAL | 3 refills | Status: DC

## 2017-04-23 MED ORDER — MELOXICAM 15 MG PO TABS: ORAL_TABLET | ORAL | 5 refills | Status: DC

## 2017-04-23 MED ORDER — CARVEDILOL 25 MG PO TABS: 25.0000 mg | ORAL_TABLET | Freq: Two times a day (BID) | ORAL | 5 refills | Status: DC

## 2017-04-23 MED ORDER — NIACIN ER (ANTIHYPERLIPIDEMIC) 500 MG PO TBCR: 500.0000 mg | EXTENDED_RELEASE_TABLET | Freq: Every day | ORAL | 5 refills | Status: DC

## 2017-04-23 MED ORDER — ATORVASTATIN CALCIUM 40 MG PO TABS: 40.0000 mg | ORAL_TABLET | Freq: Every day | ORAL | 5 refills | Status: DC

## 2017-04-23 MED ORDER — TAMSULOSIN HCL 0.4 MG PO CAPS: 0.4000 mg | ORAL_CAPSULE | Freq: Every day | ORAL | 5 refills | Status: DC

## 2017-04-23 MED ORDER — METFORMIN HCL 1000 MG PO TABS: 1000.0000 mg | ORAL_TABLET | Freq: Two times a day (BID) | ORAL | 2 refills | Status: DC

## 2017-04-23 MED ORDER — FLUOXETINE HCL 40 MG PO CAPS: 40.0000 mg | ORAL_CAPSULE | Freq: Every day | ORAL | 5 refills | Status: DC

## 2017-04-23 MED ORDER — AMLODIPINE BESYLATE 10 MG PO TABS: 10.0000 mg | ORAL_TABLET | Freq: Every day | ORAL | 5 refills | Status: DC

## 2017-04-23 MED ORDER — GABAPENTIN 300 MG PO CAPS: 300.0000 mg | ORAL_CAPSULE | Freq: Every day | ORAL | 3 refills | Status: DC

## 2017-04-23 MED ORDER — LISINOPRIL-HYDROCHLOROTHIAZIDE 20-25 MG PO TABS: 1.0000 | ORAL_TABLET | Freq: Every day | ORAL | 5 refills | Status: DC

## 2017-04-23 MED ORDER — ALLOPURINOL 100 MG PO TABS: 100.0000 mg | ORAL_TABLET | Freq: Every day | ORAL | 5 refills | Status: DC

## 2017-04-23 MED ORDER — RANITIDINE HCL 300 MG PO TABS: 300.0000 mg | ORAL_TABLET | Freq: Two times a day (BID) | ORAL | 1 refills | Status: DC

## 2017-04-23 NOTE — Progress Notes (Signed)
Subjective:    Patient ID: Brandon Mckee, male    DOB: 1966-01-26, 51 y.o.   MRN: [id number]  HPI Pt is a 51 yo male who presents to the clinic for 3 month DM check. He denies checking sugar. He continues to keep sweets limited. He is not exercising but he does work in yard and help with some outside work for wid[employer]. Denies any open leg sores or wounds. No hypoglycemic events. He admits he has not taken xigduo in 3 months because of cost.   He does need other refills but denies any concerns or problems. No CP, palpitations, headaches, dizziness, SOB, wheezing.    Review of Systems  All other systems reviewed and are negative.      Objective:   Physical Exam  Constitutional: He is oriented to person, place, and time. He appears well-developed and well-nourished.  Obese.   HENT:  Head: Normocephalic and atraumatic.  Neck: Normal range of motion. Neck supple.  Cardiovascular: Normal rate, regular rhythm and normal heart sounds.   Pulmonary/Chest: Effort normal and breath sounds normal.  Lymphadenopathy:    He has no cervical adenopathy.  Neurological: He is alert and oriented to person, place, and time.  Psychiatric: He has a normal mood and affect. His behavior is normal.          Assessment & Plan:  [name] KitchenMarland KitchenJessee was seen today for diabetes and hypogonadism.  Diagnoses and all orders for this visit:  Hypogonadism in male -     testosterone cypionate (DEPOTESTOSTERONE CYPIONATE) injection 300 mg; Inject 1.5 mLs (300 mg total) into the muscle once.  Essential hypertension -     amLODipine (NORVASC) 10 MG tablet; Take 1 tablet (10 mg total) by mouth daily. -     carvedilol (COREG) 25 MG tablet; Take 1 tablet (25 mg total) by mouth 2 (two) times daily with a meal. -     lisinopril-hydrochlorothiazide (PRINZIDE,ZESTORETIC) 20-25 MG tablet; Take 1 tablet by mouth daily.  DYSMETABOLIC SYNDROME -     niacin (NIASPAN) 500 MG CR tablet; Take 1 tablet (500 mg total) by mouth  at bedtime. -     atorvastatin (LIPITOR) 40 MG tablet; Take 1 tablet (40 mg total) by mouth daily.  Controlled type 2 diabetes mellitus without complication, without long-term current use of insulin (HCC) -     metFORMIN (GLUCOPHAGE) 1000 MG tablet; Take 1 tablet (1,000 mg total) by mouth 2 (two) times daily with a meal. -     POCT HgB A1C  Recurrent major depressive disorder, in remission (HCC) -     FLUoxetine (PROZAC) 40 MG capsule; Take 1 capsule (40 mg total) by mouth daily.  Edema, unspecified type -     furosemide (LASIX) 20 MG tablet; TAKE 1 TABLET BY MOUTH EVERY DAY AS NEEDED FOR LEG SWELLING  Trapezius muscle spasm -     gabapentin (NEURONTIN) 300 MG capsule; Take 1 capsule (300 mg total) by mouth at bedtime. -     meloxicam (MOBIC) 15 MG tablet; TAKE 1 TABLET BY MOUTH IN THE MORNING WITH BREAKFAST FOR 2 WEEKS THEN 1 TAB DAILY AS NEEDED FOR PAIN  Gastroesophageal reflux disease with esophagitis -     ranitidine (ZANTAC) 300 MG tablet; Take 1 tablet (300 mg total) by mouth 2 (two) times daily.  Benign prostatic hyperplasia without lower urinary tract symptoms -     tamsulosin (FLOMAX) 0.4 MG CAPS capsule; Take 1 capsule (0.4 mg total) by mouth daily after supper.  Primary osteoarthritis of both knees -     meloxicam (MOBIC) 15 MG tablet; TAKE 1 TABLET BY MOUTH IN THE MORNING WITH BREAKFAST FOR 2 WEEKS THEN 1 TAB DAILY AS NEEDED FOR PAIN  Idiopathic gout, unspecified chronicity, unspecified site -     allopurinol (ZYLOPRIM) 100 MG tablet; Take 1 tablet (100 mg total) by mouth daily.  Other orders -     esomeprazole (NEXIUM) 40 MG capsule; Take 1 capsule (40 mg total) by mouth daily.  .. Lab Results  Component Value Date   HGBA1C 6.5 04/23/2017   a1c still great. Will send over regular metformin since xigduo still too expensive.  Follow up in 3 months.

## 2017-04-25 DIAGNOSIS — N400 Enlarged prostate without lower urinary tract symptoms: Secondary | ICD-10-CM | POA: Insufficient documentation

## 2017-04-27 ENCOUNTER — Other Ambulatory Visit: Payer: Self-pay | Admitting: *Deleted

## 2017-04-27 DIAGNOSIS — E8881 Metabolic syndrome: Secondary | ICD-10-CM

## 2017-04-27 MED ORDER — NIACIN ER (ANTIHYPERLIPIDEMIC) 500 MG PO TBCR: 500.0000 mg | EXTENDED_RELEASE_TABLET | Freq: Every day | ORAL | 1 refills | Status: DC

## 2017-05-07 ENCOUNTER — Ambulatory Visit (INDEPENDENT_AMBULATORY_CARE_PROVIDER_SITE_OTHER): Payer: BLUE CROSS/BLUE SHIELD | Admitting: Physician Assistant

## 2017-05-07 VITALS — BP 140/81 | HR 70

## 2017-05-07 DIAGNOSIS — E291 Testicular hypofunction: Secondary | ICD-10-CM

## 2017-05-07 MED ORDER — TESTOSTERONE CYPIONATE 200 MG/ML IM SOLN
300.0000 mg | Freq: Once | INTRAMUSCULAR | Status: AC
  Administered 2017-05-07: 300 mg via INTRAMUSCULAR

## 2017-05-07 NOTE — Progress Notes (Signed)
Patient came into office today for testosterone injection. Denies chest pain, shortness of breath, headaches and problems associated with taking this medication. Patient states he has had no abnornal mood swings. Patient tolerated injection in RUOQ well without complications. Patient advised to schedule his next injection for 2 weeks from today.  Agree with above plan. Jade Breeback PA-C 

## 2017-05-17 ENCOUNTER — Encounter: Payer: Self-pay | Admitting: Physician Assistant

## 2017-05-21 ENCOUNTER — Ambulatory Visit (INDEPENDENT_AMBULATORY_CARE_PROVIDER_SITE_OTHER): Payer: BLUE CROSS/BLUE SHIELD | Admitting: Physician Assistant

## 2017-05-21 VITALS — BP 135/86 | HR 83

## 2017-05-21 DIAGNOSIS — E291 Testicular hypofunction: Secondary | ICD-10-CM | POA: Diagnosis not present

## 2017-05-21 MED ORDER — TESTOSTERONE CYPIONATE 200 MG/ML IM SOLN
300.0000 mg | Freq: Once | INTRAMUSCULAR | Status: AC
  Administered 2017-05-21: 300 mg via INTRAMUSCULAR

## 2017-05-21 NOTE — Progress Notes (Signed)
Patient came into office today for testosterone injection. Denies chest pain, shortness of breath, headaches and problems associated with taking this medication. Patient states he has had no abnornal mood swings. Patient tolerated injection in LUOQ well without complications. Patient advised to schedule his next injection for 2 weeks from today.  Agree with above plan. Jade Breeback PA-C 

## 2017-05-24 ENCOUNTER — Other Ambulatory Visit: Payer: Self-pay | Admitting: Physician Assistant

## 2017-05-24 DIAGNOSIS — M170 Bilateral primary osteoarthritis of knee: Secondary | ICD-10-CM

## 2017-05-24 DIAGNOSIS — F3340 Major depressive disorder, recurrent, in remission, unspecified: Secondary | ICD-10-CM

## 2017-05-26 ENCOUNTER — Other Ambulatory Visit: Payer: Self-pay | Admitting: Physician Assistant

## 2017-05-26 DIAGNOSIS — K2100 Gastro-esophageal reflux disease with esophagitis, without bleeding: Secondary | ICD-10-CM

## 2017-06-04 ENCOUNTER — Ambulatory Visit (INDEPENDENT_AMBULATORY_CARE_PROVIDER_SITE_OTHER): Payer: BLUE CROSS/BLUE SHIELD | Admitting: Physician Assistant

## 2017-06-04 VITALS — BP 126/70 | HR 76

## 2017-06-04 DIAGNOSIS — E291 Testicular hypofunction: Secondary | ICD-10-CM | POA: Diagnosis not present

## 2017-06-04 MED ORDER — TESTOSTERONE CYPIONATE 200 MG/ML IM SOLN
300.0000 mg | Freq: Once | INTRAMUSCULAR | Status: AC
  Administered 2017-06-04: 300 mg via INTRAMUSCULAR

## 2017-06-04 NOTE — Progress Notes (Signed)
Patient came into office today for testosterone injection. Denies chest pain, shortness of breath, headaches and problems associated with taking this medication. Patient states he has had no abnornal mood swings. Patient tolerated injection in RUOQ well without complications. Patient advised to schedule his next injection for 2 weeks from today.  Agree with above plan. Iran Planas PA-c

## 2017-06-18 ENCOUNTER — Ambulatory Visit (INDEPENDENT_AMBULATORY_CARE_PROVIDER_SITE_OTHER): Payer: BLUE CROSS/BLUE SHIELD | Admitting: Osteopathic Medicine

## 2017-06-18 VITALS — BP 129/72 | HR 79 | Wt 281.0 lb

## 2017-06-18 DIAGNOSIS — E291 Testicular hypofunction: Secondary | ICD-10-CM | POA: Diagnosis not present

## 2017-06-18 MED ORDER — TESTOSTERONE CYPIONATE 200 MG/ML IM SOLN
300.0000 mg | Freq: Once | INTRAMUSCULAR | Status: AC
  Administered 2017-06-18: 300 mg via INTRAMUSCULAR

## 2017-06-18 NOTE — Progress Notes (Signed)
Pt here for testosterone injection no SOB,CP or mood swings. Injection tolerated well given in LUOQ. Pt will return to clinic in 2 weeks for next injection.Audelia Hives [place]

## 2017-06-21 ENCOUNTER — Other Ambulatory Visit: Payer: Self-pay | Admitting: Physician Assistant

## 2017-06-21 DIAGNOSIS — M170 Bilateral primary osteoarthritis of knee: Secondary | ICD-10-CM

## 2017-06-21 DIAGNOSIS — F3340 Major depressive disorder, recurrent, in remission, unspecified: Secondary | ICD-10-CM

## 2017-07-02 ENCOUNTER — Ambulatory Visit (INDEPENDENT_AMBULATORY_CARE_PROVIDER_SITE_OTHER): Payer: BLUE CROSS/BLUE SHIELD | Admitting: Family Medicine

## 2017-07-02 VITALS — BP 130/73 | HR 69 | Wt 275.0 lb

## 2017-07-02 DIAGNOSIS — E291 Testicular hypofunction: Secondary | ICD-10-CM | POA: Diagnosis not present

## 2017-07-02 MED ORDER — TESTOSTERONE CYPIONATE 200 MG/ML IM SOLN
300.0000 mg | Freq: Once | INTRAMUSCULAR | Status: AC
  Administered 2017-07-02: 300 mg via INTRAMUSCULAR

## 2017-07-02 NOTE — Progress Notes (Signed)
Pt here for testosterone injection no SOB,CP or mood swings. Injection tolerated well given in RUOQ. Pt to RTC 2 wks.Audelia Hives [place]

## 2017-07-16 ENCOUNTER — Ambulatory Visit (INDEPENDENT_AMBULATORY_CARE_PROVIDER_SITE_OTHER): Payer: BLUE CROSS/BLUE SHIELD | Admitting: Family Medicine

## 2017-07-16 VITALS — BP 134/77 | HR 71 | Resp 18 | Wt 285.7 lb

## 2017-07-16 DIAGNOSIS — E291 Testicular hypofunction: Secondary | ICD-10-CM

## 2017-07-16 MED ORDER — TESTOSTERONE CYPIONATE 200 MG/ML IM SOLN
300.0000 mg | Freq: Once | INTRAMUSCULAR | Status: AC
  Administered 2017-07-16: 300 mg via INTRAMUSCULAR

## 2017-07-16 NOTE — Progress Notes (Signed)
Hypogonadism-agree with above. Labs up-to-date.

## 2017-07-16 NOTE — Progress Notes (Signed)
Pt here for testosterone injection. Denies chest pain, headaches, dizziness or mood changes with medication. Pt tolerated injection into LUOQ with out complications. Advised to return in 2 weeks for next injection.

## 2017-07-23 ENCOUNTER — Ambulatory Visit: Payer: BLUE CROSS/BLUE SHIELD | Admitting: Physician Assistant

## 2017-07-28 ENCOUNTER — Other Ambulatory Visit: Payer: Self-pay | Admitting: Physician Assistant

## 2017-07-28 DIAGNOSIS — I10 Essential (primary) hypertension: Secondary | ICD-10-CM

## 2017-07-28 DIAGNOSIS — N400 Enlarged prostate without lower urinary tract symptoms: Secondary | ICD-10-CM

## 2017-07-28 DIAGNOSIS — E8881 Metabolic syndrome: Secondary | ICD-10-CM

## 2017-07-30 ENCOUNTER — Ambulatory Visit: Payer: BLUE CROSS/BLUE SHIELD

## 2017-08-09 ENCOUNTER — Ambulatory Visit (INDEPENDENT_AMBULATORY_CARE_PROVIDER_SITE_OTHER): Payer: BLUE CROSS/BLUE SHIELD | Admitting: Physician Assistant

## 2017-08-09 ENCOUNTER — Encounter: Payer: Self-pay | Admitting: Physician Assistant

## 2017-08-09 VITALS — BP 115/65 | HR 71 | Ht 71.0 in | Wt 275.0 lb

## 2017-08-09 DIAGNOSIS — E291 Testicular hypofunction: Secondary | ICD-10-CM | POA: Diagnosis not present

## 2017-08-09 DIAGNOSIS — F4320 Adjustment disorder, unspecified: Secondary | ICD-10-CM

## 2017-08-09 DIAGNOSIS — E119 Type 2 diabetes mellitus without complications: Secondary | ICD-10-CM | POA: Diagnosis not present

## 2017-08-09 DIAGNOSIS — I10 Essential (primary) hypertension: Secondary | ICD-10-CM

## 2017-08-09 DIAGNOSIS — E669 Obesity, unspecified: Secondary | ICD-10-CM

## 2017-08-09 DIAGNOSIS — F4321 Adjustment disorder with depressed mood: Secondary | ICD-10-CM | POA: Insufficient documentation

## 2017-08-09 LAB — POCT GLYCOSYLATED HEMOGLOBIN (HGB A1C): Hemoglobin A1C: 6.8

## 2017-08-09 MED ORDER — TESTOSTERONE CYPIONATE 200 MG/ML IM SOLN
300.0000 mg | INTRAMUSCULAR | Status: DC
  Administered 2017-08-09: 300 mg via INTRAMUSCULAR

## 2017-08-09 MED ORDER — METFORMIN HCL 1000 MG PO TABS: 1000.0000 mg | ORAL_TABLET | Freq: Two times a day (BID) | ORAL | 2 refills | Status: DC

## 2017-08-09 MED ORDER — PHENTERMINE HCL 37.5 MG PO TABS: 37.5000 mg | ORAL_TABLET | Freq: Every day | ORAL | 0 refills | Status: DC

## 2017-08-09 NOTE — Progress Notes (Signed)
Subjective:    Patient ID: Brandon Mckee, male    DOB: [birth year], 51 y.o.   MRN: [id number]  HPI  Pt is a 51 yo obese male who presents for 3 month follow up on DM.   DM- he is not checking his sugars. He reports to still be taking metformin. He stopped xigduo due to cost. He denies any vision changes, no wounds or open sores. He has replaced desserts for peaches and cottage cheese. He is not exercise. He is starting back bowling this week.   Pt's wife died June 20, 2023. He lives with his 2 daughters. It has been a rough month but he feels like he is improving. He has started back bowling and going to the races.   Pt is wanting to lose weight. She is ready to get weight off and feel better. He is not currently exericsing. He would like medication to help.   .. Active Ambulatory Problems    Diagnosis Date Noted  . B12 DEFICIENCY 06/30/2010  . HYPERLIPIDEMIA 12/08/2006  . DYSMETABOLIC SYNDROME 63/89/3734  . OBESITY NOS 12/08/2006  . HYPERTENSION, BENIGN 11/22/2006  . Osteoarthritis of both knees 12/27/2008  . FATIGUE 09/25/2008  . LEG EDEMA 05/03/2009  . Obstructive sleep apnea 10/11/2013  . Male hypogonadism 05/25/2014  . Controlled diabetes mellitus type II without complication ([place]) 28/76/8115  . Depression 01/03/2016  . Anxiety disorder 01/03/2016  . Essential hypertension 01/03/2016  . Dyslipidemia 01/07/2016  . Vitamin D deficiency 01/07/2016  . Osteoarthritis of right acromioclavicular joint 01/17/2016  . Microalbuminuria 04/01/2016  . Hypogonadism in male 07/03/2016  . Trapezius muscle spasm 11/24/2016  . Colon polyps 01/01/2017  . Diverticulosis of colon without hemorrhage 01/01/2017  . Benign prostatic hyperplasia without lower urinary tract symptoms 04/25/2017  . Grief reaction 08/09/2017   Resolved Ambulatory Problems    Diagnosis Date Noted  . STYE 05/03/2009  . GASTROENTERITIS 01/03/2009  . SKIN TAG 12/08/2006  . LATERAL EPICONDYLITIS, BILATERAL  01/31/2010  . Pes anserinus tendinitis or bursitis 12/31/2008  . Tinea cruris 06/06/2013  . Testicular hypofunction 11/09/2014   Past Medical History:  Diagnosis Date  . Hyperlipidemia   . Hypertension   . Metabolic syndrome   . Pes anserine bursitis      Review of Systems See HPI.     Objective:   Physical Exam  Constitutional: He is oriented to person, place, and time. He appears well-developed and well-nourished.  HENT:  Head: Normocephalic and atraumatic.  Cardiovascular: Normal rate, regular rhythm and normal heart sounds.   Pulmonary/Chest: Effort normal and breath sounds normal.  Musculoskeletal: Normal range of motion.  Neurological: He is alert and oriented to person, place, and time.  Skin: Skin is dry.  Psychiatric: He has a normal mood and affect. His behavior is normal.          Assessment & Plan:  [name] KitchenMarland KitchenDemarkis was seen today for follow-up.  Diagnoses and all orders for this visit:  Controlled type 2 diabetes mellitus without complication, without long-term current use of insulin (HCC) -     metFORMIN (GLUCOPHAGE) 1000 MG tablet; Take 1 tablet (1,000 mg total) by mouth 2 (two) times daily with a meal.  Male hypogonadism -     testosterone cypionate (DEPOTESTOSTERONE CYPIONATE) injection 300 mg; Inject 1.5 mLs (300 mg total) into the muscle every 14 (fourteen) days. -     POCT HgB A1C  Essential hypertension  Grief reaction  Obesity (BMI 30-39.9) -     phentermine (ADIPEX-P)  37.5 MG tablet; Take 1 tablet (37.5 mg total) by mouth daily before breakfast.  .. Lab Results  Component Value Date   HGBA1C 6.8 08/09/2017   A1C is great with metformin alone.  On STATIN.  On ACE.  Encouraged to get eye exam.  Follow up in 3 months.   Encouraged grief counseling through hospice.   Discussed low carb diet with 1500 calories and 80g of protein.  Exercising at least 150 minutes a week.  [employer] Fitness Pal could be a Microbiologist.  Started phentermine.  Discussed side effects. Follow up in 1 month.

## 2017-08-27 ENCOUNTER — Ambulatory Visit (INDEPENDENT_AMBULATORY_CARE_PROVIDER_SITE_OTHER): Payer: BLUE CROSS/BLUE SHIELD | Admitting: Physician Assistant

## 2017-08-27 VITALS — BP 156/92 | HR 55 | Temp 98.0°F | Wt 264.0 lb

## 2017-08-27 DIAGNOSIS — E291 Testicular hypofunction: Secondary | ICD-10-CM | POA: Diagnosis not present

## 2017-08-27 DIAGNOSIS — Z23 Encounter for immunization: Secondary | ICD-10-CM

## 2017-08-27 MED ORDER — TESTOSTERONE CYPIONATE 200 MG/ML IM SOLN
300.0000 mg | Freq: Once | INTRAMUSCULAR | Status: AC
  Administered 2017-08-27: 300 mg via INTRAMUSCULAR

## 2017-08-27 NOTE — Progress Notes (Signed)
Patient came into office today for testosterone injection. Denies chest pain, shortness of breath, headaches and problems associated with taking this medication. Patient states he has had no abnornal mood swings. Patient tolerated injection in [place] well without complications. Patient advised to schedule his next injection for 2 weeks from today. Pt's BP was elevated today, went over readings with PCP. Pt states he did miss two days of medicine this week, but took them this morning. Per PCP, Pt to check BP at home and take his Rx's daily. When Pt comes back in 2 weeks for next injection, if BP is still high, Rx's will be adjusted. Verbalized understanding.   Patient came into clinic today for flu vaccination. Patient was given a flu shot questionnaire prior to administration of immunization. All questions were answered no. Patient tolerated injection of flu immunization in right deltoid well, with no immediate complications. An information pamphlet regarding flu shot immunization and frequently asked questions was given to Patient prior to leaving clinic. Advised to contact our office with any questions/concerns.   Agree with above plan. Iran Planas PA-C

## 2017-08-31 ENCOUNTER — Other Ambulatory Visit: Payer: Self-pay | Admitting: Sports Medicine

## 2017-08-31 ENCOUNTER — Other Ambulatory Visit: Payer: Self-pay | Admitting: Physician Assistant

## 2017-08-31 DIAGNOSIS — M62838 Other muscle spasm: Secondary | ICD-10-CM

## 2017-08-31 DIAGNOSIS — I10 Essential (primary) hypertension: Secondary | ICD-10-CM

## 2017-09-10 ENCOUNTER — Ambulatory Visit (INDEPENDENT_AMBULATORY_CARE_PROVIDER_SITE_OTHER): Payer: BLUE CROSS/BLUE SHIELD | Admitting: Physician Assistant

## 2017-09-10 VITALS — BP 139/71 | HR 70 | Wt 263.0 lb

## 2017-09-10 DIAGNOSIS — I10 Essential (primary) hypertension: Secondary | ICD-10-CM

## 2017-09-10 DIAGNOSIS — F4320 Adjustment disorder, unspecified: Secondary | ICD-10-CM

## 2017-09-10 DIAGNOSIS — F3340 Major depressive disorder, recurrent, in remission, unspecified: Secondary | ICD-10-CM

## 2017-09-10 DIAGNOSIS — E291 Testicular hypofunction: Secondary | ICD-10-CM | POA: Diagnosis not present

## 2017-09-10 DIAGNOSIS — E669 Obesity, unspecified: Secondary | ICD-10-CM | POA: Insufficient documentation

## 2017-09-10 DIAGNOSIS — F4321 Adjustment disorder with depressed mood: Secondary | ICD-10-CM

## 2017-09-10 MED ORDER — PHENTERMINE HCL 37.5 MG PO TABS: 37.5000 mg | ORAL_TABLET | Freq: Every day | ORAL | 0 refills | Status: DC

## 2017-09-10 MED ORDER — TESTOSTERONE CYPIONATE 200 MG/ML IM SOLN
300.0000 mg | Freq: Once | INTRAMUSCULAR | Status: AC
  Administered 2017-09-10: 300 mg via INTRAMUSCULAR

## 2017-09-10 NOTE — Progress Notes (Signed)
Subjective:    Patient ID: Brandon Mckee, male    DOB: 03-29-1966, 51 y.o.   MRN: [id number]  HPI  Pt is a 51 yo male who presents to the clinic for follow up. He has not been taking phentermine for weight loss but has lost another pound. He would like another month of medication. He denies any side effects. At his highest he has been 288 and today 263. He has cut evening dessert out and trying to stay more active.   He is doing better after the loss of his wife this summer. He does have a "lady friend" who he does a lot with. She wants him to start walking every day. He is also bowling a few times a week. He is not taking prozac and feels like he does not need it. He still admits to moments where he thinks of his wife and cries. No SI/HC.   [name]. Active Ambulatory Problems    Diagnosis Date Noted  . B12 DEFICIENCY 06/30/2010  . HYPERLIPIDEMIA 12/08/2006  . DYSMETABOLIC SYNDROME 73/41/9379  . OBESITY NOS 12/08/2006  . HYPERTENSION, BENIGN 11/22/2006  . Osteoarthritis of both knees 12/27/2008  . FATIGUE 09/25/2008  . LEG EDEMA 05/03/2009  . Obstructive sleep apnea 10/11/2013  . Male hypogonadism 05/25/2014  . Controlled diabetes mellitus type II without complication ([place]) 02/40/9735  . Depression 01/03/2016  . Anxiety disorder 01/03/2016  . Dyslipidemia 01/07/2016  . Vitamin D deficiency 01/07/2016  . Osteoarthritis of right acromioclavicular joint 01/17/2016  . Microalbuminuria 04/01/2016  . Hypogonadism in male 07/03/2016  . Trapezius muscle spasm 11/24/2016  . Colon polyps 01/01/2017  . Diverticulosis of colon without hemorrhage 01/01/2017  . Benign prostatic hyperplasia without lower urinary tract symptoms 04/25/2017  . Grief reaction 08/09/2017  . Obesity (BMI 30-39.9) 09/10/2017   Resolved Ambulatory Problems    Diagnosis Date Noted  . STYE 05/03/2009  . GASTROENTERITIS 01/03/2009  . SKIN TAG 12/08/2006  . LATERAL EPICONDYLITIS, BILATERAL 01/31/2010  . Pes  anserinus tendinitis or bursitis 12/31/2008  . Tinea cruris 06/06/2013  . Testicular hypofunction 11/09/2014  . Essential hypertension 01/03/2016   Past Medical History:  Diagnosis Date  . Hyperlipidemia   . Hypertension   . Metabolic syndrome   . Pes anserine bursitis      Review of Systems  All other systems reviewed and are negative.      Objective:   Physical Exam  Constitutional: He is oriented to person, place, and time. He appears well-developed and well-nourished.  Cardiovascular: Normal rate, regular rhythm and normal heart sounds.   Pulmonary/Chest: Effort normal and breath sounds normal.  Neurological: He is alert and oriented to person, place, and time.  Psychiatric: He has a normal mood and affect. His behavior is normal.          Assessment & Plan:  [name] KitchenMarland KitchenKye was seen today for weight check and testosterone injection.  Diagnoses and all orders for this visit:  Male hypogonadism -     testosterone cypionate (DEPOTESTOSTERONE CYPIONATE) injection 300 mg; Inject 1.5 mLs (300 mg total) into the muscle once.  HYPERTENSION, BENIGN  Obesity (BMI 30-39.9) -     phentermine (ADIPEX-P) 37.5 MG tablet; Take 1 tablet (37.5 mg total) by mouth daily before breakfast.  Grief reaction  Recurrent major depressive disorder, in remission ([place])   .Discussed low carb diet with 1500 calories and 80g of protein.  Exercising at least 150 minutes a week.  My Fitness Pal could be a Microbiologist.  Continue phentermine follow up in 1 month.   Ok to stop prozac. Discussed signs and symptoms he might need to go back on it.   .. Depression screen PHQ 2/9 08/27/2017  Decreased Interest 1  Down, Depressed, Hopeless 1  PHQ - 2 Score 2

## 2017-09-11 ENCOUNTER — Encounter: Payer: Self-pay | Admitting: Physician Assistant

## 2017-09-24 ENCOUNTER — Ambulatory Visit (INDEPENDENT_AMBULATORY_CARE_PROVIDER_SITE_OTHER): Payer: BLUE CROSS/BLUE SHIELD | Admitting: Physician Assistant

## 2017-09-24 VITALS — BP 139/83 | HR 62 | Wt 263.0 lb

## 2017-09-24 DIAGNOSIS — E291 Testicular hypofunction: Secondary | ICD-10-CM

## 2017-09-24 MED ORDER — TESTOSTERONE CYPIONATE 200 MG/ML IM SOLN
300.0000 mg | Freq: Once | INTRAMUSCULAR | Status: AC
  Administered 2017-09-24: 300 mg via INTRAMUSCULAR

## 2017-09-24 NOTE — Progress Notes (Signed)
Patient came into office today for testosterone injection. Denies chest pain, shortness of breath, headaches and problems associated with taking this medication. Patient states he has had no abnornal mood swings. Patient tolerated injection in [place] well without complications. Patient advised to schedule his next injection for 2 weeks from today.  Agree with above plan. Iran Planas PA-C

## 2017-10-08 ENCOUNTER — Ambulatory Visit (INDEPENDENT_AMBULATORY_CARE_PROVIDER_SITE_OTHER): Payer: BLUE CROSS/BLUE SHIELD | Admitting: Sports Medicine

## 2017-10-08 VITALS — BP 144/80 | HR 52 | Wt 253.0 lb

## 2017-10-08 DIAGNOSIS — E291 Testicular hypofunction: Secondary | ICD-10-CM | POA: Diagnosis not present

## 2017-10-08 MED ORDER — TESTOSTERONE CYPIONATE 200 MG/ML IM SOLN
200.0000 mg | Freq: Once | INTRAMUSCULAR | Status: AC
  Administered 2017-10-08: 200 mg via INTRAMUSCULAR

## 2017-10-08 NOTE — Progress Notes (Signed)
Patient is here for Testosterone injection. Patient denies headache,dizziness, mood changes, ect. Injection given in RUQ without complication. Patient will return in 2 weeks for next injection. Last labs in may 2018.

## 2017-10-22 ENCOUNTER — Ambulatory Visit (INDEPENDENT_AMBULATORY_CARE_PROVIDER_SITE_OTHER): Payer: BLUE CROSS/BLUE SHIELD | Admitting: Physician Assistant

## 2017-10-22 VITALS — BP 128/72 | HR 73

## 2017-10-22 DIAGNOSIS — E291 Testicular hypofunction: Secondary | ICD-10-CM

## 2017-10-22 MED ORDER — TESTOSTERONE CYPIONATE 200 MG/ML IM SOLN
300.0000 mg | Freq: Once | INTRAMUSCULAR | Status: AC
  Administered 2017-10-22: 300 mg via INTRAMUSCULAR

## 2017-10-22 NOTE — Progress Notes (Signed)
   Subjective:    Patient ID: Brandon Mckee, male    DOB: 1966-12-09, 51 y.o.   MRN: [id number]  HPI  [name] is here for a testosterone injection. Denies chest pain, shortness of breath, headaches or mood changes.     Review of Systems     Objective:   Physical Exam        Assessment & Plan:  Patient tolerated injection well without complications. Patient advised to schedule next injection 14 days from today. He has been schedule for a follow up with Jade in 2 weeks. Patient advised to keep the scheduled appointment.   Pt needs labs at next visit. Iran Planas PA-C

## 2017-11-05 ENCOUNTER — Encounter: Payer: Self-pay | Admitting: Physician Assistant

## 2017-11-05 ENCOUNTER — Ambulatory Visit: Payer: BLUE CROSS/BLUE SHIELD | Admitting: Physician Assistant

## 2017-11-05 VITALS — BP 129/75 | HR 65 | Ht 70.98 in | Wt 245.0 lb

## 2017-11-05 DIAGNOSIS — E785 Hyperlipidemia, unspecified: Secondary | ICD-10-CM | POA: Diagnosis not present

## 2017-11-05 DIAGNOSIS — E119 Type 2 diabetes mellitus without complications: Secondary | ICD-10-CM

## 2017-11-05 DIAGNOSIS — I10 Essential (primary) hypertension: Secondary | ICD-10-CM

## 2017-11-05 DIAGNOSIS — E8881 Metabolic syndrome: Secondary | ICD-10-CM

## 2017-11-05 DIAGNOSIS — R7303 Prediabetes: Secondary | ICD-10-CM

## 2017-11-05 DIAGNOSIS — E291 Testicular hypofunction: Secondary | ICD-10-CM

## 2017-11-05 DIAGNOSIS — Z79899 Other long term (current) drug therapy: Secondary | ICD-10-CM

## 2017-11-05 LAB — POCT GLYCOSYLATED HEMOGLOBIN (HGB A1C): HEMOGLOBIN A1C: 5.7

## 2017-11-05 MED ORDER — TESTOSTERONE CYPIONATE 200 MG/ML IM SOLN
300.0000 mg | Freq: Once | INTRAMUSCULAR | Status: AC
  Administered 2017-11-05: 300 mg via INTRAMUSCULAR

## 2017-11-05 NOTE — Progress Notes (Signed)
Subjective:    Patient ID: Brandon Mckee, male    DOB: 1966-07-14, 51 y.o.   MRN: [id number]  HPI Pt is a 51 yo pleasant male with hypogonadism, DM, HTN, hyperlipidemia who presents to the clinic for follow up.   DM- doing well lost almost 40lbs with healthy eating. He did start out on phentermine but continues to lose on his on.  He staying a lot more active. He denies any hypoglycemia. No open sores or wounds. He stopped taking metformin 3 months ago.  Not checking sugars.   HTN- taking BP meds daily. No CP, palpitations, headaches, vision changes.  Here for testosterone shot.   .. Active Ambulatory Problems    Diagnosis Date Noted  . B12 DEFICIENCY 06/30/2010  . HYPERLIPIDEMIA 12/08/2006  . DYSMETABOLIC SYNDROME 12/87/8676  . OBESITY NOS 12/08/2006  . HYPERTENSION, BENIGN 11/22/2006  . Osteoarthritis of both knees 12/27/2008  . FATIGUE 09/25/2008  . LEG EDEMA 05/03/2009  . Obstructive sleep apnea 10/11/2013  . Male hypogonadism 05/25/2014  . Pre-diabetes 12/07/2014  . Depression 01/03/2016  . Anxiety disorder 01/03/2016  . Dyslipidemia 01/07/2016  . Vitamin D deficiency 01/07/2016  . Osteoarthritis of right acromioclavicular joint 01/17/2016  . Microalbuminuria 04/01/2016  . Hypogonadism in male 07/03/2016  . Trapezius muscle spasm 11/24/2016  . Colon polyps 01/01/2017  . Diverticulosis of colon without hemorrhage 01/01/2017  . Benign prostatic hyperplasia without lower urinary tract symptoms 04/25/2017  . Grief reaction 08/09/2017  . Obesity (BMI 30-39.9) 09/10/2017   Resolved Ambulatory Problems    Diagnosis Date Noted  . STYE 05/03/2009  . GASTROENTERITIS 01/03/2009  . SKIN TAG 12/08/2006  . LATERAL EPICONDYLITIS, BILATERAL 01/31/2010  . Pes anserinus tendinitis or bursitis 12/31/2008  . Tinea cruris 06/06/2013  . Testicular hypofunction 11/09/2014  . Essential hypertension 01/03/2016   Past Medical History:  Diagnosis Date  . Hyperlipidemia   .  Hypertension   . Metabolic syndrome   . Pes anserine bursitis       Review of Systems  All other systems reviewed and are negative.      Objective:   Physical Exam  Constitutional: He is oriented to person, place, and time. He appears well-developed and well-nourished.  HENT:  Head: Normocephalic and atraumatic.  Neck: Normal range of motion. Neck supple. No thyromegaly present.  Cardiovascular: Normal rate, regular rhythm and normal heart sounds.  Pulmonary/Chest: Effort normal and breath sounds normal. He has no wheezes.  Lymphadenopathy:    He has no cervical adenopathy.  Neurological: He is alert and oriented to person, place, and time.  Psychiatric: He has a normal mood and affect. His behavior is normal.          Assessment & Plan:  [name] KitchenMarland KitchenLawton was seen today for diabetes.  Diagnoses and all orders for this visit:  Male hypogonadism -     Testosterone -     CBC -     PSA -     COMPLETE METABOLIC PANEL WITH GFR -     testosterone cypionate (DEPOTESTOSTERONE CYPIONATE) injection 300 mg  Medication management -     Testosterone -     CBC -     PSA -     COMPLETE METABOLIC PANEL WITH GFR  HYPERTENSION, BENIGN  Hypogonadism in male  DYSMETABOLIC SYNDROME  Dyslipidemia  Controlled type 2 diabetes mellitus without complication, without long-term current use of insulin (HCC) -     POCT HgB A1C  Pre-diabetes   .[name] Lab Results  Component  Value Date   HGBA1C 5.7 11/05/2017   Pt is doing great.  On ASA/STATIN/ACE BP controlled.  Will call and get eye exam done at [facility] this year.   Flu and pneumonia shot done.  Follow up in 3 months.  Changed status to pre-diabetes.   Labs ordered to check testosterone level.

## 2017-11-19 ENCOUNTER — Ambulatory Visit (INDEPENDENT_AMBULATORY_CARE_PROVIDER_SITE_OTHER): Payer: BLUE CROSS/BLUE SHIELD | Admitting: Sports Medicine

## 2017-11-19 VITALS — BP 136/90 | HR 63

## 2017-11-19 DIAGNOSIS — E291 Testicular hypofunction: Secondary | ICD-10-CM | POA: Diagnosis not present

## 2017-11-19 MED ORDER — TESTOSTERONE CYPIONATE 200 MG/ML IM SOLN
300.0000 mg | Freq: Once | INTRAMUSCULAR | Status: AC
  Administered 2017-11-19: 300 mg via INTRAMUSCULAR

## 2017-11-19 NOTE — Progress Notes (Signed)
   Subjective:    Patient ID: Brandon Mckee, male    DOB: 03-05-1966, 51 y.o.   MRN: [id number]  HPI  Brandon Mckee is here for a testosterone injection. Denies chest pain, shortness of breath, headaches or mood changes.   Review of Systems     Objective:   Physical Exam        Assessment & Plan:  Hypogonadism - Patient tolerated injection well without complications. Patient advised to schedule next injection 14 days from today.

## 2017-12-03 ENCOUNTER — Ambulatory Visit (INDEPENDENT_AMBULATORY_CARE_PROVIDER_SITE_OTHER): Payer: BLUE CROSS/BLUE SHIELD | Admitting: Physician Assistant

## 2017-12-03 VITALS — BP 134/81 | HR 61 | Wt 246.0 lb

## 2017-12-03 DIAGNOSIS — E291 Testicular hypofunction: Secondary | ICD-10-CM

## 2017-12-03 MED ORDER — TESTOSTERONE CYPIONATE 200 MG/ML IM SOLN
300.0000 mg | Freq: Once | INTRAMUSCULAR | Status: AC
  Administered 2017-12-03: 300 mg via INTRAMUSCULAR

## 2017-12-03 NOTE — Progress Notes (Signed)
Patient is here for testosterone injection. Patient denies blurred vision, headaches, mood changes ect

## 2017-12-17 ENCOUNTER — Ambulatory Visit (INDEPENDENT_AMBULATORY_CARE_PROVIDER_SITE_OTHER): Payer: BLUE CROSS/BLUE SHIELD | Admitting: Physician Assistant

## 2017-12-17 VITALS — BP 141/94 | HR 69 | Wt 245.0 lb

## 2017-12-17 DIAGNOSIS — E291 Testicular hypofunction: Secondary | ICD-10-CM

## 2017-12-17 MED ORDER — TESTOSTERONE CYPIONATE 200 MG/ML IM SOLN
300.0000 mg | Freq: Once | INTRAMUSCULAR | Status: AC
  Administered 2017-12-17: 300 mg via INTRAMUSCULAR

## 2017-12-17 NOTE — Progress Notes (Signed)
Pt tolerated testosterone injection well. folllow up in 2 weeks.

## 2017-12-31 ENCOUNTER — Ambulatory Visit (INDEPENDENT_AMBULATORY_CARE_PROVIDER_SITE_OTHER): Payer: BLUE CROSS/BLUE SHIELD | Admitting: Physician Assistant

## 2017-12-31 VITALS — BP 129/74 | HR 54 | Resp 16 | Wt 249.0 lb

## 2017-12-31 DIAGNOSIS — E291 Testicular hypofunction: Secondary | ICD-10-CM

## 2017-12-31 MED ORDER — TESTOSTERONE CYPIONATE 200 MG/ML IM SOLN
300.0000 mg | INTRAMUSCULAR | Status: DC
  Administered 2017-12-31: 300 mg via INTRAMUSCULAR

## 2017-12-31 NOTE — Progress Notes (Signed)
   Subjective:    Patient ID: Brandon Mckee, male    DOB: 18-Jan-1966, 52 y.o.   MRN: [id number]  HPI Patient is here for a testosterone injection. He denies chest pain, shortness of breath, headaches and problems with medication or mood changes.    Review of Systems     Objective:   Physical Exam        Assessment & Plan:  Patient tolerated injection well without complications. Patient advised to schedule next injection in 14 days.

## 2018-01-14 ENCOUNTER — Ambulatory Visit (INDEPENDENT_AMBULATORY_CARE_PROVIDER_SITE_OTHER): Payer: BLUE CROSS/BLUE SHIELD | Admitting: Physician Assistant

## 2018-01-14 VITALS — BP 121/83 | HR 64 | Temp 98.0°F | Resp 16 | Wt 246.3 lb

## 2018-01-14 DIAGNOSIS — E291 Testicular hypofunction: Secondary | ICD-10-CM

## 2018-01-14 MED ORDER — TESTOSTERONE CYPIONATE 200 MG/ML IM SOLN
300.0000 mg | Freq: Once | INTRAMUSCULAR | Status: AC
  Administered 2018-01-14: 300 mg via INTRAMUSCULAR

## 2018-01-14 MED ORDER — TESTOSTERONE CYPIONATE 200 MG/ML IM SOLN: 300.0000 mg | Freq: Once | INTRAMUSCULAR | Status: DC

## 2018-01-14 NOTE — Progress Notes (Signed)
HPI - Patient is here for a testosterone injection. Denies chest pain, shortness of breath, headaches and problems with medication or mood changes.  ? ?Assessment and Plan - Patient tolerated injection well without complications. Patient advised to schedule next injection in 14 days.   ?

## 2018-01-28 ENCOUNTER — Ambulatory Visit (INDEPENDENT_AMBULATORY_CARE_PROVIDER_SITE_OTHER): Payer: BLUE CROSS/BLUE SHIELD | Admitting: Physician Assistant

## 2018-01-28 VITALS — BP 138/84 | HR 67 | Wt 248.0 lb

## 2018-01-28 DIAGNOSIS — E291 Testicular hypofunction: Secondary | ICD-10-CM | POA: Diagnosis not present

## 2018-01-28 MED ORDER — TESTOSTERONE CYPIONATE 200 MG/ML IM SOLN
300.0000 mg | Freq: Once | INTRAMUSCULAR | Status: AC
  Administered 2018-01-28: 300 mg via INTRAMUSCULAR

## 2018-01-28 NOTE — Progress Notes (Signed)
Patient came into office today for testosterone injection. Denies chest pain, shortness of breath, headaches and problems associated with taking this medication. Patient states he has had no abnornal mood swings. Patient tolerated injection in LUOQ well without complications. Patient advised to schedule his next injection for 2 weeks from today. 

## 2018-02-04 ENCOUNTER — Ambulatory Visit: Payer: BLUE CROSS/BLUE SHIELD | Admitting: Physician Assistant

## 2018-02-11 ENCOUNTER — Ambulatory Visit: Payer: BLUE CROSS/BLUE SHIELD | Admitting: Physician Assistant

## 2018-02-11 ENCOUNTER — Ambulatory Visit (INDEPENDENT_AMBULATORY_CARE_PROVIDER_SITE_OTHER): Payer: BLUE CROSS/BLUE SHIELD

## 2018-02-11 ENCOUNTER — Encounter: Payer: Self-pay | Admitting: Physician Assistant

## 2018-02-11 VITALS — BP 130/86 | HR 64 | Ht 71.0 in | Wt 252.0 lb

## 2018-02-11 DIAGNOSIS — E291 Testicular hypofunction: Secondary | ICD-10-CM | POA: Diagnosis not present

## 2018-02-11 DIAGNOSIS — I10 Essential (primary) hypertension: Secondary | ICD-10-CM | POA: Diagnosis not present

## 2018-02-11 DIAGNOSIS — M25531 Pain in right wrist: Secondary | ICD-10-CM

## 2018-02-11 DIAGNOSIS — Z79899 Other long term (current) drug therapy: Secondary | ICD-10-CM | POA: Diagnosis not present

## 2018-02-11 DIAGNOSIS — R7303 Prediabetes: Secondary | ICD-10-CM

## 2018-02-11 DIAGNOSIS — E8881 Metabolic syndrome: Secondary | ICD-10-CM | POA: Diagnosis not present

## 2018-02-11 LAB — POCT GLYCOSYLATED HEMOGLOBIN (HGB A1C): HEMOGLOBIN A1C: 5.6

## 2018-02-11 MED ORDER — TESTOSTERONE CYPIONATE 200 MG/ML IM SOLN
300.0000 mg | Freq: Once | INTRAMUSCULAR | Status: AC
  Administered 2018-02-11: 300 mg via INTRAMUSCULAR

## 2018-02-11 NOTE — Patient Instructions (Addendum)
Flexor Carpi Ulnaris and [employer]onic Radialis Tendinitis Rehab Ask your health care provider which exercises are safe for you. Do exercises exactly as told by your health care provider and adjust them as directed. It is normal to feel mild stretching, pulling, tightness, or discomfort as you do these exercises, but you should stop right away if you feel sudden pain or your pain gets worse.Do not begin these exercises until told by your health care provider. Stretching These exercises warm up your muscles and joints and improve the movement and flexibility of your forearm. These exercises also help to relieve pain, numbness, and tingling.These exercises are done using your healthy forearm to help stretch the muscles in your injured forearm. Exercise A: Wrist flexion, passive 1. Extend your __________ arm in front of you, relax your wrist, and point your fingers downward. 2. Gently push on the back of your hand until you feel a gentle stretch on the top of your forearm. 3. Hold this position for __________ seconds. 4. Slowly return to the starting position. Repeat __________ times. Complete this exercise __________ times a day. Exercise B: Wrist extension, passive 1. Extend your __________ arm in front of you and turn your palm upward. 2. Gently pull your palm and fingertips back so your fingers point downward. You should feel a gentle stretch on the palm-side of your forearm. 3. Hold this position for __________ seconds. 4. Slowly return to the starting position. Exercise C: Forearm rotation, palm up, passive 1. Sit with your __________ elbow bent to an "L" shape (90 degrees) with your forearm resting on a table. 2. Keeping your upper body and shoulder still, use your other hand to rotate your forearm palm up until you feel a gentle to moderate stretch. 3. Hold this position for __________ seconds. 4. Slowly release the stretch, and return to the starting position. Repeat __________ times.  Complete this exercise __________ times a day. Exercise D: Forearm rotation, palm down, passive 1. Sit with your __________ elbow bent to an "L" shape (90 degrees) with your forearm resting on a table. 2. Keeping your upper body and shoulder still, use your other hand to rotate your forearm palm down until you feel a gentle to moderate stretch. 3. Hold this position for __________ seconds. 4. Slowly release the stretch, and return to the starting position. Repeat __________ times. Complete this exercise __________ times a day. Range of motion exercises These exercises warm up your muscles and joints and improve the movement and flexibility of your forearm. These exercises also help to relieve pain, numbness, and tingling.These exercises are done using the muscles in your injured forearm. Exercise E: Wrist flexion, active 1. With your fingers relaxed, bend your wrist forward as far as you can. 2. Hold this position for __________ seconds. 3. Slowly return to the starting position. Repeat __________ times. Complete this exercise __________ times a day. Exercise F: Wrist extension, active 1. With your fingers relaxed, bend your wrist backward as far as you can. 2. Hold this position for __________ seconds. 3. Slowly return to the starting position. Repeat __________ times. Complete this exercise __________ times a day. Exercise G: Supination, active 1. Stand or sit with your arms at your sides. 2. Bend your __________ elbow to an "L" shape (90 degrees). 3. Turn your palm upward until you feel a gentle stretch on the inside of your forearm. 4. Hold this position for __________ seconds. 5. Slowly return your palm to the starting position. Repeat __________ times. Complete this  exercise __________ times a day. Exercise H: Pronation, active 1. Stand or sit with your arms at your sides. 2. Bend your __________ elbow to an "L" shape (90 degrees). 3. Turn your palm downward until you feel a  gentle stretch on the top of your forearm. 4. Hold this position for __________ seconds. 5. Slowly return your palm to the starting position. Repeat __________ times. Complete this exercise __________ times a day. Strengthening exercises These exercises build strength and endurance in your wrist and forearm. Endurance is the ability to use your muscles for a long time, even after they get tired. Exercise I: Wrist flexors 1. Sit with your __________ forearm supported on a table and your hand resting palm-up over the edge of the table. Your elbow should be below the level of your shoulder. 2. Hold a __________ weight in your __________ hand. Or, hold a rubber exercise band or tube in both hands, keeping your hands at the same level and hip distance apart. There should be a slight tension in the exercise band or tube. 3. Slowly curl your hand up toward your forearm. 4. Hold this position for __________ seconds. 5. Slowly lower your hand back to the starting position. Repeat __________ times. Complete this exercise __________ times a day. Exercise J: Wrist extensors 1. Sit with your __________ forearm supported on a table and your hand resting palm-down over the edge of the table. Your elbow should be below the level of your shoulder. 2. Hold a __________ weight in your __________ hand. Or, hold a rubber exercise band or tube in both hands, keeping your hands at the same level and hip distance apart. There should be a slight tension in the exercise band or tube. 3. Slowly curl your hand up toward your forearm. 4. Hold this position for __________ seconds. 5. Slowly lower your hand back to the starting position. Repeat __________ times. Complete this exercise __________ times a day. Exercise K: Ulnar deviators 1. Stand with a __________ weight in your __________ hand. Or, sit with your healthy hand supported, and hold onto a rubber exercises band or tube. There should be a slight tension in the  exercise band or tube. 2. Move your __________ wrist so your pinkie travels toward your forearm and your thumb moves away from your forearm. 3. Hold this position for __________ seconds. 4. Slowly lower your wrist to the starting position. Repeat __________ times. Complete this exercise __________ times a day. Exercise L: Radial deviators 1. Stand with a __________ weight in your __________ hand. Or, sit and hold onto a rubber exercise band or tube while your __________ arm is supported on a table and the other arm is below the table. There should be a slight tension in the exercise band or tube. ? If you are holding a weight, raise your __________ hand so your thumb moves toward your forearm at a comfortable height. You will not need to raise your hand very far. ? If you are holding an exercise band or tube, pull on it. 2. Hold this position for __________ seconds. 3. Slowly lower your wrist to the starting position. Repeat __________ times. Complete this exercise __________ times a day. Exercise M: Forearm rotation, palm up 1. Sit with your __________ forearm supported on a table and your hand resting palm-down. Your elbow should be at your side, below the level of your shoulder, and bent to an "L" shape (about 90 degrees). Keep your wrist stable. Do not allow it to move backward  or forward during the exercise. 2. Gently hold a lightweight hammer with your __________ hand. 3. Without moving your elbow or wrist, slowly rotate your palm upward to a thumbs-up position. 4. Hold this position for __________ seconds. 5. Slowly return your forearm to the starting position. Repeat __________ times. Complete this exercise __________ times a day. Exercise N: Forearm rotation, palm down 1. Sit with your __________ forearm supported on a table and your hand resting palm-up. Your elbow should be at your side, below the level of your shoulder, and bent to an "L" shape (about 90 degrees). Keep your wrist  stable. Do not allow it to move backward or forward during the exercise. 2. Gently hold a lightweight hammer with your __________ hand. 3. Without moving your elbow or wrist, slowly rotate your palm and hand upward to a thumbs-up position. 4. Hold this position for __________ seconds. 5. Slowly return your forearm to the starting position. Repeat __________ times. Complete this exercise __________ times a day. Exercise O: Grip 1. Hold one of these items in your __________ hand: modelling clay, therapy putty, a dense sponge, a stress ball, or a large, rolled sock. 2. Squeeze as hard as you can without increasing any pain. 3. Hold this position for __________ seconds. 4. Slowly release your grip. Repeat __________ times. Complete this exercise __________ times a day. This information is not intended to replace advice given to you by your health care provider. Make sure you discuss any questions you have with your health care provider. Document Released: 08/11/2016 Document Revised: 08/13/2016 Document Reviewed: 08/11/2016 Elsevier Interactive Patient Education  2018 [employer] and [employer] Radialis Tendinitis What are the causes? These conditions may be caused by:  Repetitive motions or overuse (common).  Wear and tear. (common).  An injury.  Excessive exercise or strain.  Certain antibiotic medicines.  In some cases, the cause may not be known. What increases the risk? These conditions are more likely to develop in:  People who play sports that involve constantly flexing or stretching the wrist and forearm, such as volleyball and water polo.  Older adults.  People with have a job that involves flexing the wrist over and over, such as people who work as Chiropractor, Psychologist, prison and probation services, Personnel officer.  People with certain health conditions, such as: ? Rheumatoid arthritis. ? Gout. ? Diabetes.  What are the signs or symptoms? Symptoms of these conditions may  develop gradually. Symptoms include:  Pain or tenderness in the wrist.  Pain when flexing or stretching the wrist.  Pain when gripping or lifting with the palm of the hand.  Swelling.  How is this diagnosed? This condition may be diagnosed based on:  Your symptoms.  Your medical history.  A physical exam.  During the physical exam, you may be asked to move your hand, wrist, and arm in certain ways. In order to rule out another condition, your health care provider may order one or more of the following tests:  MRI to get detailed images of the body's soft tissues and detect tendon tears and inflammation.  Ultrasound to detect soft-tissue injuries, such as tears and inflammation of the ligaments or tendons.  How is this treated? Treatment for this condition may include:  Rest. You should limit activities that cause your symptoms to get worse or flare up.  Heat and ice treatment. Both heat and cold can help to ease pain and may be applied to the wrist or forearm as needed to reduce pain  and inflammation.  Splint. You may need to wear a splint to keep your wrist and forearm from moving (keep them immobilized) until your symptoms improve.  Medicine. Your health care provider may prescribe steroids or other anti-inflammatory medicines, like ibuprofen, to temporarily ease your pain and other symptoms.  Physical therapy. Your health care provider may ask you to do exercises to maintain mobility and range of motion in your wrist.  Follow these instructions at home: If you have a splint:  Wear it as told by your health care provider. Remove it only as told by your health care provider.  Loosen the splint if your fingers tingle, become numb, or turn cold and blue.  Do not let your splint get wet if it is not waterproof.  Keep the splint clean. Managing pain, stiffness, and swelling  If directed, apply ice to the injured area. ? Put ice in a plastic bag. ? Place a damp towel  between your skin and the bag. ? Leave the ice on for 20 minutes, 2-3 times a day.  Move your fingers often to avoid stiffness and to lessen swelling.  Raise (elevate) the injured area above the level of your heart while you are sitting or lying down. Activity  Return to your normal activities as told by your health care provider. Ask your health care provider what activities are safe for you.  Do exercises as told by your health care provider. General instructions  Do not use any tobacco products, including cigarettes, chewing tobacco, or e-cigarettes. Tobacco can delay healing. If you need help quitting, ask your health care provider.  Take over-the-counter and prescription medicines only as told by your health care provider.  Keep all follow-up visits as told by your health care provider. This is important. How is this prevented?  Warm up and stretch before being active.  Cool down and stretch after being active.  Give your body time to rest between periods of activity.  Make sure to use equipment that fits you.  Be safe and responsible while being active to avoid falls.  Do at least 150 minutes of moderate-intensity exercise each week, such as brisk walking or water aerobics.  Maintain physical fitness, including: ? Strength. ? Flexibility. ? Cardiovascular fitness. ? Endurance. Contact a health care provider if:  Your pain does not improve.  Your pain gets worse. Get help right away if:  Your pain is severe.  You cannot move your wrist. This information is not intended to replace advice given to you by your health care provider. Make sure you discuss any questions you have with your health care provider. Document Released: 12/07/2005 Document Revised: 08/11/2016 Document Reviewed: 08/16/2015 Elsevier Interactive Patient Education  2017 [employer].

## 2018-02-11 NOTE — Progress Notes (Signed)
Call pt: no fracture. Treatment plan same as in office for tendonitis.

## 2018-02-11 NOTE — Progress Notes (Signed)
Subjective:    Patient ID: Brandon Mckee, male    DOB: 20-Feb-1966, 52 y.o.   MRN: [id number]  HPI  Pt is a 52 yo male with pre-diabetes, hypertension,dysmetabolic syndrome who presents to clinic for follow up and discuss right wrist pain.   Pre-diabetes- over the past 6-8 months patient has been working on getting healthier and losing weight. His A!C continues to drop. He is not on any medications. He does not check his sugars. He is working a lot and staying active with bowling.   HTN- pt is still taking 3 BP pills. Denies any CP, palpitations, headaches, or vision changes.   Hyperlipidemia- he admits he has stopped taking lipitor and niacin. He "wonders if he should still be taking". Stopped in June 2018.   Right wrist pain present for the last few weeks. Not getting worse or better. No known injury. It is the hand he uses to bowl at least twice a week. Denies any radiation of pain/numbness/tingling up arm or into hand. Tenderness is noted over the palmar side radial wrist to palpation.   .. Active Ambulatory Problems    Diagnosis Date Noted  . B12 DEFICIENCY 06/30/2010  . HYPERLIPIDEMIA 12/08/2006  . DYSMETABOLIC SYNDROME 21/19/4174  . OBESITY NOS 12/08/2006  . HYPERTENSION, BENIGN 11/22/2006  . Osteoarthritis of both knees 12/27/2008  . FATIGUE 09/25/2008  . LEG EDEMA 05/03/2009  . Obstructive sleep apnea 10/11/2013  . Male hypogonadism 05/25/2014  . Depression 01/03/2016  . Anxiety disorder 01/03/2016  . Dyslipidemia 01/07/2016  . Vitamin D deficiency 01/07/2016  . Osteoarthritis of right acromioclavicular joint 01/17/2016  . Microalbuminuria 04/01/2016  . Hypogonadism in male 07/03/2016  . Trapezius muscle spasm 11/24/2016  . Colon polyps 01/01/2017  . Diverticulosis of colon without hemorrhage 01/01/2017  . Benign prostatic hyperplasia without lower urinary tract symptoms 04/25/2017  . Grief reaction 08/09/2017  . Obesity (BMI 30-39.9) 09/10/2017   Resolved  Ambulatory Problems    Diagnosis Date Noted  . STYE 05/03/2009  . GASTROENTERITIS 01/03/2009  . SKIN TAG 12/08/2006  . LATERAL EPICONDYLITIS, BILATERAL 01/31/2010  . Pes anserinus tendinitis or bursitis 12/31/2008  . Tinea cruris 06/06/2013  . Testicular hypofunction 11/09/2014  . Pre-diabetes 12/07/2014  . Essential hypertension 01/03/2016   Past Medical History:  Diagnosis Date  . Hyperlipidemia   . Hypertension   . Metabolic syndrome   . Pes anserine bursitis      Review of Systems  All other systems reviewed and are negative.      Objective:   Physical Exam  Constitutional: He is oriented to person, place, and time. He appears well-developed and well-nourished.  Obese.   HENT:  Head: Normocephalic and atraumatic.  Cardiovascular: Normal rate, regular rhythm and normal heart sounds.  Pulmonary/Chest: Effort normal and breath sounds normal.  Musculoskeletal:  Right wrist:  NROM.  5/5 strength of right wrist.  No swelling or brusing.  Negative finklestein.  Tenderness palmar side radial head. No palpable mass.   Neurological: He is alert and oriented to person, place, and time.  Skin: Skin is dry.  Psychiatric: He has a normal mood and affect. His behavior is normal.          Assessment & Plan:   [name] KitchenMarland KitchenDiagnoses and all orders for this visit:  Male hypogonadism -     testosterone cypionate (DEPOTESTOSTERONE CYPIONATE) injection 300 mg -     Testosterone  Pre-diabetes -     POCT HgB A1C -     COMPLETE  METABOLIC PANEL WITH GFR  HYPERTENSION, BENIGN -     COMPLETE METABOLIC PANEL WITH GFR  DYSMETABOLIC SYNDROME -     Lipid Panel w/reflex Direct LDL -     COMPLETE METABOLIC PANEL WITH GFR  Medication management -     PSA -     CBC  Right wrist pain -     DG Wrist Complete Right   .[name] Results for orders placed or performed in visit on 02/11/18  Lipid Panel w/reflex Direct LDL  Result Value Ref Range   Cholesterol 233 (H) <200 mg/dL   HDL 29 (L)  >40 mg/dL   Triglycerides 155 (H) <150 mg/dL   LDL Cholesterol (Calc) 174 (H) mg/dL (calc)   Total CHOL/HDL Ratio 8.0 (H) <5.0 (calc)   Non-HDL Cholesterol (Calc) 204 (H) <130 mg/dL (calc)  COMPLETE METABOLIC PANEL WITH GFR  Result Value Ref Range   Glucose, Bld 111 (H) 65 - 99 mg/dL   BUN 12 7 - 25 mg/dL   Creat 0.92 0.70 - 1.33 mg/dL   GFR, Est Non African American 96 > OR = 60 mL/min/1.39m2   GFR, Est African American 111 > OR = 60 mL/min/1.20m2   BUN/Creatinine Ratio NOT APPLICABLE 6 - 22 (calc)   Sodium 140 135 - 146 mmol/L   Potassium 3.8 3.5 - 5.3 mmol/L   Chloride 102 98 - 110 mmol/L   CO2 28 20 - 32 mmol/L   Calcium 9.1 8.6 - 10.3 mg/dL   Total Protein 6.7 6.1 - 8.1 g/dL   Albumin 4.3 3.6 - 5.1 g/dL   Globulin 2.4 1.9 - 3.7 g/dL (calc)   AG Ratio 1.8 1.0 - 2.5 (calc)   Total Bilirubin 0.7 0.2 - 1.2 mg/dL   Alkaline phosphatase (APISO) 124 (H) 40 - 115 U/L   AST 14 10 - 35 U/L   ALT 18 9 - 46 U/L  Testosterone  Result Value Ref Range   Testosterone 291 250 - 827 ng/dL  PSA  Result Value Ref Range   PSA 1.2 < OR = 4.0 ng/mL  CBC  Result Value Ref Range   WBC 7.1 3.8 - 10.8 Thousand/uL   RBC 5.68 4.20 - 5.80 Million/uL   Hemoglobin 16.9 13.2 - 17.1 g/dL   HCT 48.1 38.5 - 50.0 %   MCV 84.7 80.0 - 100.0 fL   MCH 29.8 27.0 - 33.0 pg   MCHC 35.1 32.0 - 36.0 g/dL   RDW 13.0 11.0 - 15.0 %   Platelets 216 140 - 400 Thousand/uL   MPV 11.2 7.5 - 12.5 fL  POCT HgB A1C  Result Value Ref Range   Hemoglobin A1C 5.6     A!C is out of prediabetes range. Way to go. Not on any medication. Will do one more 3 month follow up and then start spacing them out.  Keep up weight loss and diabetic diet.  BP at goal.   Discussed going back on lipitor and niacin for prevention. Will call patient and let him know LDL numbners.   Xray ordered for right wrist. Could be some flexor carp radialis tendonitis. HO given. Could be arthritis in wrist joint or even start of ganglion cyst.  Encouraged to follow up with sports medicine in 2 weeks if rest, ice, NSAID have not improved symptoms.    Flexor carpi radialis tendonitis

## 2018-02-12 LAB — COMPLETE METABOLIC PANEL WITH GFR
AG Ratio: 1.8 (calc) (ref 1.0–2.5)
ALBUMIN MSPROF: 4.3 g/dL (ref 3.6–5.1)
ALT: 18 U/L (ref 9–46)
AST: 14 U/L (ref 10–35)
Alkaline phosphatase (APISO): 124 U/L — ABNORMAL HIGH (ref 40–115)
BUN: 12 mg/dL (ref 7–25)
CALCIUM: 9.1 mg/dL (ref 8.6–10.3)
CO2: 28 mmol/L (ref 20–32)
CREATININE: 0.92 mg/dL (ref 0.70–1.33)
Chloride: 102 mmol/L (ref 98–110)
GFR, EST AFRICAN AMERICAN: 111 mL/min/{1.73_m2} (ref >=60)
GFR, Est Non African American: 96 mL/min/{1.73_m2} (ref >=60)
GLUCOSE: 111 mg/dL — AB (ref 65–99)
Globulin: 2.4 g/dL (calc) (ref 1.9–3.7)
Potassium: 3.8 mmol/L (ref 3.5–5.3)
Sodium: 140 mmol/L (ref 135–146)
TOTAL PROTEIN: 6.7 g/dL (ref 6.1–8.1)
Total Bilirubin: 0.7 mg/dL (ref 0.2–1.2)

## 2018-02-12 LAB — LIPID PANEL W/REFLEX DIRECT LDL
Cholesterol: 233 mg/dL — ABNORMAL HIGH (ref <200)
HDL: 29 mg/dL — ABNORMAL LOW (ref >40)
LDL Cholesterol (Calc): 174 mg/dL (calc) — ABNORMAL HIGH
Non-HDL Cholesterol (Calc): 204 mg/dL (calc) — ABNORMAL HIGH (ref <130)
TRIGLYCERIDES: 155 mg/dL — AB (ref <150)
Total CHOL/HDL Ratio: 8 (calc) — ABNORMAL HIGH (ref <5.0)

## 2018-02-12 LAB — CBC
HCT: 48.1 % (ref 38.5–50.0)
HEMOGLOBIN: 16.9 g/dL (ref 13.2–17.1)
MCH: 29.8 pg (ref 27.0–33.0)
MCHC: 35.1 g/dL (ref 32.0–36.0)
MCV: 84.7 fL (ref 80.0–100.0)
MPV: 11.2 fL (ref 7.5–12.5)
PLATELETS: 216 10*3/uL (ref 140–400)
RBC: 5.68 10*6/uL (ref 4.20–5.80)
RDW: 13 % (ref 11.0–15.0)
WBC: 7.1 10*3/uL (ref 3.8–10.8)

## 2018-02-12 LAB — TESTOSTERONE: TESTOSTERONE: 291 ng/dL (ref 250–827)

## 2018-02-12 LAB — PSA: PSA: 1.2 ng/mL (ref <=4.0)

## 2018-02-13 ENCOUNTER — Encounter: Payer: Self-pay | Admitting: Physician Assistant

## 2018-02-13 NOTE — Progress Notes (Signed)
Call pt: cholesterol elevated. LDL 174 needs to restart lipitor. TG look much better than the past if he wants to stay off naicin he can. We will continue to monitor and recheck in 6 months.

## 2018-02-25 ENCOUNTER — Ambulatory Visit (INDEPENDENT_AMBULATORY_CARE_PROVIDER_SITE_OTHER): Payer: BLUE CROSS/BLUE SHIELD | Admitting: Physician Assistant

## 2018-02-25 VITALS — BP 127/83 | HR 75 | Wt 251.0 lb

## 2018-02-25 DIAGNOSIS — E291 Testicular hypofunction: Secondary | ICD-10-CM

## 2018-02-25 MED ORDER — TESTOSTERONE CYPIONATE 200 MG/ML IM SOLN
300.0000 mg | Freq: Once | INTRAMUSCULAR | Status: AC
  Administered 2018-02-25: 300 mg via INTRAMUSCULAR

## 2018-02-25 NOTE — Progress Notes (Signed)
Patient came into office today for testosterone injection. Denies chest pain, shortness of breath, headaches and problems associated with taking this medication. Patient states he has had no abnornal mood swings. Patient tolerated injection in LUOQ well without complications. Patient advised to schedule his next injection for 2 weeks from today. 

## 2018-03-11 ENCOUNTER — Ambulatory Visit (INDEPENDENT_AMBULATORY_CARE_PROVIDER_SITE_OTHER): Payer: BLUE CROSS/BLUE SHIELD | Admitting: Physician Assistant

## 2018-03-11 VITALS — BP 135/85 | HR 69 | Wt 252.0 lb

## 2018-03-11 DIAGNOSIS — E291 Testicular hypofunction: Secondary | ICD-10-CM | POA: Diagnosis not present

## 2018-03-11 MED ORDER — TESTOSTERONE CYPIONATE 200 MG/ML IM SOLN
300.0000 mg | Freq: Once | INTRAMUSCULAR | Status: AC
  Administered 2018-03-11: 300 mg via INTRAMUSCULAR

## 2018-03-11 NOTE — Progress Notes (Signed)
Patient came into office today for testosterone injection. Denies chest pain, shortness of breath, headaches and problems associated with taking this medication. Patient states he has had no abnornal mood swings. Patient tolerated injection in RUOQ well without complications. Patient advised to schedule his next injection for 2 weeks from today.  Agree with above plan. Iran Planas PA-C

## 2018-03-25 ENCOUNTER — Ambulatory Visit (INDEPENDENT_AMBULATORY_CARE_PROVIDER_SITE_OTHER): Payer: BLUE CROSS/BLUE SHIELD | Admitting: Sports Medicine

## 2018-03-25 VITALS — BP 129/79 | HR 66 | Temp 97.9°F | Wt 253.2 lb

## 2018-03-25 DIAGNOSIS — E291 Testicular hypofunction: Secondary | ICD-10-CM

## 2018-03-25 MED ORDER — TESTOSTERONE CYPIONATE 200 MG/ML IM SOLN
200.0000 mg | Freq: Once | INTRAMUSCULAR | Status: AC
  Administered 2018-03-25: 200 mg via INTRAMUSCULAR

## 2018-03-25 NOTE — Progress Notes (Signed)
Patient presented today for testosterone injection. Denies chest pain, SOB, headaches, mood changes or problems with medication. Last injection was on RUOQ, no induration or bruising noted on injection site. Pt stated he's "feeling great, despite the rain". Today's blood pressure reading was 129/79, pulse 66. Pt tolerated testosterone injection on LUOQ well without complications. Advised to schedule next testosterone injection in 2 wks. Pt informed we will call him with any updates or changes.

## 2018-04-07 ENCOUNTER — Ambulatory Visit: Payer: BLUE CROSS/BLUE SHIELD

## 2018-04-11 ENCOUNTER — Ambulatory Visit (INDEPENDENT_AMBULATORY_CARE_PROVIDER_SITE_OTHER): Payer: BLUE CROSS/BLUE SHIELD | Admitting: Sports Medicine

## 2018-04-11 VITALS — BP 127/70 | HR 87

## 2018-04-11 DIAGNOSIS — E291 Testicular hypofunction: Secondary | ICD-10-CM | POA: Diagnosis not present

## 2018-04-11 MED ORDER — TESTOSTERONE CYPIONATE 200 MG/ML IM SOLN
300.0000 mg | Freq: Once | INTRAMUSCULAR | Status: AC
  Administered 2018-04-11: 300 mg via INTRAMUSCULAR

## 2018-04-11 NOTE — Progress Notes (Signed)
   Subjective:    Patient ID: Brandon Mckee, male    DOB: 1966/07/10, 52 y.o.   MRN: [id number]  HPI  Brandon Mckee is here for a testosterone injection. Denies chest pain, shortness of breath, headaches or mood changes.   Review of Systems     Objective:   Physical Exam        Assessment & Plan:  Patient tolerated injection well without complications. Patient advised to schedule next injection 14 days from today.

## 2018-04-29 ENCOUNTER — Ambulatory Visit (INDEPENDENT_AMBULATORY_CARE_PROVIDER_SITE_OTHER): Payer: BLUE CROSS/BLUE SHIELD | Admitting: Physician Assistant

## 2018-04-29 VITALS — BP 117/70 | HR 61 | Wt 252.0 lb

## 2018-04-29 DIAGNOSIS — E291 Testicular hypofunction: Secondary | ICD-10-CM | POA: Diagnosis not present

## 2018-04-29 MED ORDER — TESTOSTERONE CYPIONATE 200 MG/ML IM SOLN
300.0000 mg | Freq: Once | INTRAMUSCULAR | Status: AC
  Administered 2018-04-29: 300 mg via INTRAMUSCULAR

## 2018-04-29 NOTE — Progress Notes (Signed)
   Subjective:    Patient ID: Brandon Mckee, male    DOB: 04/10/1966, 51 y.o.   MRN: [id number]  HPI  Brandon Mckee is here for a testosterone injection. Denies chest pain, shortness of breath, headaches or mood changes.   Review of Systems     Objective:   Physical Exam        Assessment & Plan:  Patient tolerated injection well without complications. Patient advised to schedule next injection 14 days from today.   Agree with above plan. Jade Breeback PA-c 

## 2018-05-13 ENCOUNTER — Encounter: Payer: Self-pay | Admitting: Physician Assistant

## 2018-05-13 ENCOUNTER — Ambulatory Visit: Payer: BLUE CROSS/BLUE SHIELD | Admitting: Physician Assistant

## 2018-05-13 VITALS — BP 116/70 | HR 62 | Ht 71.0 in | Wt 253.0 lb

## 2018-05-13 DIAGNOSIS — K2100 Gastro-esophageal reflux disease with esophagitis, without bleeding: Secondary | ICD-10-CM | POA: Insufficient documentation

## 2018-05-13 DIAGNOSIS — M1A09X0 Idiopathic chronic gout, multiple sites, without tophus (tophi): Secondary | ICD-10-CM | POA: Diagnosis not present

## 2018-05-13 DIAGNOSIS — I10 Essential (primary) hypertension: Secondary | ICD-10-CM | POA: Diagnosis not present

## 2018-05-13 DIAGNOSIS — E291 Testicular hypofunction: Secondary | ICD-10-CM

## 2018-05-13 DIAGNOSIS — E8881 Metabolic syndrome: Secondary | ICD-10-CM | POA: Diagnosis not present

## 2018-05-13 DIAGNOSIS — N400 Enlarged prostate without lower urinary tract symptoms: Secondary | ICD-10-CM | POA: Diagnosis not present

## 2018-05-13 DIAGNOSIS — R7303 Prediabetes: Secondary | ICD-10-CM

## 2018-05-13 DIAGNOSIS — M170 Bilateral primary osteoarthritis of knee: Secondary | ICD-10-CM | POA: Diagnosis not present

## 2018-05-13 DIAGNOSIS — Z6835 Body mass index (BMI) 35.0-35.9, adult: Secondary | ICD-10-CM

## 2018-05-13 LAB — POCT GLYCOSYLATED HEMOGLOBIN (HGB A1C): Hemoglobin A1C: 5.9 % — AB (ref 4.0–5.6)

## 2018-05-13 MED ORDER — ALLOPURINOL 100 MG PO TABS: 100.0000 mg | ORAL_TABLET | Freq: Every day | ORAL | 5 refills | Status: DC

## 2018-05-13 MED ORDER — MELOXICAM 15 MG PO TABS: ORAL_TABLET | ORAL | 5 refills | Status: DC

## 2018-05-13 MED ORDER — AMLODIPINE BESYLATE 10 MG PO TABS: 10.0000 mg | ORAL_TABLET | Freq: Every day | ORAL | 5 refills | Status: DC

## 2018-05-13 MED ORDER — ATORVASTATIN CALCIUM 40 MG PO TABS
40.0000 mg | ORAL_TABLET | Freq: Every day | ORAL | 5 refills | Status: DC
Start: 2018-05-13 — End: 2018-11-11

## 2018-05-13 MED ORDER — TESTOSTERONE CYPIONATE 200 MG/ML IM SOLN
300.0000 mg | Freq: Once | INTRAMUSCULAR | Status: AC
  Administered 2018-05-13: 300 mg via INTRAMUSCULAR

## 2018-05-13 MED ORDER — LISINOPRIL-HYDROCHLOROTHIAZIDE 20-25 MG PO TABS: 1.0000 | ORAL_TABLET | Freq: Every day | ORAL | 5 refills | Status: DC

## 2018-05-13 MED ORDER — TAMSULOSIN HCL 0.4 MG PO CAPS
0.4000 mg | ORAL_CAPSULE | Freq: Every day | ORAL | 5 refills | Status: DC
Start: 2018-05-13 — End: 2018-11-11

## 2018-05-13 MED ORDER — NIACIN ER (ANTIHYPERLIPIDEMIC) 500 MG PO TBCR: 500.0000 mg | EXTENDED_RELEASE_TABLET | Freq: Every day | ORAL | 1 refills | Status: DC

## 2018-05-13 MED ORDER — CARVEDILOL 25 MG PO TABS: 25.0000 mg | ORAL_TABLET | Freq: Two times a day (BID) | ORAL | 5 refills | Status: DC

## 2018-05-13 NOTE — Progress Notes (Signed)
Subjective:    Patient ID: Brandon Mckee, male    DOB: 03/02/1966, 52 y.o.   MRN: [id number]  HPI  Patient is a 52 year old male who presents to the clinic for six-month follow-up. .. Active Ambulatory Problems    Diagnosis Date Noted  . B12 DEFICIENCY 06/30/2010  . HYPERLIPIDEMIA 12/08/2006  . DYSMETABOLIC SYNDROME 73/22/0254  . Class 2 severe obesity due to excess calories with serious comorbidity and body mass index (BMI) of 35.0 to 35.9 in adult ([place]) 12/08/2006  . HYPERTENSION, BENIGN 11/22/2006  . Osteoarthritis of both knees 12/27/2008  . FATIGUE 09/25/2008  . LEG EDEMA 05/03/2009  . Obstructive sleep apnea 10/11/2013  . Male hypogonadism 05/25/2014  . Pre-diabetes 12/07/2014  . Depression 01/03/2016  . Anxiety disorder 01/03/2016  . Dyslipidemia 01/07/2016  . Vitamin D deficiency 01/07/2016  . Osteoarthritis of right acromioclavicular joint 01/17/2016  . Microalbuminuria 04/01/2016  . Hypogonadism in male 07/03/2016  . Trapezius muscle spasm 11/24/2016  . Colon polyps 01/01/2017  . Diverticulosis of colon without hemorrhage 01/01/2017  . Benign prostatic hyperplasia without lower urinary tract symptoms 04/25/2017  . Grief reaction 08/09/2017  . Obesity (BMI 30-39.9) 09/10/2017  . Gastroesophageal reflux disease with esophagitis 05/13/2018  . Chronic gout of multiple sites 05/13/2018   Resolved Ambulatory Problems    Diagnosis Date Noted  . STYE 05/03/2009  . GASTROENTERITIS 01/03/2009  . SKIN TAG 12/08/2006  . LATERAL EPICONDYLITIS, BILATERAL 01/31/2010  . Pes anserinus tendinitis or bursitis 12/31/2008  . Tinea cruris 06/06/2013  . Testicular hypofunction 11/09/2014  . Essential hypertension 01/03/2016   Past Medical History:  Diagnosis Date  . Hyperlipidemia   . Hypertension   . Metabolic syndrome   . Pes anserine bursitis    Hypertension-patient is doing well.  He denies any chest pains, palpitations, headaches or vision  changes.  Prediabetes-patient has recently been off all diabetic medication and in prediabetes range.  He has done well with his diet changes and weight loss.  He has gained a few pounds over the past few months.    Patient is not using his CPAP for apnea.  He states his sleeping and snoring has gotten better since he lost weight.  He denies any prostate issues.  He continues to take Flomax.  Patient did get his testosterone shot today.  He feels like his energy is better.  He denies any depression or anxiety.     Review of Systems  All other systems reviewed and are negative.      Objective:   Physical Exam  Constitutional: He is oriented to person, place, and time. He appears well-developed and well-nourished.  Obese.   HENT:  Head: Normocephalic and atraumatic.  Neck: Normal range of motion. Neck supple.  Cardiovascular: Normal rate and regular rhythm.  Pulmonary/Chest: Effort normal and breath sounds normal.  Lymphadenopathy:    He has no cervical adenopathy.  Neurological: He is alert and oriented to person, place, and time.  Psychiatric: He has a normal mood and affect. His behavior is normal.          Assessment & Plan:  [name] KitchenMarland KitchenOlof was seen today for diabetes and hypogonadism.  Diagnoses and all orders for this visit:  Pre-diabetes -     POCT HgB A1C  Male hypogonadism -     testosterone cypionate (DEPOTESTOSTERONE CYPIONATE) injection 300 mg  Benign prostatic hyperplasia without lower urinary tract symptoms -     tamsulosin (FLOMAX) 0.4 MG CAPS capsule; Take 1 capsule (0.4 mg  total) by mouth daily after supper.  DYSMETABOLIC SYNDROME -     niacin (NIASPAN) 500 MG CR tablet; Take 1 tablet (500 mg total) by mouth at bedtime. -     atorvastatin (LIPITOR) 40 MG tablet; Take 1 tablet (40 mg total) by mouth daily. -     Lipid Panel w/reflex Direct LDL  Essential hypertension -     amLODipine (NORVASC) 10 MG tablet; Take 1 tablet (10 mg total) by mouth  daily. -     lisinopril-hydrochlorothiazide (PRINZIDE,ZESTORETIC) 20-25 MG tablet; Take 1 tablet by mouth daily. -     carvedilol (COREG) 25 MG tablet; Take 1 tablet (25 mg total) by mouth 2 (two) times daily with a meal. -     COMPLETE METABOLIC PANEL WITH GFR  Gastroesophageal reflux disease with esophagitis  Primary osteoarthritis of both knees -     meloxicam (MOBIC) 15 MG tablet; TAKE 1 TABLET BY MOUTH IN THE MORNING WITH BREAKFAST FOR 2 WEEKS THEN 1 TAB DAILY AS NEEDED FOR PAIN  Class 2 severe obesity due to excess calories with serious comorbidity and body mass index (BMI) of 35.0 to 35.9 in adult [facility])  Chronic gout of multiple sites, unspecified cause -     allopurinol (ZYLOPRIM) 100 MG tablet; Take 1 tablet (100 mg total) by mouth daily.   .. Lab Results  Component Value Date   HGBA1C 5.9 (A) 05/13/2018   A1c looks great today.  It is up just the time from last recheck. On STATIN.  BP controlled on ACE.  Eye exam up to date.  Discussed weight. Keep up the diet and continue to lose weight.   Medications refilled.   Labs given to get rechecked.   Follow up in 6 months.

## 2018-05-27 ENCOUNTER — Ambulatory Visit (INDEPENDENT_AMBULATORY_CARE_PROVIDER_SITE_OTHER): Payer: BLUE CROSS/BLUE SHIELD | Admitting: Physician Assistant

## 2018-05-27 VITALS — BP 136/79 | HR 65 | Ht 71.0 in | Wt 256.0 lb

## 2018-05-27 DIAGNOSIS — E8881 Metabolic syndrome: Secondary | ICD-10-CM | POA: Diagnosis not present

## 2018-05-27 DIAGNOSIS — E291 Testicular hypofunction: Secondary | ICD-10-CM

## 2018-05-27 DIAGNOSIS — I10 Essential (primary) hypertension: Secondary | ICD-10-CM | POA: Diagnosis not present

## 2018-05-27 MED ORDER — TESTOSTERONE CYPIONATE 200 MG/ML IM SOLN
300.0000 mg | Freq: Once | INTRAMUSCULAR | Status: AC
  Administered 2018-05-27: 300 mg via INTRAMUSCULAR

## 2018-05-27 NOTE — Progress Notes (Signed)
   Subjective:    Patient ID: Brandon Mckee, male    DOB: [birth year], 52 y.o.   MRN: [id number]  HPI Pt here for Testosterone injection. Patient states no complaints of chest pain, H/A, SOB or any mood changes. Patient tolerated injection well with no complications. KG LPN   Review of Systems     Objective:   Physical Exam        Assessment & Plan:  Pt advised to schedule next injection for 2 weeks. KG LPN  Agree above plan. Iran Planas PA-C

## 2018-05-28 LAB — COMPLETE METABOLIC PANEL WITH GFR
AG Ratio: 2 (calc) (ref 1.0–2.5)
ALT: 17 U/L (ref 9–46)
AST: 15 U/L (ref 10–35)
Albumin: 4.4 g/dL (ref 3.6–5.1)
Alkaline phosphatase (APISO): 119 U/L — ABNORMAL HIGH (ref 40–115)
BUN: 16 mg/dL (ref 7–25)
CALCIUM: 8.9 mg/dL (ref 8.6–10.3)
CO2: 33 mmol/L — AB (ref 20–32)
CREATININE: 1.14 mg/dL (ref 0.70–1.33)
Chloride: 101 mmol/L (ref 98–110)
GFR, EST AFRICAN AMERICAN: 86 mL/min/{1.73_m2} (ref >=60)
GFR, EST NON AFRICAN AMERICAN: 74 mL/min/{1.73_m2} (ref >=60)
GLOBULIN: 2.2 g/dL (ref 1.9–3.7)
Glucose, Bld: 112 mg/dL — ABNORMAL HIGH (ref 65–99)
Potassium: 3.6 mmol/L (ref 3.5–5.3)
SODIUM: 141 mmol/L (ref 135–146)
TOTAL PROTEIN: 6.6 g/dL (ref 6.1–8.1)
Total Bilirubin: 0.8 mg/dL (ref 0.2–1.2)

## 2018-05-28 LAB — LIPID PANEL W/REFLEX DIRECT LDL
CHOL/HDL RATIO: 7.2 (calc) — AB (ref <5.0)
Cholesterol: 181 mg/dL (ref <200)
HDL: 25 mg/dL — AB (ref >40)
LDL CHOLESTEROL (CALC): 122 mg/dL — AB
NON-HDL CHOLESTEROL (CALC): 156 mg/dL — AB (ref <130)
TRIGLYCERIDES: 217 mg/dL — AB (ref <150)

## 2018-05-31 NOTE — Progress Notes (Signed)
Call pt: LDL is not quite to goal. Are you taking lipitor 40mg  daily? I first want to confirm this before increasing medication to get to LDL goal of below 70.

## 2018-06-10 ENCOUNTER — Ambulatory Visit (INDEPENDENT_AMBULATORY_CARE_PROVIDER_SITE_OTHER): Payer: BLUE CROSS/BLUE SHIELD | Admitting: Physician Assistant

## 2018-06-10 ENCOUNTER — Telehealth: Payer: Self-pay

## 2018-06-10 VITALS — BP 122/77 | HR 68 | Temp 97.9°F | Wt 250.0 lb

## 2018-06-10 DIAGNOSIS — E291 Testicular hypofunction: Secondary | ICD-10-CM

## 2018-06-10 MED ORDER — TESTOSTERONE CYPIONATE 200 MG/ML IM SOLN
300.0000 mg | Freq: Once | INTRAMUSCULAR | Status: AC
  Administered 2018-06-10: 300 mg via INTRAMUSCULAR

## 2018-06-10 NOTE — Telephone Encounter (Signed)
Was able to review recent labs with pt at his nurse visit. He states that he is not doing well with taking his Lipitor daily. I advised him that he needs to make sure that he is better about taking it daily so that we can better help to manage his cholesterol.   Pt agreeable and states that he will begin taking it daily. FYI to provider

## 2018-06-10 NOTE — Progress Notes (Signed)
   Subjective:    Patient ID: Brandon Mckee, male    DOB: February 25, 1966, 52 y.o.   MRN: [id number]  HPI Patient is here for a testosterone injection. Denies chest pain, shortness of breath, headaches, problems with mood change, or medication problems.    Review of Systems     Objective:   Physical Exam        Assessment & Plan:  Patient tolerated injection in [place] well without complications. Patient advised to schedule his next injection for 2 weeks from today.  Agree with above plan. Iran Planas PA-C

## 2018-06-22 ENCOUNTER — Ambulatory Visit (INDEPENDENT_AMBULATORY_CARE_PROVIDER_SITE_OTHER): Payer: BLUE CROSS/BLUE SHIELD | Admitting: Physician Assistant

## 2018-06-22 VITALS — BP 124/71 | HR 70 | Wt 253.0 lb

## 2018-06-22 DIAGNOSIS — E291 Testicular hypofunction: Secondary | ICD-10-CM

## 2018-06-22 MED ORDER — TESTOSTERONE CYPIONATE 200 MG/ML IM SOLN
300.0000 mg | INTRAMUSCULAR | Status: DC
  Administered 2018-06-22 – 2018-07-08 (×2): 300 mg via INTRAMUSCULAR

## 2018-06-22 NOTE — Progress Notes (Signed)
   Subjective:    Patient ID: Brandon Mckee, male    DOB: 1966/03/16, 52 y.o.   MRN: [id number]  HPI Patient in office today for testosterone injection. Patient has no complaints of SOB, palpitations, chest pain or any mood changes. Pt tolerated injection without any complications. KG LPN   Review of Systems     Objective:   Physical Exam        Assessment & Plan:  Advised patient to schedule next injection for 2 weeks.KG LPN  Agree with above plan. Iran Planas PA-C

## 2018-06-24 ENCOUNTER — Ambulatory Visit: Payer: BLUE CROSS/BLUE SHIELD

## 2018-07-08 ENCOUNTER — Ambulatory Visit (INDEPENDENT_AMBULATORY_CARE_PROVIDER_SITE_OTHER): Payer: BLUE CROSS/BLUE SHIELD | Admitting: Physician Assistant

## 2018-07-08 VITALS — BP 122/70 | HR 63 | Wt 254.0 lb

## 2018-07-08 DIAGNOSIS — E291 Testicular hypofunction: Secondary | ICD-10-CM | POA: Diagnosis not present

## 2018-07-08 NOTE — Progress Notes (Signed)
   Subjective:    Patient ID: Brandon Mckee, male    DOB: 08/26/1966, 52 y.o.   MRN: [id number]  HPI  Dallan Schonberg Oquin is here for a testosterone injection. Denies chest pain, shortness of breath, headaches or mood changes.   Review of Systems     Objective:   Physical Exam        Assessment & Plan:  Patient tolerated injection well without complications. Patient advised to schedule next injection 14 days from today.   Agree with above plan. Iran Planas PA-C

## 2018-07-22 ENCOUNTER — Ambulatory Visit (INDEPENDENT_AMBULATORY_CARE_PROVIDER_SITE_OTHER): Payer: BLUE CROSS/BLUE SHIELD | Admitting: Physician Assistant

## 2018-07-22 VITALS — BP 128/80 | HR 62 | Wt 254.0 lb

## 2018-07-22 DIAGNOSIS — E291 Testicular hypofunction: Secondary | ICD-10-CM | POA: Diagnosis not present

## 2018-07-22 MED ORDER — TESTOSTERONE CYPIONATE 200 MG/ML IM SOLN
200.0000 mg | Freq: Once | INTRAMUSCULAR | Status: AC
  Administered 2018-07-22: 200 mg via INTRAMUSCULAR

## 2018-07-22 NOTE — Progress Notes (Signed)
Brandon Mckee presents to the clinic for a testosterone injection. Denies SOB, chest pain, headache, and mood changes. Pt tolerated injection in [place] well without complications.  Pt advise to make next appointment in 14 days. -[facility]/RMA  -EH/RMA  Agree with above plan. Iran Planas PA-C

## 2018-08-05 ENCOUNTER — Ambulatory Visit (INDEPENDENT_AMBULATORY_CARE_PROVIDER_SITE_OTHER): Payer: BLUE CROSS/BLUE SHIELD | Admitting: Family Medicine

## 2018-08-05 VITALS — BP 129/79 | HR 65 | Wt 266.0 lb

## 2018-08-05 DIAGNOSIS — Z23 Encounter for immunization: Secondary | ICD-10-CM | POA: Diagnosis not present

## 2018-08-05 DIAGNOSIS — E291 Testicular hypofunction: Secondary | ICD-10-CM

## 2018-08-05 MED ORDER — TESTOSTERONE CYPIONATE 200 MG/ML IM SOLN
200.0000 mg | Freq: Once | INTRAMUSCULAR | Status: AC
  Administered 2018-08-05: 200 mg via INTRAMUSCULAR

## 2018-08-05 NOTE — Progress Notes (Signed)
Agree with documentation as above.   Lorice Lafave, MD  

## 2018-08-05 NOTE — Progress Notes (Signed)
   Subjective:    Patient ID: Brandon Mckee, male    DOB: [birth year], 52 y.o.   MRN: [id number]  HPI  Rockland Kotarski Towson is here for a testosterone injection and a flu vaccine. Denies chest pain, shortness of breath, headaches or mood changes.    Review of Systems     Objective:   Physical Exam        Assessment & Plan:  Patient tolerated injection well without complications. Patient advised to schedule next injection 14 days from today.

## 2018-08-19 ENCOUNTER — Ambulatory Visit (INDEPENDENT_AMBULATORY_CARE_PROVIDER_SITE_OTHER): Payer: BLUE CROSS/BLUE SHIELD | Admitting: Physician Assistant

## 2018-08-19 VITALS — BP 124/81 | HR 62

## 2018-08-19 DIAGNOSIS — E291 Testicular hypofunction: Secondary | ICD-10-CM

## 2018-08-19 MED ORDER — TESTOSTERONE CYPIONATE 200 MG/ML IM SOLN
300.0000 mg | Freq: Once | INTRAMUSCULAR | Status: AC
  Administered 2018-08-19: 300 mg via INTRAMUSCULAR

## 2018-08-19 NOTE — Progress Notes (Signed)
   Subjective:    Patient ID: Brandon Mckee, male    DOB: 03-03-1966, 52 y.o.   MRN: [id number]  HPI  Brandon Mckee is here for a testosterone injection. Denies chest pain, shortness of breath, headaches or mood changes.   Review of Systems     Objective:   Physical Exam        Assessment & Plan:  Patient tolerated injection well without complications. Patient advised to schedule next injection 14 days from today.   Agree with above plan. Iran Planas PA-c

## 2018-09-02 ENCOUNTER — Ambulatory Visit (INDEPENDENT_AMBULATORY_CARE_PROVIDER_SITE_OTHER): Payer: BLUE CROSS/BLUE SHIELD | Admitting: Physician Assistant

## 2018-09-02 VITALS — BP 132/73 | HR 65 | Wt 262.0 lb

## 2018-09-02 DIAGNOSIS — E291 Testicular hypofunction: Secondary | ICD-10-CM | POA: Diagnosis not present

## 2018-09-02 MED ORDER — TESTOSTERONE CYPIONATE 200 MG/ML IM SOLN
300.0000 mg | Freq: Once | INTRAMUSCULAR | Status: AC
  Administered 2018-09-02: 300 mg via INTRAMUSCULAR

## 2018-09-02 NOTE — Progress Notes (Signed)
   Subjective:    Patient ID: Brandon Mckee, male    DOB: June 19, 1966, 52 y.o.   MRN: [id number]  HPI  Brandon Mckee is here for a testosterone injection. Denies chest pain, shortness of breath, headaches or mood changes.   Review of Systems     Objective:   Physical Exam        Assessment & Plan:  Patient tolerated injection well without complications. Patient advised to schedule next injection 14 days from today.   Agree with above plan. Iran Planas PA-C

## 2018-09-16 ENCOUNTER — Ambulatory Visit (INDEPENDENT_AMBULATORY_CARE_PROVIDER_SITE_OTHER): Payer: BLUE CROSS/BLUE SHIELD | Admitting: Physician Assistant

## 2018-09-16 VITALS — BP 136/79 | HR 66 | Wt 261.0 lb

## 2018-09-16 DIAGNOSIS — E291 Testicular hypofunction: Secondary | ICD-10-CM

## 2018-09-16 MED ORDER — TESTOSTERONE CYPIONATE 200 MG/ML IM SOLN
300.0000 mg | INTRAMUSCULAR | Status: DC
  Administered 2018-09-16 – 2018-10-14 (×2): 300 mg via INTRAMUSCULAR

## 2018-09-16 NOTE — Progress Notes (Signed)
   Subjective:    Patient ID: Brandon Mckee, male    DOB: 09-Jan-1966, 52 y.o.   MRN: [id number]  HPI Patient in office today for testosterone injection. Patient has no complaints of SOB, dizziness, fatigue, palpitations or mood changes. Patient tolerated injection well without any complications. KG LPN   Review of Systems     Objective:   Physical Exam        Assessment & Plan:  Return in 2 weeks for next testosterone injection. KG LPN  Agree with above plan. Iran Planas PA-C

## 2018-09-30 ENCOUNTER — Ambulatory Visit (INDEPENDENT_AMBULATORY_CARE_PROVIDER_SITE_OTHER): Payer: BLUE CROSS/BLUE SHIELD | Admitting: Physician Assistant

## 2018-09-30 VITALS — BP 130/73 | HR 72 | Temp 97.8°F | Wt 255.0 lb

## 2018-09-30 DIAGNOSIS — E291 Testicular hypofunction: Secondary | ICD-10-CM | POA: Diagnosis not present

## 2018-09-30 MED ORDER — TESTOSTERONE CYPIONATE 200 MG/ML IM SOLN
300.0000 mg | Freq: Once | INTRAMUSCULAR | Status: AC
  Administered 2018-09-30: 300 mg via INTRAMUSCULAR

## 2018-09-30 NOTE — Progress Notes (Signed)
   Subjective:    Patient ID: Brandon Mckee, male    DOB: [birth year], 52 y.o.   MRN: [id number]  HPI Patient is here for a testosterone injection. Denies chest pain, shortness of breath, headaches, problems with mood change, or medication problems.   Review of Systems     Objective:   Physical Exam        Assessment & Plan:  Patient tolerated injection in [place] well without complications. Patient advised to schedule his next injection for 2 weeks from today.  Agree with above plan. Iran Planas PA-C

## 2018-10-14 ENCOUNTER — Ambulatory Visit: Payer: BLUE CROSS/BLUE SHIELD | Admitting: Physician Assistant

## 2018-10-14 VITALS — BP 130/70 | HR 67 | Temp 98.3°F

## 2018-10-14 DIAGNOSIS — E291 Testicular hypofunction: Secondary | ICD-10-CM | POA: Diagnosis not present

## 2018-10-14 MED ORDER — TESTOSTERONE CYPIONATE 200 MG/ML IM SOLN: 300.0000 mg | Freq: Once | INTRAMUSCULAR | Status: DC

## 2018-10-14 NOTE — Progress Notes (Signed)
Pt in today for testosterone injection. Medications and allergies reviewed. Denies chest pain, SOB, headaches and problems with medications or mood changes.Pt received testosterone in [place]  without complications. Patient advised to schedule next injection in 2 weeks.  Agree with above plan. Iran Planas PA-C

## 2018-10-28 ENCOUNTER — Ambulatory Visit (INDEPENDENT_AMBULATORY_CARE_PROVIDER_SITE_OTHER): Payer: BLUE CROSS/BLUE SHIELD | Admitting: Family Medicine

## 2018-10-28 VITALS — BP 140/79 | HR 66 | Wt 263.0 lb

## 2018-10-28 DIAGNOSIS — E291 Testicular hypofunction: Secondary | ICD-10-CM

## 2018-10-28 MED ORDER — TESTOSTERONE CYPIONATE 200 MG/ML IM SOLN
300.0000 mg | Freq: Once | INTRAMUSCULAR | Status: AC
  Administered 2018-10-28: 300 mg via INTRAMUSCULAR

## 2018-10-28 NOTE — Progress Notes (Signed)
Agree with documentation as above.   Amylynn Fano, MD  

## 2018-10-28 NOTE — Progress Notes (Signed)
   Subjective:    Patient ID: Brandon Mckee, male    DOB: 09-02-1966, 52 y.o.   MRN: [id number]  HPI  Brandon Mckee is here for a testosterone injection. Denies chest pain, shortness of breath, headaches or mood changes.   Review of Systems     Objective:   Physical Exam        Assessment & Plan:  Patient tolerated injection well without complications. Patient advised to schedule next injection 14 days from today.

## 2018-11-11 ENCOUNTER — Ambulatory Visit: Payer: BLUE CROSS/BLUE SHIELD | Admitting: Physician Assistant

## 2018-11-11 ENCOUNTER — Encounter: Payer: Self-pay | Admitting: Physician Assistant

## 2018-11-11 VITALS — BP 135/75 | HR 72 | Ht 71.0 in | Wt 263.0 lb

## 2018-11-11 DIAGNOSIS — M170 Bilateral primary osteoarthritis of knee: Secondary | ICD-10-CM | POA: Diagnosis not present

## 2018-11-11 DIAGNOSIS — R7303 Prediabetes: Secondary | ICD-10-CM | POA: Diagnosis not present

## 2018-11-11 DIAGNOSIS — E291 Testicular hypofunction: Secondary | ICD-10-CM

## 2018-11-11 DIAGNOSIS — I10 Essential (primary) hypertension: Secondary | ICD-10-CM | POA: Diagnosis not present

## 2018-11-11 DIAGNOSIS — G4733 Obstructive sleep apnea (adult) (pediatric): Secondary | ICD-10-CM | POA: Diagnosis not present

## 2018-11-11 DIAGNOSIS — E8881 Metabolic syndrome: Secondary | ICD-10-CM

## 2018-11-11 DIAGNOSIS — M1A09X0 Idiopathic chronic gout, multiple sites, without tophus (tophi): Secondary | ICD-10-CM

## 2018-11-11 LAB — POCT GLYCOSYLATED HEMOGLOBIN (HGB A1C): Hemoglobin A1C: 6.1 % — AB (ref 4.0–5.6)

## 2018-11-11 MED ORDER — ATORVASTATIN CALCIUM 40 MG PO TABS: 40.0000 mg | ORAL_TABLET | Freq: Every day | ORAL | 5 refills | Status: DC

## 2018-11-11 MED ORDER — CARVEDILOL 25 MG PO TABS: 25.0000 mg | ORAL_TABLET | Freq: Two times a day (BID) | ORAL | 5 refills | Status: DC

## 2018-11-11 MED ORDER — TESTOSTERONE CYPIONATE 200 MG/ML IM SOLN
300.0000 mg | Freq: Once | INTRAMUSCULAR | Status: AC
  Administered 2018-11-11: 300 mg via INTRAMUSCULAR

## 2018-11-11 MED ORDER — MELOXICAM 15 MG PO TABS: ORAL_TABLET | ORAL | 5 refills | Status: DC

## 2018-11-11 MED ORDER — COLCHICINE 0.6 MG PO TABS: ORAL_TABLET | ORAL | 0 refills | Status: DC

## 2018-11-11 MED ORDER — LISINOPRIL-HYDROCHLOROTHIAZIDE 20-25 MG PO TABS: 1.0000 | ORAL_TABLET | Freq: Every day | ORAL | 5 refills | Status: DC

## 2018-11-11 MED ORDER — AMLODIPINE BESYLATE 10 MG PO TABS: 10.0000 mg | ORAL_TABLET | Freq: Every day | ORAL | 5 refills | Status: DC

## 2018-11-11 NOTE — Progress Notes (Signed)
Subjective:    Patient ID: Brandon Mckee, male    DOB: 19-Feb-1966, 52 y.o.   MRN: [id number]  HPI Pt is a 52 yo male with pre diabetes, HTN, OSA, dyslipidemia, hypogonadism who presents to the clinic for medication management.   Pt admits he has stopped watching his diet and eating a lot more. His weight is up from 245 to 263. He is not checking his sugars. a1c was low last visit and he was losing weight and metformin was stopped. No hypoglycemia. No open sores or wounds.   Pt denies any CP, palpitations, headaches. He admits to not using CPap.   Overall he has no complaints.   He does admit to not taking allopurinol. No recent gout flares.   .. Active Ambulatory Problems    Diagnosis Date Noted  . B12 DEFICIENCY 06/30/2010  . HYPERLIPIDEMIA 12/08/2006  . DYSMETABOLIC SYNDROME 32/35/5732  . Class 2 severe obesity due to excess calories with serious comorbidity and body mass index (BMI) of 35.0 to 35.9 in adult ([place]) 12/08/2006  . HYPERTENSION, BENIGN 11/22/2006  . Osteoarthritis of both knees 12/27/2008  . FATIGUE 09/25/2008  . LEG EDEMA 05/03/2009  . Obstructive sleep apnea 10/11/2013  . Male hypogonadism 05/25/2014  . Pre-diabetes 12/07/2014  . Depression 01/03/2016  . Anxiety disorder 01/03/2016  . Dyslipidemia 01/07/2016  . Vitamin D deficiency 01/07/2016  . Osteoarthritis of right acromioclavicular joint 01/17/2016  . Microalbuminuria 04/01/2016  . Hypogonadism in male 07/03/2016  . Trapezius muscle spasm 11/24/2016  . Colon polyps 01/01/2017  . Diverticulosis of colon without hemorrhage 01/01/2017  . Benign prostatic hyperplasia without lower urinary tract symptoms 04/25/2017  . Grief reaction 08/09/2017  . Obesity (BMI 30-39.9) 09/10/2017  . Gastroesophageal reflux disease with esophagitis 05/13/2018  . Chronic gout of multiple sites 05/13/2018   Resolved Ambulatory Problems    Diagnosis Date Noted  . STYE 05/03/2009  . GASTROENTERITIS 01/03/2009  .  SKIN TAG 12/08/2006  . LATERAL EPICONDYLITIS, BILATERAL 01/31/2010  . Pes anserinus tendinitis or bursitis 12/31/2008  . Tinea cruris 06/06/2013  . Testicular hypofunction 11/09/2014  . Essential hypertension 01/03/2016   Past Medical History:  Diagnosis Date  . Hyperlipidemia   . Hypertension   . Metabolic syndrome   . Pes anserine bursitis       Review of Systems  All other systems reviewed and are negative.      Objective:   Physical Exam  Constitutional: He is oriented to person, place, and time. He appears well-developed and well-nourished.  HENT:  Head: Normocephalic and atraumatic.  Cardiovascular: Normal rate and regular rhythm.  Pulmonary/Chest: Effort normal and breath sounds normal.  Neurological: He is alert and oriented to person, place, and time.  Skin:  Acanthosis nigrans around neck.   Psychiatric: He has a normal mood and affect. His behavior is normal.          Assessment & Plan:  [name] KitchenMarland KitchenDiagnoses and all orders for this visit:  Pre-diabetes -     POCT glycosylated hemoglobin (Hb A1C)  HYPERTENSION, BENIGN  Obstructive sleep apnea  Primary osteoarthritis of both knees -     meloxicam (MOBIC) 15 MG tablet; TAKE 1 TABLET BY MOUTH IN THE MORNING WITH BREAKFAST FOR 2 WEEKS THEN 1 TAB DAILY AS NEEDED FOR PAIN  Essential hypertension -     carvedilol (COREG) 25 MG tablet; Take 1 tablet (25 mg total) by mouth 2 (two) times daily with a meal. -     amLODipine (NORVASC) 10  MG tablet; Take 1 tablet (10 mg total) by mouth daily. -     lisinopril-hydrochlorothiazide (PRINZIDE,ZESTORETIC) 20-25 MG tablet; Take 1 tablet by mouth daily.  DYSMETABOLIC SYNDROME -     atorvastatin (LIPITOR) 40 MG tablet; Take 1 tablet (40 mg total) by mouth daily.  Male hypogonadism -     CBC with Differential/Platelet -     PSA -     Testosterone -     testosterone cypionate (DEPOTESTOSTERONE CYPIONATE) injection 300 mg  Chronic gout of multiple sites, unspecified  cause -     colchicine 0.6 MG tablet; Take 2 tablets as needed for gout flare and repeat one hour later no longer than 3 days.  .. Results for orders placed or performed in visit on 11/11/18  POCT glycosylated hemoglobin (Hb A1C)  Result Value Ref Range   Hemoglobin A1C 6.1 (A) 4.0 - 5.6 %   HbA1c POC (<> result, manual entry)     HbA1c, POC (prediabetic range)     HbA1c, POC (controlled diabetic range)     A!C up from last visit.  Pt agress will watch his diet more.  Recheck in 3 months.  BP looks great.   Discussed gout. He would like to try acute colchine if he has attack and no more allopurinol. Ok to see how many exacerbations he has per year. He does not drink alcohol.   tesosterone shot given today. Ordered labs.   Follow up in 3 months.

## 2018-11-14 ENCOUNTER — Ambulatory Visit: Payer: BLUE CROSS/BLUE SHIELD | Admitting: Physician Assistant

## 2018-11-25 ENCOUNTER — Ambulatory Visit (INDEPENDENT_AMBULATORY_CARE_PROVIDER_SITE_OTHER): Payer: BLUE CROSS/BLUE SHIELD | Admitting: Physician Assistant

## 2018-11-25 VITALS — BP 140/89 | HR 75 | Temp 98.3°F | Wt 267.8 lb

## 2018-11-25 DIAGNOSIS — E291 Testicular hypofunction: Secondary | ICD-10-CM

## 2018-11-25 MED ORDER — TESTOSTERONE CYPIONATE 200 MG/ML IM SOLN
300.0000 mg | Freq: Once | INTRAMUSCULAR | Status: AC
  Administered 2018-11-25: 300 mg via INTRAMUSCULAR

## 2018-11-25 NOTE — Progress Notes (Signed)
Pt in today for testosterone injection. Medications and allergies reviewed. Pt denies chest pain, shortness of breath, headaches and denies problems with medication or mood changes. Pt received 300mg  of testosterone in [place] . Patient tolerated injection well without complications. Pt advised to schedule next injection in 14  days. Patients BP was elevated, but pt reported not taking his BP meds yesterday or today. Pt was advised he needed to take his BP meds.  Agree with above plan. Iran Planas PA-C

## 2018-12-09 ENCOUNTER — Ambulatory Visit (INDEPENDENT_AMBULATORY_CARE_PROVIDER_SITE_OTHER): Payer: BLUE CROSS/BLUE SHIELD | Admitting: Physician Assistant

## 2018-12-09 VITALS — BP 126/86 | HR 63 | Temp 97.3°F | Wt 267.0 lb

## 2018-12-09 DIAGNOSIS — E291 Testicular hypofunction: Secondary | ICD-10-CM | POA: Diagnosis not present

## 2018-12-09 MED ORDER — TESTOSTERONE CYPIONATE 200 MG/ML IM SOLN
300.0000 mg | Freq: Once | INTRAMUSCULAR | Status: AC
  Administered 2018-12-09: 300 mg via INTRAMUSCULAR

## 2018-12-09 NOTE — Progress Notes (Signed)
Pt in today for testosterone injection. Medications and allergies reviewed. Pt denies chest pain, shortness of breath, headaches and denies problems with medication or mood changes. Pt received 300mg  of testosterone in [place] . Patient tolerated injection well without complications. Pt advised to schedule next injection in 14  days.   Agree with above plan. Iran Planas PA-C

## 2018-12-23 ENCOUNTER — Ambulatory Visit (INDEPENDENT_AMBULATORY_CARE_PROVIDER_SITE_OTHER): Payer: BLUE CROSS/BLUE SHIELD | Admitting: Physician Assistant

## 2018-12-23 VITALS — BP 140/88 | HR 66 | Temp 98.5°F | Wt 270.0 lb

## 2018-12-23 DIAGNOSIS — E291 Testicular hypofunction: Secondary | ICD-10-CM

## 2018-12-23 MED ORDER — TESTOSTERONE CYPIONATE 200 MG/ML IM SOLN
300.0000 mg | Freq: Once | INTRAMUSCULAR | Status: AC
  Administered 2018-12-23: 300 mg via INTRAMUSCULAR

## 2018-12-23 NOTE — Progress Notes (Signed)
Pt in today for testosterone injection. Medications and allergies reviewed. Pt denies chest pain, shortness of breath, headaches and denies problems with medication or mood changes. Pt received testosterone in [place] . Patient tolerated injection well without complications. Pt advised to schedule next injection in 14 days.   Agree with the above plan. Iran Planas PA-C

## 2019-01-06 ENCOUNTER — Ambulatory Visit (INDEPENDENT_AMBULATORY_CARE_PROVIDER_SITE_OTHER): Payer: BLUE CROSS/BLUE SHIELD | Admitting: Physician Assistant

## 2019-01-06 VITALS — BP 140/89 | HR 67 | Temp 98.2°F | Wt 265.0 lb

## 2019-01-06 DIAGNOSIS — E291 Testicular hypofunction: Secondary | ICD-10-CM | POA: Diagnosis not present

## 2019-01-06 MED ORDER — TESTOSTERONE CYPIONATE 200 MG/ML IM SOLN
300.0000 mg | Freq: Once | INTRAMUSCULAR | Status: AC
  Administered 2019-01-06: 300 mg via INTRAMUSCULAR

## 2019-01-06 NOTE — Progress Notes (Signed)
Pt in today for testosterone injection. Medications and allergies reviewed. Pt denies chest pain, shortness of breath, headaches and denies problems with medication or mood changes. Pt received 300mg  testosterone in [place] .Patient tolerated injection well without complications. Pt advised to schedule next injection in 14 days.   Agree with above plan. Iran Planas PA-C

## 2019-01-20 ENCOUNTER — Ambulatory Visit (INDEPENDENT_AMBULATORY_CARE_PROVIDER_SITE_OTHER): Payer: BLUE CROSS/BLUE SHIELD | Admitting: Physician Assistant

## 2019-01-20 VITALS — BP 135/91 | HR 75 | Wt 265.0 lb

## 2019-01-20 DIAGNOSIS — E291 Testicular hypofunction: Secondary | ICD-10-CM | POA: Diagnosis not present

## 2019-01-20 MED ORDER — TESTOSTERONE CYPIONATE 200 MG/ML IM SOLN
300.0000 mg | Freq: Once | INTRAMUSCULAR | Status: AC
  Administered 2019-01-20: 300 mg via INTRAMUSCULAR

## 2019-01-20 NOTE — Progress Notes (Signed)
Patient ID: Brandon Mckee, male   DOB: [birth year], 53 y.o.   MRN: [id number]  Agree with the above plan. Iran Planas PA-C

## 2019-01-20 NOTE — Progress Notes (Signed)
Established Patient Office Visit  Subjective:  Patient ID: Brandon Mckee, male    DOB: 1966-09-26  Age: 53 y.o. MRN: [id number]  CC:  Chief Complaint  Patient presents with  . Hypogonadism    HPI Brandon Mckee is here for a testosterone injection. Denies chest pain, shortness of breath, headaches or mood changes.   Past Medical History:  Diagnosis Date  . Hyperlipidemia   . Hypertension   . Metabolic syndrome    IFG  . Pes anserine bursitis    bilateral    Past Surgical History:  Procedure Laterality Date  . APPENDECTOMY      Family History  Problem Relation Age of Onset  . Hyperlipidemia Mother   . Hypertension Mother   . Hypertension Father   . Hyperlipidemia Father   . Obesity Sister   . Hypertension Sister     Social History   Socioeconomic History  . Marital status: Married    Spouse name: Not on file  . Number of children: Not on file  . Years of education: Not on file  . Highest education level: Not on file  Occupational History  . Not on file  Social Needs  . Financial resource strain: Not on file  . Food insecurity:    Worry: Not on file    Inability: Not on file  . Transportation needs:    Medical: Not on file    Non-medical: Not on file  Tobacco Use  . Smoking status: Never Smoker  . Smokeless tobacco: Never Used  Substance and Sexual Activity  . Alcohol use: Not on file  . Drug use: Not on file  . Sexual activity: Not on file  Lifestyle  . Physical activity:    Days per week: Not on file    Minutes per session: Not on file  . Stress: Not on file  Relationships  . Social connections:    Talks on phone: Not on file    Gets together: Not on file    Attends religious service: Not on file    Active member of club or organization: Not on file    Attends meetings of clubs or organizations: Not on file    Relationship status: Not on file  . Intimate partner violence:    Fear of current or ex partner: Not on file   Emotionally abused: Not on file    Physically abused: Not on file    Forced sexual activity: Not on file  Other Topics Concern  . Not on file  Social History Narrative  . Not on file    Outpatient Medications Prior to Visit  Medication Sig Dispense Refill  . AMBULATORY NON FORMULARY MEDICATION Please evaluate pt for pressure change on CPAP. 1 application 0  . AMBULATORY NON FORMULARY MEDICATION Glucometer(relion) with test strips and lancets.   DM type II testing once daily. 1 Device 0  . AMBULATORY NON FORMULARY MEDICATION Continuous positive airway pressure (CPAP) mask and hose needed for machine. 1 each 0  . amLODipine (NORVASC) 10 MG tablet Take 1 tablet (10 mg total) by mouth daily. 30 tablet 5  . aspirin 81 MG EC tablet Take 81 mg by mouth daily.      [name] atorvastatin (LIPITOR) 40 MG tablet Take 1 tablet (40 mg total) by mouth daily. 30 tablet 5  . carvedilol (COREG) 25 MG tablet Take 1 tablet (25 mg total) by mouth 2 (two) times daily with a meal. 60 tablet 5  . colchicine  0.6 MG tablet Take 2 tablets as needed for gout flare and repeat one hour later no longer than 3 days. 60 tablet 0  . gabapentin (NEURONTIN) 300 MG capsule Take 1 capsule (300 mg total) by mouth at bedtime. 30 capsule 3  . lisinopril-hydrochlorothiazide (PRINZIDE,ZESTORETIC) 20-25 MG tablet Take 1 tablet by mouth daily. 30 tablet 5  . meloxicam (MOBIC) 15 MG tablet TAKE 1 TABLET BY MOUTH IN THE MORNING WITH BREAKFAST FOR 2 WEEKS THEN 1 TAB DAILY AS NEEDED FOR PAIN 30 tablet 5  . niacin (NIASPAN) 500 MG CR tablet Take 1 tablet (500 mg total) by mouth at bedtime. 90 tablet 1  . ranitidine (ZANTAC) 300 MG tablet Take 1 tablet (300 mg total) by mouth 2 (two) times daily. 180 tablet 1  . testosterone enanthate (DELATESTRYL) 200 MG/ML injection Inject 300 mg into the muscle every 14 (fourteen) days. For IM use only     No facility-administered medications prior to visit.     Allergies  Allergen Reactions  .  Penicillins     ROS Review of Systems    Objective:    Physical Exam  BP (!) 148/89   Pulse 75   Wt 265 lb (120.2 kg)   SpO2 97%   BMI 36.96 kg/m  Wt Readings from Last 3 Encounters:  01/20/19 265 lb (120.2 kg)  01/06/19 265 lb (120.2 kg)  12/23/18 270 lb (122.5 kg)     There are no preventive care reminders to display for this patient.  There are no preventive care reminders to display for this patient.  Lab Results  Component Value Date   TSH 1.073 06/27/2010   Lab Results  Component Value Date   WBC 7.1 02/11/2018   HGB 16.9 02/11/2018   HCT 48.1 02/11/2018   MCV 84.7 02/11/2018   PLT 216 02/11/2018   Lab Results  Component Value Date   NA 141 05/27/2018   K 3.6 05/27/2018   CO2 33 (H) 05/27/2018   GLUCOSE 112 (H) 05/27/2018   BUN 16 05/27/2018   CREATININE 1.14 05/27/2018   BILITOT 0.8 05/27/2018   ALKPHOS 110 01/22/2017   AST 15 05/27/2018   ALT 17 05/27/2018   PROT 6.6 05/27/2018   ALBUMIN 4.2 01/22/2017   CALCIUM 8.9 05/27/2018   Lab Results  Component Value Date   CHOL 181 05/27/2018   Lab Results  Component Value Date   HDL 25 (L) 05/27/2018   Lab Results  Component Value Date   LDLCALC 122 (H) 05/27/2018   Lab Results  Component Value Date   TRIG 217 (H) 05/27/2018   Lab Results  Component Value Date   CHOLHDL 7.2 (H) 05/27/2018   Lab Results  Component Value Date   HGBA1C 6.1 (A) 11/11/2018      Assessment & Plan:  Patient tolerated injection well without complications. Patient advised to schedule next injection 14 days from today.    Problem List Items Addressed This Visit    Male hypogonadism - Primary   Relevant Medications   testosterone cypionate (DEPOTESTOSTERONE CYPIONATE) injection 300 mg (Start on 01/20/2019  9:15 AM)      Meds ordered this encounter  Medications  . testosterone cypionate (DEPOTESTOSTERONE CYPIONATE) injection 300 mg    Follow-up: Return in about 2 weeks (around 02/03/2019) for  testosterone injection.Durene Romans, Monico Blitz, [place]

## 2019-02-03 ENCOUNTER — Ambulatory Visit (INDEPENDENT_AMBULATORY_CARE_PROVIDER_SITE_OTHER): Payer: BLUE CROSS/BLUE SHIELD | Admitting: Physician Assistant

## 2019-02-03 VITALS — BP 131/89 | HR 64 | Temp 98.2°F | Wt 267.0 lb

## 2019-02-03 DIAGNOSIS — E291 Testicular hypofunction: Secondary | ICD-10-CM

## 2019-02-03 MED ORDER — TESTOSTERONE CYPIONATE 200 MG/ML IM SOLN
300.0000 mg | Freq: Once | INTRAMUSCULAR | Status: AC
  Administered 2019-02-03: 300 mg via INTRAMUSCULAR

## 2019-02-03 NOTE — Progress Notes (Signed)
Pt in today for testosterone injection. Medications and allergies reviewed. Pt denies chest pain, shortness of breath, headaches and denies problems with medication or mood changes. Pt received 300 testosterone in RUOQ . Patient tolerated injection well without complications. Pt advised to schedule next injection in 14  days.   Agree with above plan. Iran Planas PA-C

## 2019-02-17 ENCOUNTER — Ambulatory Visit (INDEPENDENT_AMBULATORY_CARE_PROVIDER_SITE_OTHER): Payer: BLUE CROSS/BLUE SHIELD | Admitting: Physician Assistant

## 2019-02-17 ENCOUNTER — Encounter: Payer: Self-pay | Admitting: Physician Assistant

## 2019-02-17 VITALS — BP 142/86 | HR 59 | Temp 97.6°F | Wt 264.0 lb

## 2019-02-17 DIAGNOSIS — R7303 Prediabetes: Secondary | ICD-10-CM

## 2019-02-17 DIAGNOSIS — E669 Obesity, unspecified: Secondary | ICD-10-CM

## 2019-02-17 DIAGNOSIS — I10 Essential (primary) hypertension: Secondary | ICD-10-CM

## 2019-02-17 DIAGNOSIS — E291 Testicular hypofunction: Secondary | ICD-10-CM | POA: Diagnosis not present

## 2019-02-17 DIAGNOSIS — E8881 Metabolic syndrome: Secondary | ICD-10-CM

## 2019-02-17 LAB — POCT GLYCOSYLATED HEMOGLOBIN (HGB A1C): Hemoglobin A1C: 6.3 % — AB (ref 4.0–5.6)

## 2019-02-17 MED ORDER — TESTOSTERONE CYPIONATE 200 MG/ML IM SOLN
300.0000 mg | Freq: Once | INTRAMUSCULAR | Status: AC
  Administered 2019-02-17: 300 mg via INTRAMUSCULAR

## 2019-02-17 NOTE — Progress Notes (Signed)
Subjective:    Patient ID: Brandon Mckee, male    DOB: 1966-10-07, 53 y.o.   MRN: [id number]  HPI  Patient is a 53 year old obese male with hypogonadism, hypertension, dyslipidemia, prediabetes who presents to the clinic for 30-month follow-up.  Overall patient reports to be doing really well.  He feels like his energy is adequate.  He denies any shortness of breath, chest pains, palpitations.  He had previously done really well and lost a significant amount of weight.  Over the past month he has slowly been gaining weight.  Over the last 2 weeks he is actually started to lose weight again.  He is very motivated to keep losing weight.  Patient is currently off all diabetic medications and we are monitoring his A1c. He does not check his sugars.   .. Active Ambulatory Problems    Diagnosis Date Noted  . B12 DEFICIENCY 06/30/2010  . HYPERLIPIDEMIA 12/08/2006  . DYSMETABOLIC SYNDROME 94/85/4627  . Class 2 severe obesity due to excess calories with serious comorbidity and body mass index (BMI) of 35.0 to 35.9 in adult ([place]) 12/08/2006  . HYPERTENSION, BENIGN 11/22/2006  . Osteoarthritis of both knees 12/27/2008  . FATIGUE 09/25/2008  . LEG EDEMA 05/03/2009  . Obstructive sleep apnea 10/11/2013  . Male hypogonadism 05/25/2014  . Pre-diabetes 12/07/2014  . Depression 01/03/2016  . Anxiety disorder 01/03/2016  . Dyslipidemia 01/07/2016  . Vitamin D deficiency 01/07/2016  . Osteoarthritis of right acromioclavicular joint 01/17/2016  . Microalbuminuria 04/01/2016  . Hypogonadism in male 07/03/2016  . Trapezius muscle spasm 11/24/2016  . Colon polyps 01/01/2017  . Diverticulosis of colon without hemorrhage 01/01/2017  . Benign prostatic hyperplasia without lower urinary tract symptoms 04/25/2017  . Grief reaction 08/09/2017  . Obesity (BMI 30-39.9) 09/10/2017  . Gastroesophageal reflux disease with esophagitis 05/13/2018  . Chronic gout of multiple sites 05/13/2018    Resolved Ambulatory Problems    Diagnosis Date Noted  . STYE 05/03/2009  . GASTROENTERITIS 01/03/2009  . SKIN TAG 12/08/2006  . LATERAL EPICONDYLITIS, BILATERAL 01/31/2010  . Pes anserinus tendinitis or bursitis 12/31/2008  . Tinea cruris 06/06/2013  . Testicular hypofunction 11/09/2014  . Essential hypertension 01/03/2016   Past Medical History:  Diagnosis Date  . Hyperlipidemia   . Hypertension   . Metabolic syndrome   . Pes anserine bursitis      Review of Systems See HPI.     Objective:   Physical Exam Vitals signs reviewed.  Constitutional:      Appearance: He is obese.  HENT:     Head: Normocephalic.  Cardiovascular:     Rate and Rhythm: Normal rate and regular rhythm.     Pulses: Normal pulses.     Heart sounds: Normal heart sounds.  Pulmonary:     Effort: Pulmonary effort is normal.     Breath sounds: Normal breath sounds.  Neurological:     General: No focal deficit present.     Mental Status: He is alert and oriented to person, place, and time.  Psychiatric:        Mood and Affect: Mood normal.        Behavior: Behavior normal.           Assessment & Plan:  [name] KitchenMarland KitchenAshar was seen today for follow-up.  Diagnoses and all orders for this visit:  Pre-diabetes -     Cancel: POCT hemoglobin -     POCT HgB A1C -     COMPLETE METABOLIC PANEL WITH GFR  Male hypogonadism -     testosterone cypionate (DEPOTESTOSTERONE CYPIONATE) injection 300 mg -     Testosterone -     PSA -     CBC -     COMPLETE METABOLIC PANEL WITH GFR  HYPERTENSION, BENIGN  Obesity (BMI 30-39.9)  DYSMETABOLIC SYNDROME     .[name] Lab Results  Component Value Date   HGBA1C 6.3 (A) 02/17/2019   A!C is stable but very close to diabetic range.  Discussed starting medication that could help him lose weight and lower his A1c.  He declined at this time.  He would like to continue working on weight loss and lifestyle changes.  Patient will continue on Lipitor for his elevated  cholesterol.  We will get PSA, CBC, CMP, testosterone to evaluate his hypogonadism.  He did receive a testosterone shot today.  Encouraged him to get the labs 1 week later.  BP not controlled. He admits to just taking BP medication. Discussed 3 months of low salt diet and weight changes. If no improvement will need to make medication changes. EXERCISE.   Follow up in 3 months.

## 2019-02-20 ENCOUNTER — Encounter: Payer: Self-pay | Admitting: Physician Assistant

## 2019-03-03 ENCOUNTER — Other Ambulatory Visit: Payer: Self-pay

## 2019-03-03 ENCOUNTER — Ambulatory Visit (INDEPENDENT_AMBULATORY_CARE_PROVIDER_SITE_OTHER): Payer: BLUE CROSS/BLUE SHIELD | Admitting: Physician Assistant

## 2019-03-03 VITALS — BP 126/75 | HR 65 | Wt 261.0 lb

## 2019-03-03 DIAGNOSIS — E291 Testicular hypofunction: Secondary | ICD-10-CM

## 2019-03-03 MED ORDER — TESTOSTERONE CYPIONATE 200 MG/ML IM SOLN
300.0000 mg | Freq: Once | INTRAMUSCULAR | Status: AC
  Administered 2019-03-03: 300 mg via INTRAMUSCULAR

## 2019-03-03 NOTE — Progress Notes (Signed)
Established Patient Office Visit  Subjective:  Patient ID: Brandon Mckee, male    DOB: [birth year]  Age: 53 y.o. MRN: [id number]  CC:  Chief Complaint  Patient presents with  . Hypogonadism    HPI  Brandon Mckee is here for a testosterone injection. Denies chest pain, shortness of breath, headaches or mood changes.   Past Medical History:  Diagnosis Date  . Hyperlipidemia   . Hypertension   . Metabolic syndrome    IFG  . Pes anserine bursitis    bilateral    Past Surgical History:  Procedure Laterality Date  . APPENDECTOMY      Family History  Problem Relation Age of Onset  . Hyperlipidemia Mother   . Hypertension Mother   . Hypertension Father   . Hyperlipidemia Father   . Obesity Sister   . Hypertension Sister     Social History   Socioeconomic History  . Marital status: Married    Spouse name: Not on file  . Number of children: Not on file  . Years of education: Not on file  . Highest education level: Not on file  Occupational History  . Not on file  Social Needs  . Financial resource strain: Not on file  . Food insecurity:    Worry: Not on file    Inability: Not on file  . Transportation needs:    Medical: Not on file    Non-medical: Not on file  Tobacco Use  . Smoking status: Never Smoker  . Smokeless tobacco: Never Used  Substance and Sexual Activity  . Alcohol use: Not on file  . Drug use: Not on file  . Sexual activity: Not on file  Lifestyle  . Physical activity:    Days per week: Not on file    Minutes per session: Not on file  . Stress: Not on file  Relationships  . Social connections:    Talks on phone: Not on file    Gets together: Not on file    Attends religious service: Not on file    Active member of club or organization: Not on file    Attends meetings of clubs or organizations: Not on file    Relationship status: Not on file  . Intimate partner violence:    Fear of current or ex partner: Not on file   Emotionally abused: Not on file    Physically abused: Not on file    Forced sexual activity: Not on file  Other Topics Concern  . Not on file  Social History Narrative  . Not on file    Outpatient Medications Prior to Visit  Medication Sig Dispense Refill  . AMBULATORY NON FORMULARY MEDICATION Please evaluate pt for pressure change on CPAP. 1 application 0  . AMBULATORY NON FORMULARY MEDICATION Glucometer(relion) with test strips and lancets.   DM type II testing once daily. 1 Device 0  . AMBULATORY NON FORMULARY MEDICATION Continuous positive airway pressure (CPAP) mask and hose needed for machine. 1 each 0  . amLODipine (NORVASC) 10 MG tablet Take 1 tablet (10 mg total) by mouth daily. 30 tablet 5  . aspirin 81 MG EC tablet Take 81 mg by mouth daily.      [name] atorvastatin (LIPITOR) 40 MG tablet Take 1 tablet (40 mg total) by mouth daily. 30 tablet 5  . carvedilol (COREG) 25 MG tablet Take 1 tablet (25 mg total) by mouth 2 (two) times daily with a meal. 60 tablet 5  .  colchicine 0.6 MG tablet Take 2 tablets as needed for gout flare and repeat one hour later no longer than 3 days. 60 tablet 0  . gabapentin (NEURONTIN) 300 MG capsule Take 1 capsule (300 mg total) by mouth at bedtime. 30 capsule 3  . lisinopril-hydrochlorothiazide (PRINZIDE,ZESTORETIC) 20-25 MG tablet Take 1 tablet by mouth daily. 30 tablet 5  . meloxicam (MOBIC) 15 MG tablet TAKE 1 TABLET BY MOUTH IN THE MORNING WITH BREAKFAST FOR 2 WEEKS THEN 1 TAB DAILY AS NEEDED FOR PAIN 30 tablet 5  . niacin (NIASPAN) 500 MG CR tablet Take 1 tablet (500 mg total) by mouth at bedtime. 90 tablet 1  . ranitidine (ZANTAC) 300 MG tablet Take 1 tablet (300 mg total) by mouth 2 (two) times daily. 180 tablet 1  . testosterone enanthate (DELATESTRYL) 200 MG/ML injection Inject 300 mg into the muscle every 14 (fourteen) days. For IM use only     No facility-administered medications prior to visit.     Allergies  Allergen Reactions  .  Penicillins     ROS Review of Systems    Objective:    Physical Exam  BP 126/75   Pulse 65   Wt 261 lb (118.4 kg)   SpO2 97%   BMI 36.40 kg/m  Wt Readings from Last 3 Encounters:  03/03/19 261 lb (118.4 kg)  02/17/19 264 lb (119.7 kg)  02/03/19 267 lb (121.1 kg)     Health Maintenance Due  Topic Date Due  . HIV Screening  10/17/1981    There are no preventive care reminders to display for this patient.  Lab Results  Component Value Date   TSH 1.073 06/27/2010   Lab Results  Component Value Date   WBC 7.1 02/11/2018   HGB 16.9 02/11/2018   HCT 48.1 02/11/2018   MCV 84.7 02/11/2018   PLT 216 02/11/2018   Lab Results  Component Value Date   NA 141 05/27/2018   K 3.6 05/27/2018   CO2 33 (H) 05/27/2018   GLUCOSE 112 (H) 05/27/2018   BUN 16 05/27/2018   CREATININE 1.14 05/27/2018   BILITOT 0.8 05/27/2018   ALKPHOS 110 01/22/2017   AST 15 05/27/2018   ALT 17 05/27/2018   PROT 6.6 05/27/2018   ALBUMIN 4.2 01/22/2017   CALCIUM 8.9 05/27/2018   Lab Results  Component Value Date   CHOL 181 05/27/2018   Lab Results  Component Value Date   HDL 25 (L) 05/27/2018   Lab Results  Component Value Date   LDLCALC 122 (H) 05/27/2018   Lab Results  Component Value Date   TRIG 217 (H) 05/27/2018   Lab Results  Component Value Date   CHOLHDL 7.2 (H) 05/27/2018   Lab Results  Component Value Date   HGBA1C 6.3 (A) 02/17/2019      Assessment & Plan:  Patient tolerated injection well without complications. Patient advised to schedule next injection 14 days from today.    Problem List Items Addressed This Visit    Male hypogonadism - Primary   Relevant Medications   testosterone cypionate (DEPOTESTOSTERONE CYPIONATE) injection 300 mg (Completed) (Start on 03/03/2019  9:30 AM)      Meds ordered this encounter  Medications  . testosterone cypionate (DEPOTESTOSTERONE CYPIONATE) injection 300 mg    Follow-up: Return in about 2 weeks (around  03/17/2019) for testosterone injection. Durene Romans, Monico Blitz, [place]

## 2019-03-03 NOTE — Progress Notes (Signed)
Patient ID: Brandon Mckee, male   DOB: 07/14/1966, 53 y.o.   MRN: [id number] Agree with above plan.

## 2019-05-19 ENCOUNTER — Ambulatory Visit: Payer: BLUE CROSS/BLUE SHIELD | Admitting: Physician Assistant

## 2020-07-16 ENCOUNTER — Encounter: Payer: Self-pay | Admitting: Family Medicine

## 2020-07-16 ENCOUNTER — Other Ambulatory Visit: Payer: Self-pay

## 2020-07-16 ENCOUNTER — Ambulatory Visit (INDEPENDENT_AMBULATORY_CARE_PROVIDER_SITE_OTHER): Payer: Self-pay | Admitting: Family Medicine

## 2020-07-16 DIAGNOSIS — I10 Essential (primary) hypertension: Secondary | ICD-10-CM

## 2020-07-16 DIAGNOSIS — T7840XA Allergy, unspecified, initial encounter: Secondary | ICD-10-CM

## 2020-07-16 MED ORDER — PREDNISONE 10 MG (21) PO TBPK: ORAL_TABLET | ORAL | 0 refills | Status: DC

## 2020-07-16 MED ORDER — LISINOPRIL-HYDROCHLOROTHIAZIDE 20-25 MG PO TABS: 0.5000 | ORAL_TABLET | Freq: Every day | ORAL | 1 refills | Status: DC

## 2020-07-16 MED ORDER — PREDNISONE 5 MG (48) PO TBPK: ORAL_TABLET | ORAL | 0 refills | Status: DC

## 2020-07-16 NOTE — Progress Notes (Signed)
EVERETT EHRLER - 54 y.o. male MRN [id number]  Date of birth: 02-14-1966  Subjective Chief Complaint  Patient presents with  . Allergic Reaction    HPI MINGO SIEGERT is a 54 y.o. male with history of HTN, HLD, OSA and gout here today with complaint of itching and swelling of hands and eyes.  He reports that he was cutting grass and someone was burning brush that he believes had poison oak.  A day or so afterwards he started noticing swelling and itching in his hands and legs.  He then started having watering and swelling of his eyes.  He started benadryl which helped but hasn't fully resolved.  He did notice some mild airway irritation but denies swollen feeling, difficulty swallowing or shortness of breath.   He has been off all medications for >1 year.  Reports that he lost his insurance last year and never got any refills.  BP elevated today.  Denies chest pain, shortness of breath, palpitations, headache.    ROS:  A comprehensive ROS was completed and negative except as noted per HPI  Allergies  Allergen Reactions  . Penicillins     Past Medical History:  Diagnosis Date  . Hyperlipidemia   . Hypertension   . Metabolic syndrome    IFG  . Pes anserine bursitis    bilateral    Past Surgical History:  Procedure Laterality Date  . APPENDECTOMY      Social History   Socioeconomic History  . Marital status: Married    Spouse name: Not on file  . Number of children: Not on file  . Years of education: Not on file  . Highest education level: Not on file  Occupational History  . Not on file  Tobacco Use  . Smoking status: Never Smoker  . Smokeless tobacco: Never Used  Substance and Sexual Activity  . Alcohol use: Not on file  . Drug use: Not on file  . Sexual activity: Not on file  Other Topics Concern  . Not on file  Social History Narrative  . Not on file   Social Determinants of Health   Financial Resource Strain:   . Difficulty of Paying Living  Expenses:   Food Insecurity:   . Worried About Charity fundraiser in the Last Year:   . Arboriculturist in the Last Year:   Transportation Needs:   . Film/video editor (Medical):   [name] Lack of Transportation (Non-Medical):   Physical Activity:   . Days of Exercise per Week:   . Minutes of Exercise per Session:   Stress:   . Feeling of Stress :   Social Connections:   . Frequency of Communication with Friends and Family:   . Frequency of Social Gatherings with Friends and Family:   . Attends Religious Services:   . Active Member of Clubs or Organizations:   . Attends Archivist Meetings:   [name] Marital Status:     Family History  Problem Relation Age of Onset  . Hyperlipidemia Mother   . Hypertension Mother   . Hypertension Father   . Hyperlipidemia Father   . Obesity Sister   . Hypertension Sister     Health Maintenance  Topic Date Due  . COVID-19 Vaccine (1) Never done  . HIV Screening  Never done  . TETANUS/TDAP  09/27/2019  . COLONOSCOPY  01/01/2020  . INFLUENZA VACCINE  07/21/2020  . Hepatitis C Screening  Completed     -----------------------------------------------------------------------------------------------------------------------------------------------------------------------------------------------------------------  Physical Exam BP (!) 148/80 (BP Location: Left Arm, Patient Position: Sitting, Cuff Size: Large)   Pulse 85   Temp 98.1 F (36.7 C) (Temporal)   Ht 5' 10.87" (1.8 m)   Wt (!) 275 lb (124.7 kg)   SpO2 98%   BMI 38.50 kg/m   Physical Exam Constitutional:      Appearance: Normal appearance.  Eyes:     General: No scleral icterus.    Comments: Upper eyelid edema, worse on R.  Clear discharge without conjunctival injection.   Cardiovascular:     Rate and Rhythm: Normal rate and regular rhythm.  Pulmonary:     Effort: Pulmonary effort is normal.     Breath sounds: Normal breath sounds.  Musculoskeletal:     Cervical  back: Neck supple.     Comments: Mild swelling of hands and lower extremities with mild erythema.     Neurological:     General: No focal deficit present.     Mental Status: He is alert.  Psychiatric:        Mood and Affect: Mood normal.        Behavior: Behavior normal.     ------------------------------------------------------------------------------------------------------------------------------------------------------------------------------------------------------------------- Assessment and Plan  Essential hypertension Blood pressure is not at goal at for age and co-morbidities.  I recommend that he restart lisinopril/hctz 20/25mg , 1/2 tab daily.  In addition they were instructed to follow a low sodium diet with regular exercise to help to maintain adequate control of blood pressure.   Check BMP  F/u w/ PCP in about 4 weeks.    Allergic reaction Symptoms improved from initial onset.  Will add prednisone taper.  Instructed to return if symptoms are not resolving or if symptoms change or worsen.  He expresses understanding.     Meds ordered this encounter  Medications  . lisinopril-hydrochlorothiazide (ZESTORETIC) 20-25 MG tablet    Sig: Take 0.5 tablets by mouth daily.    Dispense:  30 tablet    Refill:  1  . predniSONE (STERAPRED UNI-PAK 48 TAB) 5 MG (48) TBPK tablet    Sig: Taper as directed on packaging    Dispense:  21 tablet    Refill:  0    No follow-ups on file.    This visit occurred during the SARS-CoV-2 public health emergency.  Safety protocols were in place, including screening questions prior to the visit, additional usage of staff PPE, and extensive cleaning of exam room while observing appropriate contact time as indicated for disinfecting solutions.

## 2020-07-16 NOTE — Addendum Note (Signed)
Addended by: Perlie Mayo on: 07/16/2020 04:22 PM   Modules accepted: Orders

## 2020-07-16 NOTE — Patient Instructions (Signed)
Start lisinopril-hctz for blood pressure Stop by the lab today Follow up with Jade in 3-4 weeks.   Take prednisone for poison ivy.    Poison Ivy Dermatitis Poison ivy dermatitis is inflammation of the skin that is caused by chemicals in the leaves of the poison ivy plant. The skin reaction often involves redness, swelling, blisters, and extreme itching. What are the causes? This condition is caused by a chemical (urushiol) found in the sap of the poison ivy plant. This chemical is sticky and can be easily spread to people, animals, and objects. You can get poison ivy dermatitis by:  Having direct contact with a poison ivy plant.  Touching animals, other people, or objects that have come in contact with poison ivy and have the chemical on them. What increases the risk? This condition is more likely to develop in people who:  Are outdoors often in wooded or [place] areas.  Go outdoors without wearing protective clothing, such as closed shoes, long pants, and a long-sleeved shirt. What are the signs or symptoms? Symptoms of this condition include:  Redness of the skin.  Extreme itching.  A rash that often includes bumps and blisters. The rash usually appears 48 hours after exposure, if you have been exposed before. If this is the first time you have been exposed, the rash may not appear until a week after exposure.  Swelling. This may occur if the reaction is more severe. Symptoms usually last for 1-2 weeks. However, the first time you develop this condition, symptoms may last 3-4 weeks. How is this diagnosed? This condition may be diagnosed based on your symptoms and a physical exam. Your health care provider may also ask you about any recent outdoor activity. How is this treated? Treatment for this condition will vary depending on how severe it is. Treatment may include:  Hydrocortisone cream or calamine lotion to relieve itching.  Oatmeal baths to soothe the skin.  Medicines,  such as over-the-counter antihistamine tablets.  Oral steroid medicine, for more severe reactions. Follow these instructions at home: Medicines  Take or apply over-the-counter and prescription medicines only as told by your health care provider.  Use hydrocortisone cream or calamine lotion as needed to soothe the skin and relieve itching. General instructions  Do not scratch or rub your skin.  Apply a cold, wet cloth (cold compress) to the affected areas or take baths in cool water. This will help with itching. Avoid hot baths and showers.  Take oatmeal baths as needed. Use colloidal oatmeal. You can get this at your local pharmacy or grocery store. Follow the instructions on the packaging.  While you have the rash, wash clothes right after you wear them.  Keep all follow-up visits as told by your health care provider. This is important. How is this prevented?   Learn to identify the poison ivy plant and avoid contact with the plant. This plant can be recognized by the number of leaves. Generally, poison ivy has three leaves with flowering branches on a single stem. The leaves are typically glossy, and they have jagged edges that come to a point at the front.  If you have been exposed to poison ivy, thoroughly wash with soap and water right away. You have about 30 minutes to remove the plant resin before it will cause the rash. Be sure to wash under your fingernails, because any plant resin there will continue to spread the rash.  When hiking or camping, wear clothes that will help you to  avoid exposure on the skin. This includes long pants, a long-sleeved shirt, tall socks, and hiking boots. You can also apply preventive lotion to your skin to help limit exposure.  If you suspect that your clothes or outdoor gear came in contact with poison ivy, rinse them off outside with a garden hose before you bring them inside your house.  When doing yard work or gardening, wear gloves, long  sleeves, long pants, and boots. Wash your garden tools and gloves if they come in contact with poison ivy.  If you suspect that your pet has come into contact with poison ivy, wash him or her with pet shampoo and water. Make sure to wear gloves while washing your pet. Contact a health care provider if you have:  Open sores in the rash area.  More redness, swelling, or pain in the affected area.  Redness that spreads beyond the rash area.  Fluid, blood, or pus coming from the affected area.  A fever.  A rash over a large area of your body.  A rash on your eyes, mouth, or genitals.  A rash that does not improve after a few weeks. Get help right away if:  Your face swells or your eyes swell shut.  You have trouble breathing.  You have trouble swallowing. These symptoms may represent a serious problem that is an emergency. Do not wait to see if the symptoms will go away. Get medical help right away. Call your local emergency services (911 in the [place] Do not drive yourself to the hospital. Summary  Poison ivy dermatitis is inflammation of the skin that is caused by chemicals in the leaves of the poison ivy plant.  Symptoms of this condition include redness, itching, a rash, and swelling.  Do not scratch or rub your skin.  Take or apply over-the-counter and prescription medicines only as told by your health care provider. This information is not intended to replace advice given to you by your health care provider. Make sure you discuss any questions you have with your health care provider. Document Revised: 03/31/2019 Document Reviewed: 12/02/2018 Elsevier Patient Education  2020 [employer].

## 2020-07-16 NOTE — Assessment & Plan Note (Signed)
Symptoms improved from initial onset.  Will add prednisone taper.  Instructed to return if symptoms are not resolving or if symptoms change or worsen.  He expresses understanding.

## 2020-07-16 NOTE — Assessment & Plan Note (Addendum)
Blood pressure is not at goal at for age and co-morbidities.  I recommend that he restart lisinopril/hctz 20/25mg , 1/2 tab daily.  In addition they were instructed to follow a low sodium diet with regular exercise to help to maintain adequate control of blood pressure.   Check BMP  F/u w/ PCP in about 4 weeks.

## 2020-07-17 LAB — COMPLETE METABOLIC PANEL WITH GFR
AG Ratio: 1.9 (calc) (ref 1.0–2.5)
ALT: 16 U/L (ref 9–46)
AST: 25 U/L (ref 10–35)
Albumin: 3.7 g/dL (ref 3.6–5.1)
Alkaline phosphatase (APISO): 74 U/L (ref 35–144)
BUN: 9 mg/dL (ref 7–25)
CO2: 29 mmol/L (ref 20–32)
Calcium: 8.3 mg/dL — ABNORMAL LOW (ref 8.6–10.3)
Chloride: 105 mmol/L (ref 98–110)
Creat: 0.9 mg/dL (ref 0.70–1.33)
GFR, Est African American: 113 mL/min/{1.73_m2} (ref >=60)
GFR, Est Non African American: 97 mL/min/{1.73_m2} (ref >=60)
Globulin: 2 g/dL (calc) (ref 1.9–3.7)
Glucose, Bld: 156 mg/dL — ABNORMAL HIGH (ref 65–99)
Potassium: 3.5 mmol/L (ref 3.5–5.3)
Sodium: 142 mmol/L (ref 135–146)
Total Bilirubin: 0.7 mg/dL (ref 0.2–1.2)
Total Protein: 5.7 g/dL — ABNORMAL LOW (ref 6.1–8.1)

## 2020-08-09 ENCOUNTER — Ambulatory Visit: Payer: Self-pay | Admitting: Physician Assistant

## 2022-04-01 ENCOUNTER — Telehealth: Payer: Self-pay | Admitting: General Practice

## 2022-04-01 NOTE — Telephone Encounter (Signed)
Transition Care Management Unsuccessful Follow-up Telephone Call ? ?Date of discharge and from where:  03/30/22 from [place] ? ?Attempts:  1st Attempt ? ?Reason for unsuccessful TCM follow-up call:  Voice mail full ? ?  ?

## 2022-04-06 NOTE — Telephone Encounter (Signed)
Transition Care Management Unsuccessful Follow-up Telephone Call ? ?Date of discharge and from where:  03/30/22 from [place] ? ?Attempts:  2nd Attempt ? ?Reason for unsuccessful TCM follow-up call:  Voice mail full ? ?  ?

## 2022-04-08 NOTE — Telephone Encounter (Signed)
Transition Care Management Unsuccessful Follow-up Telephone Call ? ?Date of discharge and from where:  03/30/22 from [place] ? ?Attempts:  3rd Attempt ? ?Reason for unsuccessful TCM follow-up call:  Voice mail full ? ?  ?

## 2022-04-16 ENCOUNTER — Telehealth: Payer: Self-pay | Admitting: Physician Assistant

## 2022-04-16 NOTE — Telephone Encounter (Signed)
Daughter called. Patient is almost out of his bp meds.  He was in the ER 2 wks ago for pain in his left elbow. He has an appointment scheduled for May 5th for pain in his rt foot. ?

## 2022-04-16 NOTE — Telephone Encounter (Signed)
Patient saw Dr. Zigmund Daniel in July 2021, has not seen Kilyn Maragh in over 3 years. Please advise?  ?

## 2022-04-17 ENCOUNTER — Other Ambulatory Visit: Payer: Self-pay | Admitting: Physician Assistant

## 2022-04-17 DIAGNOSIS — I10 Essential (primary) hypertension: Secondary | ICD-10-CM

## 2022-04-17 MED ORDER — AMLODIPINE BESYLATE 10 MG PO TABS: 10.0000 mg | ORAL_TABLET | Freq: Every day | ORAL | 0 refills | Status: DC

## 2022-04-17 MED ORDER — CARVEDILOL 25 MG PO TABS: 25.0000 mg | ORAL_TABLET | Freq: Two times a day (BID) | ORAL | 0 refills | Status: DC

## 2022-04-17 NOTE — Telephone Encounter (Signed)
LVM letting them know medication sent for one week and needs to keep appt.  ?

## 2022-04-24 ENCOUNTER — Encounter: Payer: Self-pay | Admitting: Physician Assistant

## 2022-04-24 ENCOUNTER — Ambulatory Visit (INDEPENDENT_AMBULATORY_CARE_PROVIDER_SITE_OTHER): Payer: Self-pay | Admitting: Physician Assistant

## 2022-04-24 VITALS — BP 214/109 | HR 83 | Ht 70.0 in | Wt 260.0 lb

## 2022-04-24 DIAGNOSIS — E291 Testicular hypofunction: Secondary | ICD-10-CM

## 2022-04-24 DIAGNOSIS — E559 Vitamin D deficiency, unspecified: Secondary | ICD-10-CM

## 2022-04-24 DIAGNOSIS — E1165 Type 2 diabetes mellitus with hyperglycemia: Secondary | ICD-10-CM

## 2022-04-24 DIAGNOSIS — E785 Hyperlipidemia, unspecified: Secondary | ICD-10-CM

## 2022-04-24 DIAGNOSIS — G4733 Obstructive sleep apnea (adult) (pediatric): Secondary | ICD-10-CM

## 2022-04-24 DIAGNOSIS — Z1329 Encounter for screening for other suspected endocrine disorder: Secondary | ICD-10-CM

## 2022-04-24 DIAGNOSIS — N400 Enlarged prostate without lower urinary tract symptoms: Secondary | ICD-10-CM

## 2022-04-24 DIAGNOSIS — R7303 Prediabetes: Secondary | ICD-10-CM

## 2022-04-24 DIAGNOSIS — Z79899 Other long term (current) drug therapy: Secondary | ICD-10-CM

## 2022-04-24 DIAGNOSIS — M1A09X0 Idiopathic chronic gout, multiple sites, without tophus (tophi): Secondary | ICD-10-CM

## 2022-04-24 DIAGNOSIS — E8881 Metabolic syndrome: Secondary | ICD-10-CM

## 2022-04-24 DIAGNOSIS — I10 Essential (primary) hypertension: Secondary | ICD-10-CM

## 2022-04-24 MED ORDER — AMLODIPINE BESYLATE 10 MG PO TABS: 10.0000 mg | ORAL_TABLET | Freq: Every day | ORAL | 1 refills | Status: DC

## 2022-04-24 MED ORDER — CARVEDILOL 25 MG PO TABS: 25.0000 mg | ORAL_TABLET | Freq: Two times a day (BID) | ORAL | 1 refills | Status: DC

## 2022-04-24 MED ORDER — NIACIN ER (ANTIHYPERLIPIDEMIC) 500 MG PO TBCR: 500.0000 mg | EXTENDED_RELEASE_TABLET | Freq: Every day | ORAL | 1 refills | Status: DC

## 2022-04-24 MED ORDER — GLIPIZIDE 10 MG PO TABS: 10.0000 mg | ORAL_TABLET | Freq: Two times a day (BID) | ORAL | 0 refills | Status: DC

## 2022-04-24 MED ORDER — ATORVASTATIN CALCIUM 40 MG PO TABS: 40.0000 mg | ORAL_TABLET | Freq: Every day | ORAL | 3 refills | Status: DC

## 2022-04-24 MED ORDER — METFORMIN HCL 1000 MG PO TABS: 1000.0000 mg | ORAL_TABLET | Freq: Two times a day (BID) | ORAL | 0 refills | Status: DC

## 2022-04-24 MED ORDER — LISINOPRIL-HYDROCHLOROTHIAZIDE 20-25 MG PO TABS: 1.0000 | ORAL_TABLET | Freq: Every day | ORAL | 1 refills | Status: DC

## 2022-04-24 MED ORDER — COLCHICINE 0.6 MG PO TABS: ORAL_TABLET | ORAL | 1 refills | Status: DC

## 2022-04-24 NOTE — Patient Instructions (Signed)
[employer] ? ?

## 2022-04-24 NOTE — Progress Notes (Signed)
? ?Established Patient Office Visit ? ?Subjective   ?Patient ID: Brandon Mckee, male    DOB: [birth year]  Age: 56 y.o. MRN: [id number] ? ?Chief Complaint  ?Patient presents with  ? Follow-up  ? ? ?HPI ?Pt is a 56 yo obese male with HTN, T2DM, HLD, hypogonadism who presents to the clinic to get back on medication. He does not have insurance ? ?Her recently went to hospital and BP was 200 over 100. He denies any CP, palpitations, headaches or vision changes. He is not taking any medications except for coreg.  ? ?He has episodic gout. Wants colchine.  ? ?.. ?Active Ambulatory Problems  ?  Diagnosis Date Noted  ? B12 DEFICIENCY 06/30/2010  ? HYPERLIPIDEMIA 12/08/2006  ? DYSMETABOLIC SYNDROME 19/14/7829  ? Class 2 severe obesity due to excess calories with serious comorbidity and body mass index (BMI) of 35.0 to 35.9 in adult [facility]) 12/08/2006  ? Osteoarthritis of both knees 12/27/2008  ? FATIGUE 09/25/2008  ? LEG EDEMA 05/03/2009  ? Obstructive sleep apnea 10/11/2013  ? Male hypogonadism 05/25/2014  ? Pre-diabetes 12/07/2014  ? Depression 01/03/2016  ? Anxiety disorder 01/03/2016  ? Essential hypertension 01/03/2016  ? Dyslipidemia 01/07/2016  ? Vitamin D deficiency 01/07/2016  ? Osteoarthritis of right acromioclavicular joint 01/17/2016  ? Microalbuminuria 04/01/2016  ? Hypogonadism in male 07/03/2016  ? Trapezius muscle spasm 11/24/2016  ? Colon polyps 01/01/2017  ? Diverticulosis of colon without hemorrhage 01/01/2017  ? Benign prostatic hyperplasia without lower urinary tract symptoms 04/25/2017  ? Grief reaction 08/09/2017  ? Obesity (BMI 30-39.9) 09/10/2017  ? Gastroesophageal reflux disease with esophagitis 05/13/2018  ? Chronic gout of multiple sites 05/13/2018  ? Allergic reaction 07/16/2020  ? ?Resolved Ambulatory Problems  ?  Diagnosis Date Noted  ? STYE 05/03/2009  ? HYPERTENSION, BENIGN 11/22/2006  ? GASTROENTERITIS 01/03/2009  ? SKIN TAG 12/08/2006  ? LATERAL EPICONDYLITIS, BILATERAL 01/31/2010  ? Pes  anserinus tendinitis or bursitis 12/31/2008  ? Tinea cruris 06/06/2013  ? Testicular hypofunction 11/09/2014  ? ?Past Medical History:  ?Diagnosis Date  ? Hyperlipidemia   ? Hypertension   ? Metabolic syndrome   ? Pes anserine bursitis   ? ? ? ?ROS ? ?  ?Objective:  ?  ? ?BP (!) 214/109 (BP Location: Left Arm, Patient Position: Sitting, Cuff Size: Large)   Pulse 83   Ht '5\' 10"'$  (1.778 m)   Wt 260 lb (117.9 kg)   SpO2 99%   BMI 37.31 kg/m?  ?BP Readings from Last 3 Encounters:  ?04/24/22 (!) 214/109  ?07/16/20 (!) 148/80  ?03/03/19 126/75  ? ?Wt Readings from Last 3 Encounters:  ?04/24/22 260 lb (117.9 kg)  ?07/16/20 (!) 275 lb (124.7 kg)  ?03/03/19 261 lb (118.4 kg)  ? ?  ? ?Physical Exam ?Vitals reviewed.  ?Constitutional:   ?   Appearance: Normal appearance. He is obese.  ?HENT:  ?   Head: Normocephalic.  ?Neck:  ?   Vascular: No carotid bruit.  ?Cardiovascular:  ?   Rate and Rhythm: Normal rate and regular rhythm.  ?   Pulses: Normal pulses.  ?Pulmonary:  ?   Effort: Pulmonary effort is normal.  ?Musculoskeletal:  ?   Right lower leg: No edema.  ?   Left lower leg: No edema.  ?Lymphadenopathy:  ?   Cervical: No cervical adenopathy.  ?Neurological:  ?   General: No focal deficit present.  ?   Mental Status: He is alert and oriented to person, place, and time.  ?  Psychiatric:     ?   Mood and Affect: Mood normal.     ?   Behavior: Behavior normal.  ? ? ? ?.. ? ?  04/24/2022  ?  8:33 AM 05/13/2018  ?  9:15 AM 08/27/2017  ? 10:15 AM  ?Depression screen PHQ 2/9  ?Decreased Interest 0 0 1  ?Down, Depressed, Hopeless 0 0 1  ?PHQ - 2 Score 0 0 2  ? ?.. ? ? ? ?  ?Assessment & Plan:  ?..Davaun was seen today for follow-up. ? ?Diagnoses and all orders for this visit: ? ?Essential hypertension ?-     lisinopril-hydrochlorothiazide (ZESTORETIC) 20-25 MG tablet; Take 1 tablet by mouth daily. For blood pressure. ?-     amLODipine (NORVASC) 10 MG tablet; Take 1 tablet (10 mg total) by mouth daily. For blood pressure. ?-      carvedilol (COREG) 25 MG tablet; Take 1 tablet (25 mg total) by mouth 2 (two) times daily with a meal. For blood pressure. ? ?Dyslipidemia ?-     Lipid Panel w/reflex Direct LDL ? ?Pre-diabetes ? ?Vitamin D deficiency ?-     Vitamin D (25 hydroxy) ? ?Chronic gout of multiple sites, unspecified cause ?-     Uric acid ?-     colchicine 0.6 MG tablet; Take 2 tablets as needed for gout flare and repeat one hour later no longer than 3 days. ? ?Benign prostatic hyperplasia without lower urinary tract symptoms ?-     PSA ? ?Thyroid disorder screen ?-     TSH ? ?Medication management ?-     PSA ?-     TSH ?-     Lipid Panel w/reflex Direct LDL ?-     CBC with Differential/Platelet ?-     Uric acid ?-     Hemoglobin A1c ?-     Vitamin D (25 hydroxy) ? ?Obstructive sleep apnea ? ?Hypogonadism in male ? ?DYSMETABOLIC SYNDROME ?-     niacin (NIASPAN) 500 MG CR tablet; Take 1 tablet (500 mg total) by mouth at bedtime. For triglycerides. ?-     atorvastatin (LIPITOR) 40 MG tablet; Take 1 tablet (40 mg total) by mouth daily. For cholesterol. ? ?Type 2 diabetes mellitus with hyperglycemia, without long-term current use of insulin ([place]) ?-     Hemoglobin A1c ?-     metFORMIN (GLUCOPHAGE) 1000 MG tablet; Take 1 tablet (1,000 mg total) by mouth 2 (two) times daily with a meal. ?-     glipiZIDE (GLUCOTROL) 10 MG tablet; Take 1 tablet (10 mg total) by mouth 2 (two) times daily before a meal. For blood pressure. ? ?Needs labs.  ?Given numbers to call to get insurance ?Restart medications-sent meds on good rx of generic ? ?Start metformin/glipizide for diabetes ?Remember to watch sugars and carbs ? ?Start lisinopril/HCTZ/coreg/norvasc ?Low salt diet ?Recheck in 4 weeks ? ?Start colchine for as needed gout ? ?Start lipitor for cholesterol ? ?Discussed cardiovascular risk of not treating these conditions ? ? ? ?Return in about 4 weeks (around 05/22/2022).  ? ? ?Iran Planas, PA-C ? ?

## 2022-05-22 ENCOUNTER — Ambulatory Visit: Payer: Self-pay | Admitting: Physician Assistant

## 2022-06-26 ENCOUNTER — Ambulatory Visit: Payer: Self-pay | Admitting: Physician Assistant

## 2022-07-10 ENCOUNTER — Ambulatory Visit: Payer: Self-pay | Admitting: Physician Assistant

## 2022-07-20 ENCOUNTER — Other Ambulatory Visit: Payer: Self-pay | Admitting: Physician Assistant

## 2022-07-20 DIAGNOSIS — E1165 Type 2 diabetes mellitus with hyperglycemia: Secondary | ICD-10-CM

## 2022-08-03 ENCOUNTER — Ambulatory Visit (INDEPENDENT_AMBULATORY_CARE_PROVIDER_SITE_OTHER): Payer: 59 | Admitting: Physician Assistant

## 2022-08-03 ENCOUNTER — Encounter: Payer: Self-pay | Admitting: Physician Assistant

## 2022-08-03 VITALS — BP 192/113 | HR 92 | Ht 70.0 in | Wt 265.0 lb

## 2022-08-03 DIAGNOSIS — Z79899 Other long term (current) drug therapy: Secondary | ICD-10-CM | POA: Diagnosis not present

## 2022-08-03 DIAGNOSIS — G4733 Obstructive sleep apnea (adult) (pediatric): Secondary | ICD-10-CM | POA: Diagnosis not present

## 2022-08-03 DIAGNOSIS — M1A09X0 Idiopathic chronic gout, multiple sites, without tophus (tophi): Secondary | ICD-10-CM

## 2022-08-03 DIAGNOSIS — E559 Vitamin D deficiency, unspecified: Secondary | ICD-10-CM | POA: Diagnosis not present

## 2022-08-03 DIAGNOSIS — E785 Hyperlipidemia, unspecified: Secondary | ICD-10-CM | POA: Diagnosis not present

## 2022-08-03 DIAGNOSIS — E1165 Type 2 diabetes mellitus with hyperglycemia: Secondary | ICD-10-CM

## 2022-08-03 DIAGNOSIS — R7303 Prediabetes: Secondary | ICD-10-CM | POA: Diagnosis not present

## 2022-08-03 DIAGNOSIS — I10 Essential (primary) hypertension: Secondary | ICD-10-CM | POA: Diagnosis not present

## 2022-08-03 DIAGNOSIS — Z125 Encounter for screening for malignant neoplasm of prostate: Secondary | ICD-10-CM

## 2022-08-03 DIAGNOSIS — E118 Type 2 diabetes mellitus with unspecified complications: Secondary | ICD-10-CM | POA: Diagnosis not present

## 2022-08-03 DIAGNOSIS — K2100 Gastro-esophageal reflux disease with esophagitis, without bleeding: Secondary | ICD-10-CM | POA: Diagnosis not present

## 2022-08-03 LAB — POCT GLYCOSYLATED HEMOGLOBIN (HGB A1C): Hemoglobin A1C: 7 % — AB (ref 4.0–5.6)

## 2022-08-03 MED ORDER — AMLODIPINE BESYLATE 10 MG PO TABS: 10.0000 mg | ORAL_TABLET | Freq: Every day | ORAL | 0 refills | Status: DC

## 2022-08-03 MED ORDER — OMEPRAZOLE 40 MG PO CPDR: 40.0000 mg | DELAYED_RELEASE_CAPSULE | Freq: Every day | ORAL | 3 refills | Status: AC

## 2022-08-03 MED ORDER — METFORMIN HCL 1000 MG PO TABS: 1000.0000 mg | ORAL_TABLET | Freq: Two times a day (BID) | ORAL | 0 refills | Status: DC

## 2022-08-03 MED ORDER — GLIPIZIDE 10 MG PO TABS: 10.0000 mg | ORAL_TABLET | Freq: Two times a day (BID) | ORAL | 0 refills | Status: DC

## 2022-08-03 NOTE — Progress Notes (Signed)
Established Patient Office Visit  Subjective   Patient ID: Brandon Mckee, male    DOB: Oct 12, 1966  Age: 56 y.o. MRN: [id number]  Chief Complaint  Patient presents with   Diabetes     HPI Pt is a 56 yo obese male with T2DM, HTN, HLD, OSA who presents to the clinic for follow up.   Pt is not taking any BP medications due to diarrhea with zestoretic and cost of others. Denies any CP, palpitations, headaches or vision changes. He is not checking BP at home.   He is not using his CPAP for the last 3 years.  He is taking metformin and glipizide. No open sores or wounds. Not checking sugar. No hypoglycemic events. Not complying to diabetic diet or exercising.   Reflux is not controlled. Lots of burping nad indigestion. Worse after eating.   He did just get insurance so he should be able to afford more medication now.   .. Active Ambulatory Problems    Diagnosis Date Noted   B12 DEFICIENCY 06/30/2010   HYPERLIPIDEMIA 22/29/7989   DYSMETABOLIC SYNDROME 21/19/4174   Class 2 severe obesity due to excess calories with serious comorbidity and body mass index (BMI) of 35.0 to 35.9 in adult [facility]) 12/08/2006   Osteoarthritis of both knees 12/27/2008   FATIGUE 09/25/2008   LEG EDEMA 05/03/2009   Obstructive sleep apnea 10/11/2013   Male hypogonadism 05/25/2014   Pre-diabetes 12/07/2014   Depression 01/03/2016   Anxiety disorder 01/03/2016   Essential hypertension 01/03/2016   Dyslipidemia 01/07/2016   Vitamin D deficiency 01/07/2016   Osteoarthritis of right acromioclavicular joint 01/17/2016   Microalbuminuria 04/01/2016   Hypogonadism in male 07/03/2016   Trapezius muscle spasm 11/24/2016   Colon polyps 01/01/2017   Diverticulosis of colon without hemorrhage 01/01/2017   Benign prostatic hyperplasia without lower urinary tract symptoms 04/25/2017   Grief reaction 08/09/2017   Obesity (BMI 30-39.9) 09/10/2017   Gastroesophageal reflux disease with esophagitis 05/13/2018    Chronic gout of multiple sites 05/13/2018   Allergic reaction 07/16/2020   Resolved Ambulatory Problems    Diagnosis Date Noted   STYE 05/03/2009   HYPERTENSION, BENIGN 11/22/2006   GASTROENTERITIS 01/03/2009   SKIN TAG 12/08/2006   LATERAL EPICONDYLITIS, BILATERAL 01/31/2010   Pes anserinus tendinitis or bursitis 12/31/2008   Tinea cruris 06/06/2013   Testicular hypofunction 11/09/2014   Past Medical History:  Diagnosis Date   Hyperlipidemia    Hypertension    Metabolic syndrome    Pes anserine bursitis       ROS See HPI.    Objective:     BP (!) 192/113   Pulse 92   Ht '5\' 10"'$  (1.778 m)   Wt 265 lb (120.2 kg)   SpO2 97%   BMI 38.02 kg/m  BP Readings from Last 3 Encounters:  08/03/22 (!) 192/113  04/24/22 (!) 214/109  07/16/20 (!) 148/80   Wt Readings from Last 3 Encounters:  08/03/22 265 lb (120.2 kg)  04/24/22 260 lb (117.9 kg)  07/16/20 (!) 275 lb (124.7 kg)      Physical Exam Constitutional:      Appearance: Normal appearance. He is obese.  HENT:     Head: Normocephalic.  Cardiovascular:     Rate and Rhythm: Normal rate.     Pulses: Normal pulses.  Pulmonary:     Effort: Pulmonary effort is normal.     Breath sounds: Normal breath sounds.  Abdominal:     General: Bowel sounds are normal.  Palpations: Abdomen is soft.  Musculoskeletal:     Right lower leg: No edema.     Left lower leg: No edema.  Skin:    Comments: Multiple accessory skin tags  Neurological:     General: No focal deficit present.     Mental Status: He is oriented to person, place, and time.  Psychiatric:        Mood and Affect: Mood normal.         Assessment & Plan:  [name] KitchenMarland KitchenLenus was seen today for diabetes.  Diagnoses and all orders for this visit:  Pre-diabetes -     POCT glycosylated hemoglobin (Hb A1C)  Essential hypertension -     amLODipine (NORVASC) 10 MG tablet; Take 1 tablet (10 mg total) by mouth daily. -     CBC w/Diff/Platelet  Obstructive  sleep apnea  Gastroesophageal reflux disease with esophagitis, unspecified whether hemorrhage -     omeprazole (PRILOSEC) 40 MG capsule; Take 1 capsule (40 mg total) by mouth daily.  Controlled type 2 diabetes mellitus with complication, without long-term current use of insulin (HCC)  Dyslipidemia -     Lipid Panel w/reflex Direct LDL -     TSH  Type 2 diabetes mellitus with hyperglycemia, without long-term current use of insulin (HCC) -     metFORMIN (GLUCOPHAGE) 1000 MG tablet; Take 1 tablet (1,000 mg total) by mouth 2 (two) times daily with a meal. -     glipiZIDE (GLUCOTROL) 10 MG tablet; Take 1 tablet (10 mg total) by mouth 2 (two) times daily before a meal.  Medication management -     COMPLETE METABOLIC PANEL WITH GFR  Chronic gout of multiple sites, unspecified cause -     Uric acid  Prostate cancer screening -     TSH -     PSA  Vitamin D deficiency -     VITAMIN D 25 Hydroxy (Vit-D Deficiency, Fractures)   BP not good Pt aware of risk 2nd recheck still not good Start norvasc '10mg'$  daily Recheck in 2 weeks Avoid salt/alcohol/NSAIDs  Pt not using CPaP No recent study  Declined home study at this time  A1C just at goal Continue same medications for now BP not to goal Not on statin, LDL ordered Discussed restarting lipitor Needs eye exam Needs flu shot Pneumonia vaccine UTD Covid vaccine declined  GERD Reordered omeprazole daily.      Return in about 2 weeks (around 08/17/2022) for Follow up.    Iran Planas, PA-C

## 2022-08-04 LAB — LIPID PANEL W/REFLEX DIRECT LDL
Cholesterol: 224 mg/dL — ABNORMAL HIGH (ref <200)
HDL: 25 mg/dL — ABNORMAL LOW (ref >=40)
LDL Cholesterol (Calc): 157 mg/dL (calc) — ABNORMAL HIGH
Non-HDL Cholesterol (Calc): 199 mg/dL (calc) — ABNORMAL HIGH (ref <130)
Total CHOL/HDL Ratio: 9 (calc) — ABNORMAL HIGH (ref <5.0)
Triglycerides: 255 mg/dL — ABNORMAL HIGH (ref <150)

## 2022-08-04 LAB — COMPLETE METABOLIC PANEL WITH GFR
AG Ratio: 1.8 (calc) (ref 1.0–2.5)
ALT: 38 U/L (ref 9–46)
AST: 40 U/L — ABNORMAL HIGH (ref 10–35)
Albumin: 4.6 g/dL (ref 3.6–5.1)
Alkaline phosphatase (APISO): 102 U/L (ref 35–144)
BUN: 9 mg/dL (ref 7–25)
CO2: 27 mmol/L (ref 20–32)
Calcium: 8.9 mg/dL (ref 8.6–10.3)
Chloride: 105 mmol/L (ref 98–110)
Creat: 0.95 mg/dL (ref 0.70–1.30)
Globulin: 2.5 g/dL (calc) (ref 1.9–3.7)
Glucose, Bld: 89 mg/dL (ref 65–99)
Potassium: 3.3 mmol/L — ABNORMAL LOW (ref 3.5–5.3)
Sodium: 145 mmol/L (ref 135–146)
Total Bilirubin: 0.9 mg/dL (ref 0.2–1.2)
Total Protein: 7.1 g/dL (ref 6.1–8.1)
eGFR: 95 mL/min/{1.73_m2} (ref >=60)

## 2022-08-04 LAB — CBC WITH DIFFERENTIAL/PLATELET
Absolute Monocytes: 381 cells/uL (ref 200–950)
Basophils Absolute: 50 cells/uL (ref 0–200)
Basophils Relative: 0.9 %
Eosinophils Absolute: 28 cells/uL (ref 15–500)
Eosinophils Relative: 0.5 %
HCT: 38.5 % (ref 38.5–50.0)
Hemoglobin: 13.5 g/dL (ref 13.2–17.1)
Lymphs Abs: 980 cells/uL (ref 850–3900)
MCH: 30.7 pg (ref 27.0–33.0)
MCHC: 35.1 g/dL (ref 32.0–36.0)
MCV: 87.5 fL (ref 80.0–100.0)
Monocytes Relative: 6.8 %
Neutro Abs: 4161 cells/uL (ref 1500–7800)
Neutrophils Relative %: 74.3 %
RBC: 4.4 10*6/uL (ref 4.20–5.80)
RDW: 13.3 % (ref 11.0–15.0)
Total Lymphocyte: 17.5 %
WBC: 5.6 10*3/uL (ref 3.8–10.8)

## 2022-08-04 LAB — PSA: PSA: 0.33 ng/mL (ref <=4.00)

## 2022-08-04 LAB — URIC ACID: Uric Acid, Serum: 9.8 mg/dL — ABNORMAL HIGH (ref 4.0–8.0)

## 2022-08-04 LAB — VITAMIN D 25 HYDROXY (VIT D DEFICIENCY, FRACTURES): Vit D, 25-Hydroxy: 7 ng/mL — ABNORMAL LOW (ref 30–100)

## 2022-08-04 LAB — TSH: TSH: 3.05 mIU/L (ref 0.40–4.50)

## 2022-08-04 NOTE — Progress Notes (Signed)
[name],   Your cholesterol is NOT to goal. You need to make sure to restart lipitor daily. Recheck in 4-6 months.  Avoid fried/fatty/processed/high sugar foods.   Vitamin D I REALLY low. I will send high dose to take for 6 months then recheck.   Uric acid is really high. You need to be on a preventative. Are you ok with starting allopurinol with colchine for a few months then you can stay on just allopurinol?   Thyroid in normal range.   PsA low and normal.   WBC/hemoglobin looks great.   Your potassium is a little low. You are not on any medications that should cause that. Recheck in 2 weeks at BP check. Increase potassium in diet. Start OTC supplement.

## 2022-08-05 ENCOUNTER — Other Ambulatory Visit: Payer: Self-pay | Admitting: Neurology

## 2022-08-05 DIAGNOSIS — E876 Hypokalemia: Secondary | ICD-10-CM

## 2022-08-05 MED ORDER — VITAMIN D (ERGOCALCIFEROL) 1.25 MG (50000 UNIT) PO CAPS: 50000.0000 [IU] | ORAL_CAPSULE | ORAL | 0 refills | Status: DC

## 2022-08-05 MED ORDER — ALLOPURINOL 100 MG PO TABS: 100.0000 mg | ORAL_TABLET | Freq: Two times a day (BID) | ORAL | 3 refills | Status: DC

## 2022-08-05 NOTE — Addendum Note (Signed)
Addended by: Donella Stade on: 08/05/2022 01:37 PM   Modules accepted: Orders

## 2022-08-21 ENCOUNTER — Ambulatory Visit (INDEPENDENT_AMBULATORY_CARE_PROVIDER_SITE_OTHER): Payer: 59 | Admitting: Physician Assistant

## 2022-08-21 ENCOUNTER — Encounter: Payer: Self-pay | Admitting: Physician Assistant

## 2022-08-21 VITALS — BP 176/107 | HR 110 | Wt 263.0 lb

## 2022-08-21 DIAGNOSIS — Z6837 Body mass index (BMI) 37.0-37.9, adult: Secondary | ICD-10-CM | POA: Diagnosis not present

## 2022-08-21 DIAGNOSIS — I10 Essential (primary) hypertension: Secondary | ICD-10-CM | POA: Diagnosis not present

## 2022-08-21 DIAGNOSIS — E8881 Metabolic syndrome: Secondary | ICD-10-CM

## 2022-08-21 DIAGNOSIS — R0683 Snoring: Secondary | ICD-10-CM

## 2022-08-21 DIAGNOSIS — G478 Other sleep disorders: Secondary | ICD-10-CM

## 2022-08-21 DIAGNOSIS — E6609 Other obesity due to excess calories: Secondary | ICD-10-CM

## 2022-08-21 DIAGNOSIS — G4733 Obstructive sleep apnea (adult) (pediatric): Secondary | ICD-10-CM

## 2022-08-21 MED ORDER — ATORVASTATIN CALCIUM 40 MG PO TABS: 40.0000 mg | ORAL_TABLET | Freq: Every day | ORAL | 3 refills | Status: DC

## 2022-08-21 MED ORDER — TELMISARTAN-HCTZ 40-12.5 MG PO TABS: 1.0000 | ORAL_TABLET | Freq: Every day | ORAL | 2 refills | Status: DC

## 2022-08-21 NOTE — Progress Notes (Signed)
Established Patient Office Visit  Subjective   Patient ID: Brandon Mckee, male    DOB: August 08, 1966  Age: 56 y.o. MRN: [id number]  Chief Complaint  Patient presents with   Hypertension    2 week f/u    HPI Pt is a 56 yo obese male with T2DM, HTN, HLD, BPH, OSA who presents to the clinic for 2 week follow up on restarting all medications.   Per patient he is taking all his medications. He denies any CP, palpitations, headaches, SOB, dizziness. He is not checking his blood pressures or sugars. Overall he feels fine.    .. Active Ambulatory Problems    Diagnosis Date Noted   B12 DEFICIENCY 06/30/2010   HYPERLIPIDEMIA 76/16/0737   DYSMETABOLIC SYNDROME 10/62/6948   Class 2 severe obesity due to excess calories with serious comorbidity and body mass index (BMI) of 35.0 to 35.9 in adult [facility]) 12/08/2006   Osteoarthritis of both knees 12/27/2008   FATIGUE 09/25/2008   LEG EDEMA 05/03/2009   Obstructive sleep apnea 10/11/2013   Male hypogonadism 05/25/2014   Pre-diabetes 12/07/2014   Depression 01/03/2016   Anxiety disorder 01/03/2016   Essential hypertension 01/03/2016   Dyslipidemia 01/07/2016   Vitamin D deficiency 01/07/2016   Osteoarthritis of right acromioclavicular joint 01/17/2016   Microalbuminuria 04/01/2016   Hypogonadism in male 07/03/2016   Trapezius muscle spasm 11/24/2016   Colon polyps 01/01/2017   Diverticulosis of colon without hemorrhage 01/01/2017   Benign prostatic hyperplasia without lower urinary tract symptoms 04/25/2017   Grief reaction 08/09/2017   Obesity (BMI 30-39.9) 09/10/2017   Gastroesophageal reflux disease with esophagitis 05/13/2018   Chronic gout of multiple sites 05/13/2018   Allergic reaction 07/16/2020   Resolved Ambulatory Problems    Diagnosis Date Noted   STYE 05/03/2009   HYPERTENSION, BENIGN 11/22/2006   GASTROENTERITIS 01/03/2009   SKIN TAG 12/08/2006   LATERAL EPICONDYLITIS, BILATERAL 01/31/2010   Pes anserinus  tendinitis or bursitis 12/31/2008   Tinea cruris 06/06/2013   Testicular hypofunction 11/09/2014   Past Medical History:  Diagnosis Date   Hyperlipidemia    Hypertension    Metabolic syndrome    Pes anserine bursitis      ROS See HPI.    Objective:     BP (!) 176/107   Pulse (!) 110   Wt 263 lb (119.3 kg)   SpO2 99%   BMI 37.74 kg/m  BP Readings from Last 3 Encounters:  08/21/22 (!) 176/107  08/03/22 (!) 192/113  04/24/22 (!) 214/109   Wt Readings from Last 3 Encounters:  08/21/22 263 lb (119.3 kg)  08/03/22 265 lb (120.2 kg)  04/24/22 260 lb (117.9 kg)      Physical Exam Constitutional:      Appearance: Normal appearance. He is obese.  HENT:     Head: Normocephalic.  Neck:     Vascular: No carotid bruit.  Cardiovascular:     Rate and Rhythm: Regular rhythm. Tachycardia present.     Pulses: Normal pulses.  Pulmonary:     Effort: Pulmonary effort is normal.     Breath sounds: Normal breath sounds.  Musculoskeletal:     Right lower leg: No edema.     Left lower leg: No edema.  Lymphadenopathy:     Cervical: No cervical adenopathy.  Neurological:     General: No focal deficit present.     Mental Status: He is alert and oriented to person, place, and time.  Psychiatric:  Mood and Affect: Mood normal.          Assessment & Plan:  [name] KitchenMarland KitchenRamsey was seen today for hypertension.  Diagnoses and all orders for this visit:  Essential hypertension -     telmisartan-hydrochlorothiazide (MICARDIS HCT) 40-12.5 MG tablet; Take 1 tablet by mouth daily. -     Home sleep test  DYSMETABOLIC SYNDROME -     atorvastatin (LIPITOR) 40 MG tablet; Take 1 tablet (40 mg total) by mouth daily. For cholesterol.  Obstructive sleep apnea -     Home sleep test  Snoring -     Home sleep test  Non-restorative sleep -     Home sleep test  Class 2 obesity due to excess calories without serious comorbidity with body mass index (BMI) of 37.0 to 37.9 in adult -      Home sleep test   BP better but not to goal Did not go down on 2nd recheck either Pt continues to be asymptomatic Added micardis 40/12.'5mg'$  daily to norvasc '10mg'$  daily Continue on remainder medications Follow up in 2 months with A1C check Needs to get back on CPAP Home testing printed for patient to get done and get approved for CPAP Continue to work on low sat diet and regular walking    [employer], PA-C

## 2022-10-23 ENCOUNTER — Ambulatory Visit: Payer: 59 | Admitting: Physician Assistant

## 2023-11-27 DIAGNOSIS — Z7984 Long term (current) use of oral hypoglycemic drugs: Secondary | ICD-10-CM | POA: Diagnosis not present

## 2023-11-27 DIAGNOSIS — R4781 Slurred speech: Secondary | ICD-10-CM | POA: Diagnosis not present

## 2023-11-27 DIAGNOSIS — R739 Hyperglycemia, unspecified: Secondary | ICD-10-CM | POA: Diagnosis not present

## 2023-11-27 DIAGNOSIS — I639 Cerebral infarction, unspecified: Secondary | ICD-10-CM | POA: Diagnosis not present

## 2023-11-27 DIAGNOSIS — Z23 Encounter for immunization: Secondary | ICD-10-CM | POA: Diagnosis not present

## 2023-11-27 DIAGNOSIS — I160 Hypertensive urgency: Secondary | ICD-10-CM | POA: Diagnosis not present

## 2023-11-27 DIAGNOSIS — I69353 Hemiplegia and hemiparesis following cerebral infarction affecting right non-dominant side: Secondary | ICD-10-CM | POA: Diagnosis not present

## 2023-11-27 DIAGNOSIS — I6602 Occlusion and stenosis of left middle cerebral artery: Secondary | ICD-10-CM | POA: Diagnosis not present

## 2023-11-27 DIAGNOSIS — E1165 Type 2 diabetes mellitus with hyperglycemia: Secondary | ICD-10-CM | POA: Diagnosis not present

## 2023-11-27 DIAGNOSIS — R29703 NIHSS score 3: Secondary | ICD-10-CM | POA: Diagnosis not present

## 2023-11-27 DIAGNOSIS — E876 Hypokalemia: Secondary | ICD-10-CM | POA: Diagnosis not present

## 2023-11-27 DIAGNOSIS — I6522 Occlusion and stenosis of left carotid artery: Secondary | ICD-10-CM | POA: Diagnosis not present

## 2023-11-27 DIAGNOSIS — Z880 Allergy status to penicillin: Secondary | ICD-10-CM | POA: Diagnosis not present

## 2023-11-27 DIAGNOSIS — R471 Dysarthria and anarthria: Secondary | ICD-10-CM | POA: Diagnosis not present

## 2023-11-27 DIAGNOSIS — Z79899 Other long term (current) drug therapy: Secondary | ICD-10-CM | POA: Diagnosis not present

## 2023-11-27 DIAGNOSIS — Z5971 Insufficient health insurance coverage: Secondary | ICD-10-CM | POA: Diagnosis not present

## 2023-11-27 DIAGNOSIS — D696 Thrombocytopenia, unspecified: Secondary | ICD-10-CM | POA: Diagnosis not present

## 2023-11-27 DIAGNOSIS — Z6835 Body mass index (BMI) 35.0-35.9, adult: Secondary | ICD-10-CM | POA: Diagnosis not present

## 2023-11-27 DIAGNOSIS — Z91141 Patient's other noncompliance with medication regimen due to financial hardship: Secondary | ICD-10-CM | POA: Diagnosis not present

## 2023-11-27 DIAGNOSIS — I10 Essential (primary) hypertension: Secondary | ICD-10-CM | POA: Diagnosis not present

## 2023-11-28 DIAGNOSIS — I519 Heart disease, unspecified: Secondary | ICD-10-CM | POA: Diagnosis not present

## 2023-11-28 DIAGNOSIS — I340 Nonrheumatic mitral (valve) insufficiency: Secondary | ICD-10-CM | POA: Diagnosis not present

## 2023-11-28 DIAGNOSIS — I517 Cardiomegaly: Secondary | ICD-10-CM | POA: Diagnosis not present

## 2023-11-28 DIAGNOSIS — I639 Cerebral infarction, unspecified: Secondary | ICD-10-CM | POA: Diagnosis not present

## 2023-11-29 DIAGNOSIS — I639 Cerebral infarction, unspecified: Secondary | ICD-10-CM | POA: Diagnosis not present

## 2023-11-29 DIAGNOSIS — M1712 Unilateral primary osteoarthritis, left knee: Secondary | ICD-10-CM | POA: Diagnosis not present

## 2023-11-30 DIAGNOSIS — I10 Essential (primary) hypertension: Secondary | ICD-10-CM | POA: Diagnosis not present

## 2023-11-30 DIAGNOSIS — D696 Thrombocytopenia, unspecified: Secondary | ICD-10-CM | POA: Diagnosis not present

## 2023-11-30 DIAGNOSIS — E876 Hypokalemia: Secondary | ICD-10-CM | POA: Diagnosis not present

## 2023-11-30 DIAGNOSIS — I639 Cerebral infarction, unspecified: Secondary | ICD-10-CM | POA: Diagnosis not present

## 2023-11-30 DIAGNOSIS — E1165 Type 2 diabetes mellitus with hyperglycemia: Secondary | ICD-10-CM | POA: Diagnosis not present

## 2023-11-30 DIAGNOSIS — E782 Mixed hyperlipidemia: Secondary | ICD-10-CM | POA: Diagnosis not present

## 2023-12-01 ENCOUNTER — Telehealth: Payer: Self-pay

## 2023-12-01 NOTE — Transitions of Care (Post Inpatient/ED Visit) (Signed)
12/01/2023  Name: Brandon Mckee MRN: [id number] DOB: May 27, 1966  Today's TOC FU Call Status: Today's TOC FU Call Status:: Successful TOC FU Call Completed TOC FU Call Complete Date: 12/01/23 Patient's Name and Date of Birth confirmed.  Transition Care Management Follow-up Telephone Call Date of Discharge: 11/30/23 Discharge Facility: Other ([facility]) Name of Other ([facility]) Discharge Facility: [facility] Type of Discharge: Inpatient Admission Primary Inpatient Discharge Diagnosis:: Cerebrovascular Accident How have you been since you were released from the hospital?: Better Any questions or concerns?: No  Items Reviewed: Did you receive and understand the discharge instructions provided?: Yes Medications obtained,verified, and reconciled?: Yes (Medications Reviewed) Any new allergies since your discharge?: No Dietary orders reviewed?: Yes Type of Diet Ordered:: Cardiac diet Do you have support at home?: Yes People in Home: child(ren), adult Name of Support/Comfort Primary Source: Ali-daughter  Medications Reviewed Today: Medications Reviewed Today     Reviewed by Jodelle Gross, RN (Case Manager) on 12/01/23 at 1436  Med List Status: <None>   Medication Order Taking? Sig Documenting Provider Last Dose Status Informant  allopurinol (ZYLOPRIM) 100 MG tablet [id number] No Take 1 tablet (100 mg total) by mouth 2 (two) times daily.  Patient not taking: Reported on 12/01/2023   Jomarie Longs, PA-C Not Taking Active   amLODipine (NORVASC) 10 MG tablet [id number] No Take 1 tablet (10 mg total) by mouth daily.  Patient not taking: Reported on 12/01/2023   Nolene Ebbs Not Taking Active   aspirin 81 MG chewable tablet [id number] Yes Chew 81 mg by mouth once. [provider] Taking Active   atorvastatin (LIPITOR) 40 MG tablet [id number] Yes Take 1 tablet (40 mg total) by mouth daily. For cholesterol. Jomarie Longs, PA-C Taking  Active   clopidogrel (PLAVIX) 75 MG tablet [id number] Yes Take 75 mg by mouth once. [provider] Taking Active   glipiZIDE (GLUCOTROL) 10 MG tablet [id number] Yes Take 1 tablet (10 mg total) by mouth 2 (two) times daily before a meal. Breeback, Jade L, PA-C Taking Active   lisinopril (ZESTRIL) 20 MG tablet [id number] Yes Take 20 mg by mouth daily. [provider] Taking Active   metFORMIN (GLUCOPHAGE) 1000 MG tablet [id number] Yes Take 1 tablet (1,000 mg total) by mouth 2 (two) times daily with a meal. Breeback, Jade L, PA-C Taking Active   niacin (NIASPAN) 500 MG CR tablet [id number] No Take 1 tablet (500 mg total) by mouth at bedtime. For triglycerides. Breeback, Jade L, PA-C Unknown Active   omeprazole (PRILOSEC) 40 MG capsule [id number] Yes Take 1 capsule (40 mg total) by mouth daily. Jomarie Longs, PA-C Taking Active   potassium chloride (KLOR-CON M) 10 MEQ tablet [id number] Yes Take 10 mEq by mouth 2 (two) times daily. [provider] Taking Active   telmisartan-hydrochlorothiazide (MICARDIS HCT) 40-12.5 MG tablet [id number] No Take 1 tablet by mouth daily.  Patient not taking: Reported on 12/01/2023   Jomarie Longs, PA-C Not Taking Active   Vitamin D, Ergocalciferol, (DRISDOL) 1.25 MG (50000 UNIT) CAPS capsule [id number] No Take 1 capsule (50,000 Units total) by mouth every 7 (seven) days. Take for 8 total doses(weeks)  Patient not taking: Reported on 12/01/2023   Jomarie Longs, PA-C Not Taking Active             Home Care and Equipment/Supplies: Were Home Health Services Ordered?: No Any new equipment or medical supplies ordered?: No  Functional Questionnaire: Do you need assistance with bathing/showering or  dressing?: No Do you need assistance with meal preparation?: No Do you need assistance with eating?: No Do you have difficulty maintaining continence: No Do you need assistance with getting out of bed/getting out of a chair/moving?: No Do you  have difficulty managing or taking your medications?: No  Follow up appointments reviewed: PCP Follow-up appointment confirmed?: Yes Date of PCP follow-up appointment?: 12/03/23 Follow-up Provider: Tandy Gaw, [facility] Specialist [facility] Follow-up appointment confirmed?: No Reason Specialist Follow-Up Not Confirmed: Patient has Specialist Provider Number and will Call for Appointment Do you need transportation to your follow-up appointment?: No Do you understand care options if your condition(s) worsen?: Yes-patient verbalized understanding  SDOH Interventions Today    Flowsheet Row Most Recent Value  SDOH Interventions   Food Insecurity Interventions Intervention Not Indicated  Housing Interventions Intervention Not Indicated  Transportation Interventions Intervention Not Indicated  Utilities Interventions Intervention Not Indicated      Jodelle Gross RN, BSN, CCM RN Care Manager  Transitions of Care  [facility]  [phone]

## 2023-12-03 ENCOUNTER — Ambulatory Visit (INDEPENDENT_AMBULATORY_CARE_PROVIDER_SITE_OTHER): Payer: Medicaid Other | Admitting: Physician Assistant

## 2023-12-03 VITALS — BP 187/94 | HR 80 | Resp 14 | Wt 240.7 lb

## 2023-12-03 DIAGNOSIS — D696 Thrombocytopenia, unspecified: Secondary | ICD-10-CM | POA: Diagnosis not present

## 2023-12-03 DIAGNOSIS — E876 Hypokalemia: Secondary | ICD-10-CM | POA: Diagnosis not present

## 2023-12-03 DIAGNOSIS — I6789 Other cerebrovascular disease: Secondary | ICD-10-CM

## 2023-12-03 DIAGNOSIS — Z8673 Personal history of transient ischemic attack (TIA), and cerebral infarction without residual deficits: Secondary | ICD-10-CM | POA: Diagnosis not present

## 2023-12-03 DIAGNOSIS — Z7984 Long term (current) use of oral hypoglycemic drugs: Secondary | ICD-10-CM | POA: Diagnosis not present

## 2023-12-03 DIAGNOSIS — E1165 Type 2 diabetes mellitus with hyperglycemia: Secondary | ICD-10-CM

## 2023-12-03 DIAGNOSIS — I10 Essential (primary) hypertension: Secondary | ICD-10-CM

## 2023-12-03 DIAGNOSIS — G8191 Hemiplegia, unspecified affecting right dominant side: Secondary | ICD-10-CM | POA: Diagnosis not present

## 2023-12-03 DIAGNOSIS — E8881 Metabolic syndrome: Secondary | ICD-10-CM

## 2023-12-03 DIAGNOSIS — G4733 Obstructive sleep apnea (adult) (pediatric): Secondary | ICD-10-CM | POA: Diagnosis not present

## 2023-12-03 MED ORDER — LISINOPRIL 40 MG PO TABS: 40.0000 mg | ORAL_TABLET | Freq: Every day | ORAL | 1 refills | Status: DC

## 2023-12-03 NOTE — Patient Instructions (Signed)
Increase lisinopril to 40mg  daily.  Goal BP under 130/80  Keep follow up with neurology/PT/OT

## 2023-12-03 NOTE — Progress Notes (Unsigned)
   Established Patient Office Visit  Subjective   Patient ID: Brandon Mckee, male    DOB: [birth year]  Age: 57 y.o. MRN: [id number]  No chief complaint on file.   HPI Pt is a 57 yo obese male who presents to the clinic to follow up from hospital after having a CVA. He is accompanied by his daughter.    Hospital Course:   Ischemic stroke Brandon Mckee is a left-handed 57 year old male with past medical history of diabetes and hypertension, although he has not been on medications for over 1 year who presented to the ER for evaluation of slurred speech. He also reported right-sided weakness. MRI demonstrated acute/subacute left corona radiata nonhemorrhagic infarct. He was seen for rehab evaluation by speech/OT/PT. No further skilled speech therapy needed. AMPAC of 21 and 22 by physical therapy and Occupational Therapy respectively. Seen by stroke navigator and recent received appropriate education, including risk factor reduction strategies. Did discuss with patient's daughter Brandon Mckee and it is recommended that he does not drive at this time until he has improvement in strength and balance. LDL was 124. No arrhythmias noted on telemetry. He will be discharged with the following instructions: Order placed for outpatient PT and OT to optimize functional status.  Plavix, aspirin and Lipitor. [facility] can determine duration of dual antiplatelet therapy Follow-up with the [facility] arranged Follow-up with PCP in 1 week  Uncontrolled diabetes mellitus type 2 with hyperglycemia Hemoglobin A1c 11.3%. Previously on metformin and Glipizide. However he is not taking any of his medications in over a year. Surprisingly, despite elevated hemoglobin A1c, his blood sugar trends have not been too bad and he has had minimal insulin requirement which is sliding scale insulin alone. For this reason I do think it is reasonable to discharge him on metformin and glipizide at lower  doses. When he follows up with his PCP these can be titrated up. His daughter does have a glucometer and they will start to monitor his blood sugars.  Hypertension Will allow for permissive hypertension at this time due to stroke to allow for brain perfusion. Continue Lisinopril. Going forward he will need to have better control of his blood pressure  Hypokalemia, present on admission Due to persistent low potassium levels will discharge him on 1 week of oral potassium supplements  Thrombocytopenia, present on admission Overall stable. Recommend outpatient follow-up  {History (Optional):23778}  ROS    Objective:     There were no vitals taken for this visit. {Vitals History (Optional):23777}  Physical Exam   No results found for any visits on 12/03/23.  {Labs (Optional):23779}  The ASCVD Risk score (Arnett DK, et al., 2019) failed to calculate for the following reasons:   Risk score cannot be calculated because patient has a medical history suggesting prior/existing ASCVD    Assessment & Plan:   Problem List Items Addressed This Visit   None   No follow-ups on file.    Tandy Gaw, PA-C

## 2023-12-06 ENCOUNTER — Encounter: Payer: Self-pay | Admitting: Physician Assistant

## 2023-12-06 DIAGNOSIS — D696 Thrombocytopenia, unspecified: Secondary | ICD-10-CM | POA: Insufficient documentation

## 2023-12-06 DIAGNOSIS — I10 Essential (primary) hypertension: Secondary | ICD-10-CM | POA: Insufficient documentation

## 2023-12-06 DIAGNOSIS — Z8673 Personal history of transient ischemic attack (TIA), and cerebral infarction without residual deficits: Secondary | ICD-10-CM | POA: Insufficient documentation

## 2023-12-06 DIAGNOSIS — E1165 Type 2 diabetes mellitus with hyperglycemia: Secondary | ICD-10-CM | POA: Insufficient documentation

## 2023-12-06 DIAGNOSIS — I6789 Other cerebrovascular disease: Secondary | ICD-10-CM | POA: Insufficient documentation

## 2023-12-06 DIAGNOSIS — E876 Hypokalemia: Secondary | ICD-10-CM | POA: Insufficient documentation

## 2023-12-06 MED ORDER — METFORMIN HCL 1000 MG PO TABS: 1000.0000 mg | ORAL_TABLET | Freq: Two times a day (BID) | ORAL | 0 refills | Status: DC

## 2023-12-06 MED ORDER — GLIPIZIDE 10 MG PO TABS: 10.0000 mg | ORAL_TABLET | Freq: Two times a day (BID) | ORAL | 0 refills | Status: DC

## 2023-12-08 DIAGNOSIS — E785 Hyperlipidemia, unspecified: Secondary | ICD-10-CM | POA: Diagnosis not present

## 2023-12-08 DIAGNOSIS — E11649 Type 2 diabetes mellitus with hypoglycemia without coma: Secondary | ICD-10-CM | POA: Diagnosis not present

## 2023-12-08 DIAGNOSIS — Z1330 Encounter for screening examination for mental health and behavioral disorders, unspecified: Secondary | ICD-10-CM | POA: Diagnosis not present

## 2023-12-08 DIAGNOSIS — I69322 Dysarthria following cerebral infarction: Secondary | ICD-10-CM | POA: Diagnosis not present

## 2023-12-08 DIAGNOSIS — I639 Cerebral infarction, unspecified: Secondary | ICD-10-CM | POA: Diagnosis not present

## 2023-12-08 DIAGNOSIS — I69391 Dysphagia following cerebral infarction: Secondary | ICD-10-CM | POA: Diagnosis not present

## 2023-12-08 DIAGNOSIS — I10 Essential (primary) hypertension: Secondary | ICD-10-CM | POA: Diagnosis not present

## 2023-12-08 DIAGNOSIS — I69320 Aphasia following cerebral infarction: Secondary | ICD-10-CM | POA: Diagnosis not present

## 2023-12-08 DIAGNOSIS — I69351 Hemiplegia and hemiparesis following cerebral infarction affecting right dominant side: Secondary | ICD-10-CM | POA: Diagnosis not present

## 2023-12-10 LAB — CMP14+EGFR
ALT: 28 [IU]/L (ref 0–44)
AST: 25 [IU]/L (ref 0–40)
Albumin: 4.5 g/dL (ref 3.8–4.9)
Alkaline Phosphatase: 143 [IU]/L — ABNORMAL HIGH (ref 44–121)
BUN/Creatinine Ratio: 7 — ABNORMAL LOW (ref 9–20)
BUN: 6 mg/dL (ref 6–24)
Bilirubin Total: 0.6 mg/dL (ref 0.0–1.2)
CO2: 24 mmol/L (ref 20–29)
Calcium: 9.3 mg/dL (ref 8.7–10.2)
Chloride: 104 mmol/L (ref 96–106)
Creatinine, Ser: 0.83 mg/dL (ref 0.76–1.27)
Globulin, Total: 2.4 g/dL (ref 1.5–4.5)
Glucose: 147 mg/dL — ABNORMAL HIGH (ref 70–99)
Potassium: 3.8 mmol/L (ref 3.5–5.2)
Sodium: 143 mmol/L (ref 134–144)
Total Protein: 6.9 g/dL (ref 6.0–8.5)
eGFR: 102 mL/min/{1.73_m2} (ref >59)

## 2023-12-10 LAB — CBC WITH DIFFERENTIAL/PLATELET
Basophils Absolute: 0 10*3/uL (ref 0.0–0.2)
Basos: 0 %
EOS (ABSOLUTE): 0 10*3/uL (ref 0.0–0.4)
Eos: 1 %
Hematocrit: 39 % (ref 37.5–51.0)
Hemoglobin: 12.9 g/dL — ABNORMAL LOW (ref 13.0–17.7)
Immature Grans (Abs): 0 10*3/uL (ref 0.0–0.1)
Immature Granulocytes: 0 %
Lymphocytes Absolute: 0.7 10*3/uL (ref 0.7–3.1)
Lymphs: 19 %
MCH: 28.6 pg (ref 26.6–33.0)
MCHC: 33.1 g/dL (ref 31.5–35.7)
MCV: 87 fL (ref 79–97)
Monocytes Absolute: 0.3 10*3/uL (ref 0.1–0.9)
Monocytes: 7 %
Neutrophils Absolute: 2.8 10*3/uL (ref 1.4–7.0)
Neutrophils: 73 %
Platelets: 94 10*3/uL — CL (ref 150–450)
RBC: 4.51 x10E6/uL (ref 4.14–5.80)
RDW: 13.7 % (ref 11.6–15.4)
WBC: 3.8 10*3/uL (ref 3.4–10.8)

## 2023-12-10 NOTE — Progress Notes (Signed)
Platelets are really low. Stop your ASA for right now and recheck platelets on Monday.

## 2023-12-13 ENCOUNTER — Other Ambulatory Visit: Payer: Self-pay | Admitting: Physician Assistant

## 2023-12-13 DIAGNOSIS — D696 Thrombocytopenia, unspecified: Secondary | ICD-10-CM

## 2023-12-14 LAB — CBC WITH DIFFERENTIAL/PLATELET
Basophils Absolute: 0 10*3/uL (ref 0.0–0.2)
Basos: 1 %
EOS (ABSOLUTE): 0 10*3/uL (ref 0.0–0.4)
Eos: 1 %
Hematocrit: 39 % (ref 37.5–51.0)
Hemoglobin: 13.3 g/dL (ref 13.0–17.7)
Immature Grans (Abs): 0 10*3/uL (ref 0.0–0.1)
Immature Granulocytes: 0 %
Lymphocytes Absolute: 0.9 10*3/uL (ref 0.7–3.1)
Lymphs: 15 %
MCH: 29.6 pg (ref 26.6–33.0)
MCHC: 34.1 g/dL (ref 31.5–35.7)
MCV: 87 fL (ref 79–97)
Monocytes Absolute: 0.4 10*3/uL (ref 0.1–0.9)
Monocytes: 6 %
Neutrophils Absolute: 4.9 10*3/uL (ref 1.4–7.0)
Neutrophils: 77 %
Platelets: 106 10*3/uL — ABNORMAL LOW (ref 150–450)
RBC: 4.5 x10E6/uL (ref 4.14–5.80)
RDW: 13.5 % (ref 11.6–15.4)
WBC: 6.3 10*3/uL (ref 3.4–10.8)

## 2023-12-17 NOTE — Progress Notes (Signed)
Platelets coming up some and reassuring.

## 2023-12-28 ENCOUNTER — Other Ambulatory Visit: Payer: Self-pay | Admitting: Physician Assistant

## 2023-12-28 DIAGNOSIS — I10 Essential (primary) hypertension: Secondary | ICD-10-CM

## 2024-01-07 ENCOUNTER — Ambulatory Visit: Payer: Self-pay | Admitting: Physician Assistant

## 2024-01-10 ENCOUNTER — Ambulatory Visit (INDEPENDENT_AMBULATORY_CARE_PROVIDER_SITE_OTHER): Payer: Medicaid Other | Admitting: Physician Assistant

## 2024-01-10 VITALS — BP 179/88 | HR 83 | Ht 70.0 in | Wt 246.0 lb

## 2024-01-10 DIAGNOSIS — I6789 Other cerebrovascular disease: Secondary | ICD-10-CM

## 2024-01-10 DIAGNOSIS — I10 Essential (primary) hypertension: Secondary | ICD-10-CM | POA: Diagnosis not present

## 2024-01-10 DIAGNOSIS — E1165 Type 2 diabetes mellitus with hyperglycemia: Secondary | ICD-10-CM

## 2024-01-10 DIAGNOSIS — G8191 Hemiplegia, unspecified affecting right dominant side: Secondary | ICD-10-CM | POA: Diagnosis not present

## 2024-01-10 DIAGNOSIS — E785 Hyperlipidemia, unspecified: Secondary | ICD-10-CM | POA: Diagnosis not present

## 2024-01-10 DIAGNOSIS — T887XXA Unspecified adverse effect of drug or medicament, initial encounter: Secondary | ICD-10-CM | POA: Diagnosis not present

## 2024-01-10 DIAGNOSIS — Z8673 Personal history of transient ischemic attack (TIA), and cerebral infarction without residual deficits: Secondary | ICD-10-CM

## 2024-01-10 DIAGNOSIS — Z7984 Long term (current) use of oral hypoglycemic drugs: Secondary | ICD-10-CM | POA: Diagnosis not present

## 2024-01-10 LAB — POCT GLYCOSYLATED HEMOGLOBIN (HGB A1C): Hemoglobin A1C: 6.8 % — AB (ref 4.0–5.6)

## 2024-01-10 MED ORDER — TIRZEPATIDE 5 MG/0.5ML ~~LOC~~ SOAJ: 5.0000 mg | SUBCUTANEOUS | 1 refills | Status: DC

## 2024-01-10 MED ORDER — MOUNJARO 2.5 MG/0.5ML ~~LOC~~ SOAJ: 2.5000 mg | SUBCUTANEOUS | 0 refills | Status: DC

## 2024-01-10 MED ORDER — AMLODIPINE BESYLATE 5 MG PO TABS: 5.0000 mg | ORAL_TABLET | Freq: Every day | ORAL | 0 refills | Status: DC

## 2024-01-10 NOTE — Progress Notes (Unsigned)
   Established Patient Office Visit  Subjective   Patient ID: Brandon Mckee, male    DOB: 06-28-1966  Age: 58 y.o. MRN: [id number]  No chief complaint on file.   HPI  Last A1c 11.3  Plavix  Potassium   Released from PT and OT   {History (Optional):23778}  ROS    Objective:     There were no vitals taken for this visit. BP Readings from Last 3 Encounters:  01/10/24 (!) 179/88  12/03/23 (!) 187/94  08/21/22 (!) 176/107   Wt Readings from Last 3 Encounters:  01/10/24 246 lb (111.6 kg)  12/03/23 240 lb 11.2 oz (109.2 kg)  08/21/22 263 lb (119.3 kg)      Physical Exam       Assessment & Plan:   Problem List Items Addressed This Visit   None   No follow-ups on file.    Tandy Gaw, PA-C

## 2024-01-10 NOTE — Patient Instructions (Addendum)
Ok to drive Stay on lisinopril and add norvasc 5mg  daily Stay on rousuvastatin Stary on metformin Stop glipizide Add mounjaro weekly  .[name] KitchenLong-Term Secondary Stroke Prevention Goals & Recommendations: -Goal blood pressure < 130/80 mmHg  -Goal LDL < 70 -Goal Hemoglobin O1H < 7  -Smoking Cessation (if applicable)  -Obstructive Sleep Apnea management (if applicable) -Low-Sodium Mediterranean diet  -Medication Compliance  -Regular Exercise (30 minutes per day, five days per week)

## 2024-01-11 ENCOUNTER — Encounter: Payer: Self-pay | Admitting: Physician Assistant

## 2024-01-11 MED ORDER — ONDANSETRON 8 MG PO TBDP: 8.0000 mg | ORAL_TABLET | Freq: Three times a day (TID) | ORAL | 1 refills | Status: AC | PRN

## 2024-01-24 ENCOUNTER — Ambulatory Visit (INDEPENDENT_AMBULATORY_CARE_PROVIDER_SITE_OTHER): Payer: Medicaid Other | Admitting: Physician Assistant

## 2024-01-24 VITALS — BP 154/90 | HR 79 | Temp 98.8°F | Ht 70.0 in | Wt 241.0 lb

## 2024-01-24 DIAGNOSIS — I10 Essential (primary) hypertension: Secondary | ICD-10-CM

## 2024-01-24 MED ORDER — AMLODIPINE BESYLATE 10 MG PO TABS: 10.0000 mg | ORAL_TABLET | Freq: Every day | ORAL | 0 refills | Status: DC

## 2024-01-24 NOTE — Progress Notes (Signed)
Patient is here for blood pressure check. Denies trouble sleeping, palpitations, dizziness, lightheadedness, blurry vision, chest pain, shortness of breath, headaches and/or medication problems.   Patient's blood pressure was out of goal range 173/93, pulse 74. Patient sat for 15 minutes. Blood pressure recheck was not at goal 154/90, pulse 79. Provider notified of current blood pressure reading. Per provider, patient is to increase Norvasc rx to 10 mg.   Patient informed to schedule next NV appointment in 2 weeks.

## 2024-01-24 NOTE — Progress Notes (Signed)
Agree with above plan. 

## 2024-01-31 ENCOUNTER — Encounter: Payer: Self-pay | Admitting: Physician Assistant

## 2024-01-31 ENCOUNTER — Telehealth: Payer: Self-pay

## 2024-01-31 NOTE — Telephone Encounter (Signed)
 Copied from CRM [id number]. Topic: Clinical - Medication Question >> Jan 31, 2024 10:44 AM Hilton Lucky wrote: Reason for CRM: Mounjaro (5MG ) needs a prior authorization. Patient requesting PA be submitted.

## 2024-02-07 ENCOUNTER — Ambulatory Visit (INDEPENDENT_AMBULATORY_CARE_PROVIDER_SITE_OTHER): Payer: Medicaid Other | Admitting: Physician Assistant

## 2024-02-07 VITALS — BP 166/92 | HR 75 | Ht 70.0 in | Wt 241.0 lb

## 2024-02-07 DIAGNOSIS — I10 Essential (primary) hypertension: Secondary | ICD-10-CM

## 2024-02-07 MED ORDER — HYDROCHLOROTHIAZIDE 25 MG PO TABS: 25.0000 mg | ORAL_TABLET | Freq: Every day | ORAL | 0 refills | Status: DC

## 2024-02-07 NOTE — Progress Notes (Signed)
BP not to goal.  Continue norvasc 10mg  Continue lisinopril 40mg  Add hydrochlorothiazide 25mg  daily in the morning.   Recheck BP in 2 weeks.

## 2024-02-07 NOTE — Progress Notes (Signed)
HPI  Pt here for BP recheck last BP 154/90. Denies headaches, CP, medication problems.                           Assessment and Plan:  Pt bp is still not stable at 166/92. Lesly Rubenstein is going to add a third BP medication. Hydrochlorothiazide and pt is follow up in 2wks for nurse visit

## 2024-02-08 ENCOUNTER — Other Ambulatory Visit: Payer: Self-pay | Admitting: Physician Assistant

## 2024-02-08 DIAGNOSIS — E1165 Type 2 diabetes mellitus with hyperglycemia: Secondary | ICD-10-CM

## 2024-02-08 DIAGNOSIS — E785 Hyperlipidemia, unspecified: Secondary | ICD-10-CM

## 2024-02-08 DIAGNOSIS — Z8673 Personal history of transient ischemic attack (TIA), and cerebral infarction without residual deficits: Secondary | ICD-10-CM

## 2024-02-18 NOTE — Telephone Encounter (Signed)
 Prior auth for: MOUNJARO 5 MG Determination: Pending as of 02/18/24 Auth #: [id number] Valid from: n/a Patient notified via MyChart

## 2024-02-19 NOTE — Telephone Encounter (Signed)
 Prior auth for: MOUNJARO 5 MG Determination: DENIED Auth #: [id number] Reason: Per your health plan's criteria, this drug is covered if you meet the following: If the request is for a non-preferred drug, you have tried two preferred drugs (or your doctor provides clinical reason why you cannot use the drugs): Ozempic, Trulicity and Victoza.

## 2024-02-21 ENCOUNTER — Ambulatory Visit: Payer: Medicaid Other

## 2024-02-28 ENCOUNTER — Encounter: Payer: Self-pay | Admitting: Physician Assistant

## 2024-02-28 ENCOUNTER — Ambulatory Visit (INDEPENDENT_AMBULATORY_CARE_PROVIDER_SITE_OTHER): Admitting: Physician Assistant

## 2024-02-28 VITALS — BP 149/80 | HR 71 | Ht 70.0 in | Wt 245.0 lb

## 2024-02-28 DIAGNOSIS — I10 Essential (primary) hypertension: Secondary | ICD-10-CM

## 2024-02-28 MED ORDER — HYDRALAZINE HCL 25 MG PO TABS: 25.0000 mg | ORAL_TABLET | Freq: Two times a day (BID) | ORAL | 0 refills | Status: DC

## 2024-02-28 NOTE — Progress Notes (Signed)
 Pt presents to clinic today for BP check.   Denies any cp/sob/palpitaions/headaches/dizziness or swelling.   Initial check of BP was elevated I had pt to sit before checking again.  2nd BP was still elevated.  Pt will now be starting Hydralazine 25 mg BID and will need to f/u with PCP in 2 weeks.

## 2024-02-28 NOTE — Progress Notes (Signed)
 BP still not to goal. 2nd recheck better than first.  On lisinopril 40mg  Hydrochlorothiazide 25mg   Norvasc 10mg  Added hydralazine 25mg  twice a day  Follow up with me in 2 weeks.

## 2024-03-01 ENCOUNTER — Other Ambulatory Visit: Payer: Self-pay | Admitting: Physician Assistant

## 2024-03-14 ENCOUNTER — Ambulatory Visit: Admitting: Physician Assistant

## 2024-03-20 ENCOUNTER — Encounter: Payer: Self-pay | Admitting: Physician Assistant

## 2024-03-20 ENCOUNTER — Other Ambulatory Visit: Payer: Self-pay | Admitting: Physician Assistant

## 2024-03-20 ENCOUNTER — Ambulatory Visit (INDEPENDENT_AMBULATORY_CARE_PROVIDER_SITE_OTHER): Admitting: Physician Assistant

## 2024-03-20 ENCOUNTER — Telehealth: Payer: Self-pay

## 2024-03-20 VITALS — BP 160/90 | HR 96 | Ht 70.0 in | Wt 245.0 lb

## 2024-03-20 DIAGNOSIS — Z8673 Personal history of transient ischemic attack (TIA), and cerebral infarction without residual deficits: Secondary | ICD-10-CM

## 2024-03-20 DIAGNOSIS — E1165 Type 2 diabetes mellitus with hyperglycemia: Secondary | ICD-10-CM

## 2024-03-20 DIAGNOSIS — I10 Essential (primary) hypertension: Secondary | ICD-10-CM | POA: Diagnosis not present

## 2024-03-20 DIAGNOSIS — E785 Hyperlipidemia, unspecified: Secondary | ICD-10-CM

## 2024-03-20 MED ORDER — HYDRALAZINE HCL 50 MG PO TABS: 50.0000 mg | ORAL_TABLET | Freq: Three times a day (TID) | ORAL | 0 refills | Status: DC

## 2024-03-20 NOTE — Patient Instructions (Signed)
 Targeting BP:  Norvasc 10mg  daily Lisinopril 40mg  daily Hydrochlorothiazide 25mg  daily Hydralazine 50mg  three times a day

## 2024-03-20 NOTE — Telephone Encounter (Signed)
 Copied from CRM [id number]. Topic: Clinical - Medication Refill >> Mar 20, 2024 11:08 AM Higinio Roger wrote: Most Recent Primary Care Visit:  Provider: Jomarie Longs  Department: [facility]  Visit Type: OFFICE VISIT  Date: 03/20/2024  Medication: rosuvastatin (CRESTOR) 20 MG tablet  Has the patient contacted their pharmacy? Yes (Agent: If no, request that the patient contact the pharmacy for the refill. If patient does not wish to contact the pharmacy document the reason why and proceed with request.) (Agent: If yes, when and what did the pharmacy advise?) No refills  Is this the correct pharmacy for this prescription? Yes If no, delete pharmacy and type the correct one.  This is the patient's preferred pharmacy:   [id number] - [place], [place] - [address] [place] [address] Phone: [phone] Fax: [phone]  Has the prescription been filled recently? Yes  Is the patient out of the medication? Yes  Has the patient been seen for an appointment in the last year OR does the patient have an upcoming appointment? Yes  Can we respond through MyChart? Yes  Agent: Please be advised that Rx refills may take up to 3 business days. We ask that you follow-up with your pharmacy.

## 2024-03-20 NOTE — Telephone Encounter (Signed)
 Initiated Prior authorization ZOX:[id number] INJ 5MG  Via: Covermymeds Case/Key:[id number] Status: approved as of 03/20/24 Reason:Request Reference Number: . MOUNJARO INJ 5MG /0.5 is approved through 03/20/2025. For further questions, call [employer] at [phone].[name] Authorization Expiration Date: March 20, 2025. Notified Pt via: Mychart

## 2024-03-20 NOTE — Progress Notes (Unsigned)
   Established Patient Office Visit  Subjective   Patient ID: Brandon Mckee, male    DOB: 1966-12-04  Age: 58 y.o. MRN: [id number]  Chief Complaint  Patient presents with   Medical Management of Chronic Issues    Med check ,med refill    HPI Pt is a 58 yo obese male who presents to the clinic to follow up on HTN. Pt has a high CV risk and hx of recent stroke.   He reports to be compliant with taking medication. He is on lisinopril, amlodipine, hydralazine, and hydrochlorothiazide for blood pressure. He is not checking his BP at home. He denies any headaches, CP, palpitations, vision changes, SOB.   He does report to be out of mounjaro and needs refills for diabetes.    Objective:     BP (!) 160/90   Pulse 96   Ht 5\' 10"  (1.778 m)   Wt 245 lb (111.1 kg)   SpO2 99%   BMI 35.15 kg/m  BP Readings from Last 3 Encounters:  03/20/24 (!) 160/90  02/28/24 (!) 149/80  02/07/24 (!) 166/92   Wt Readings from Last 3 Encounters:  03/20/24 245 lb (111.1 kg)  02/28/24 245 lb (111.1 kg)  02/07/24 241 lb (109.3 kg)      Physical Exam Constitutional:      Appearance: Normal appearance. He is obese.  HENT:     Head: Normocephalic.  Cardiovascular:     Rate and Rhythm: Normal rate and regular rhythm.  Pulmonary:     Effort: Pulmonary effort is normal.     Breath sounds: Normal breath sounds.  Musculoskeletal:     Right lower leg: No edema.     Left lower leg: No edema.  Neurological:     General: No focal deficit present.     Mental Status: He is alert and oriented to person, place, and time.  Psychiatric:        Mood and Affect: Mood normal.         Assessment & Plan:  [name] KitchenMarland KitchenKiara was seen today for medical management of chronic issues.  Diagnoses and all orders for this visit:  Essential hypertension -     hydrALAZINE (APRESOLINE) 50 MG tablet; Take 1 tablet (50 mg total) by mouth 3 (three) times daily.  Poorly-controlled hypertension -     hydrALAZINE  (APRESOLINE) 50 MG tablet; Take 1 tablet (50 mg total) by mouth 3 (three) times daily.   BP not to goal even on 2nd recheck Encouraged patient to get a cuff and start checking at home and keep a log   Norvasc 10mg  daily Lisinopril 40mg  daily Hydrochlorothiazide 25mg  daily Hydralazine 50mg  three times a day  Increased hydralazine to 50mg  and made 3 times a day  Discussed importance of BP control Encouraged low salt diet and regular exercise Follow up in 1 month   Tandy Gaw, PA-C

## 2024-03-21 ENCOUNTER — Encounter: Payer: Self-pay | Admitting: Physician Assistant

## 2024-03-21 ENCOUNTER — Other Ambulatory Visit: Payer: Self-pay | Admitting: Physician Assistant

## 2024-03-21 DIAGNOSIS — E1165 Type 2 diabetes mellitus with hyperglycemia: Secondary | ICD-10-CM

## 2024-03-21 DIAGNOSIS — Z8673 Personal history of transient ischemic attack (TIA), and cerebral infarction without residual deficits: Secondary | ICD-10-CM

## 2024-03-21 DIAGNOSIS — E785 Hyperlipidemia, unspecified: Secondary | ICD-10-CM

## 2024-03-21 MED ORDER — ROSUVASTATIN CALCIUM 20 MG PO TABS: 20.0000 mg | ORAL_TABLET | Freq: Every day | ORAL | 3 refills | Status: AC

## 2024-04-10 ENCOUNTER — Ambulatory Visit (INDEPENDENT_AMBULATORY_CARE_PROVIDER_SITE_OTHER): Payer: Medicaid Other | Admitting: Physician Assistant

## 2024-04-10 VITALS — BP 157/83 | HR 104 | Ht 70.0 in | Wt 244.0 lb

## 2024-04-10 DIAGNOSIS — H1013 Acute atopic conjunctivitis, bilateral: Secondary | ICD-10-CM

## 2024-04-10 DIAGNOSIS — Z7984 Long term (current) use of oral hypoglycemic drugs: Secondary | ICD-10-CM | POA: Diagnosis not present

## 2024-04-10 DIAGNOSIS — E1165 Type 2 diabetes mellitus with hyperglycemia: Secondary | ICD-10-CM

## 2024-04-10 DIAGNOSIS — I10 Essential (primary) hypertension: Secondary | ICD-10-CM | POA: Diagnosis not present

## 2024-04-10 LAB — POCT GLYCOSYLATED HEMOGLOBIN (HGB A1C): Hemoglobin A1C: 7.2 % — AB (ref 4.0–5.6)

## 2024-04-10 MED ORDER — AZELASTINE HCL 0.05 % OP SOLN: 1.0000 [drp] | Freq: Two times a day (BID) | OPHTHALMIC | 11 refills | Status: AC

## 2024-04-10 MED ORDER — VALSARTAN-HYDROCHLOROTHIAZIDE 160-25 MG PO TABS
1.0000 | ORAL_TABLET | Freq: Every day | ORAL | 3 refills | Status: DC
Start: 2024-04-10 — End: 2024-07-10

## 2024-04-10 MED ORDER — METFORMIN HCL 1000 MG PO TABS: ORAL_TABLET | ORAL | 1 refills | Status: DC

## 2024-04-10 MED ORDER — AMLODIPINE BESYLATE 10 MG PO TABS: 10.0000 mg | ORAL_TABLET | Freq: Every day | ORAL | 0 refills | Status: DC

## 2024-04-10 MED ORDER — TIRZEPATIDE 7.5 MG/0.5ML ~~LOC~~ SOAJ: 7.5000 mg | SUBCUTANEOUS | 0 refills | Status: DC

## 2024-04-10 NOTE — Patient Instructions (Addendum)
 Increase mounjaro to 7.5mg  weekly.  Continue norvasc (amlodipine ) and hydralazine   Stop lisinopril  and hydrochlorothiazide  Start diovan /HCT daily in them morning  Optivar  drops for allergies symptoms in eyes Zyrtec at bedtime during allergy season can help as well

## 2024-04-10 NOTE — Progress Notes (Unsigned)
 Established Patient Office Visit  Subjective   Patient ID: Brandon Mckee, male    DOB: 06-03-1966  Age: 58 y.o. MRN: [id number]  Chief Complaint  Patient presents with   Medical Management of Chronic Issues    Last A1c 6.8    HPI Pt is a 58 yo obese male with T2DM, HTN, HLD, hx of stroke who presents to the clinic for 3 month follow up.   Pt reports doing well and taking all medication. He denies any hypoglycemic events. He is not checking his sugars. He denies any CP, palpitations, headaches or dizziness. He is not checking BP at home either. He is trying to stay active but no exercise.   He is having a lot of allergy symptoms the worst being watery itchy eyes. Using OTC drops but do not seem to help. Not taking any daily anti-histamine.    Review of Systems  All other systems reviewed and are negative.     Objective:     BP (!) 157/83   Pulse (!) 104   Ht 5\' 10"  (1.778 m)   Wt 244 lb (110.7 kg)   SpO2 99%   BMI 35.01 kg/m  BP Readings from Last 3 Encounters:  04/10/24 (!) 157/83  03/20/24 (!) 160/90  02/28/24 (!) 149/80   Wt Readings from Last 3 Encounters:  04/10/24 244 lb (110.7 kg)  03/20/24 245 lb (111.1 kg)  02/28/24 245 lb (111.1 kg)      Physical Exam Constitutional:      Appearance: Normal appearance. He is obese.  HENT:     Head: Normocephalic.  Cardiovascular:     Rate and Rhythm: Normal rate and regular rhythm.     Pulses: Normal pulses.  Pulmonary:     Effort: Pulmonary effort is normal.     Breath sounds: Normal breath sounds.  Musculoskeletal:     Right lower leg: No edema.     Left lower leg: No edema.  Neurological:     General: No focal deficit present.     Mental Status: He is alert and oriented to person, place, and time.  Psychiatric:        Mood and Affect: Mood normal.      Results for orders placed or performed in visit on 04/10/24  POCT HgB A1C  Result Value Ref Range   Hemoglobin A1C 7.2 (A) 4.0 - 5.6 %    HbA1c POC (<> result, manual entry)     HbA1c, POC (prediabetic range)     HbA1c, POC (controlled diabetic range)       Assessment & Plan:  Brandon Mckee was seen today for medical management of chronic issues.  Diagnoses and all orders for this visit:  Type 2 diabetes mellitus with hyperglycemia, without long-term current use of insulin (HCC) -     POCT HgB A1C -     tirzepatide (MOUNJARO) 7.5 MG/0.5ML Pen; Inject 7.5 mg into the skin once a week. -     metFORMIN  (GLUCOPHAGE ) 1000 MG tablet; TAKE 1 TABLET (1,000 MG TOTAL) BY MOUTH TWICE A DAY WITH FOOD  Poorly-controlled hypertension -     valsartan -hydrochlorothiazide  (DIOVAN -HCT) 160-25 MG tablet; Take 1 tablet by mouth daily. -     amLODipine  (NORVASC ) 10 MG tablet; Take 1 tablet (10 mg total) by mouth daily.  Essential hypertension -     valsartan -hydrochlorothiazide  (DIOVAN -HCT) 160-25 MG tablet; Take 1 tablet by mouth daily. -     amLODipine  (NORVASC ) 10 MG tablet; Take 1 tablet (  10 mg total) by mouth daily.  Allergic conjunctivitis of both eyes -     azelastine  (OPTIVAR ) 0.05 % ophthalmic solution; Place 1 drop into both eyes 2 (two) times daily.  A1C not to goal Discussed DM diet and importance of exercise Increased mounjaro to 7.5mg  weekly Continue metformin  BP not to goal Stop lisinopril  and HcTZ start diovan /HCT and continue amlodapine On statin Need eye exam Declined covid/pneumonia/flu/shingles vaccines Follow up in 3 months for A1C and 2 weeks for BP recheck  Discussed allergic conjunctivitis Start optivar  Consider daily anti-histamine to help with all allergy symptoms like claritin or zyrtec      Return for 2 week nurse visit BP check/ 3 month visit with me for DM.    Taos Tapp, PA-C

## 2024-04-11 ENCOUNTER — Encounter: Payer: Self-pay | Admitting: Physician Assistant

## 2024-04-12 ENCOUNTER — Other Ambulatory Visit: Payer: Self-pay | Admitting: Physician Assistant

## 2024-04-12 DIAGNOSIS — I10 Essential (primary) hypertension: Secondary | ICD-10-CM

## 2024-04-24 ENCOUNTER — Ambulatory Visit (INDEPENDENT_AMBULATORY_CARE_PROVIDER_SITE_OTHER): Admitting: Physician Assistant

## 2024-04-24 VITALS — BP 139/80 | HR 53

## 2024-04-24 DIAGNOSIS — I10 Essential (primary) hypertension: Secondary | ICD-10-CM

## 2024-04-24 NOTE — Progress Notes (Signed)
 BP much better. For now stay on same medications.  Keep 3 month DM follow ups.

## 2024-04-24 NOTE — Progress Notes (Signed)
 Pt presents here in clinic for blood pressure recheck and, denies trouble sleeping,palpitations or medication changes, however pt did states he was having issues getting Mounjaro due to needing prior authorization , pa was ran pa approved 2025-03-20.pt informed ,pharmacy will dispense medications this afternoon.

## 2024-06-08 DIAGNOSIS — E785 Hyperlipidemia, unspecified: Secondary | ICD-10-CM | POA: Diagnosis not present

## 2024-06-08 DIAGNOSIS — I69320 Aphasia following cerebral infarction: Secondary | ICD-10-CM | POA: Diagnosis not present

## 2024-06-08 DIAGNOSIS — E11649 Type 2 diabetes mellitus with hypoglycemia without coma: Secondary | ICD-10-CM | POA: Diagnosis not present

## 2024-06-08 DIAGNOSIS — I672 Cerebral atherosclerosis: Secondary | ICD-10-CM | POA: Diagnosis not present

## 2024-07-10 ENCOUNTER — Ambulatory Visit (INDEPENDENT_AMBULATORY_CARE_PROVIDER_SITE_OTHER)

## 2024-07-10 VITALS — BP 141/76 | HR 91 | Ht 70.0 in | Wt 243.5 lb

## 2024-07-10 DIAGNOSIS — I10 Essential (primary) hypertension: Secondary | ICD-10-CM | POA: Diagnosis not present

## 2024-07-10 MED ORDER — VALSARTAN-HYDROCHLOROTHIAZIDE 320-25 MG PO TABS: 1.0000 | ORAL_TABLET | Freq: Every day | ORAL | 0 refills | Status: DC

## 2024-07-10 NOTE — Progress Notes (Signed)
   Established Patient Office Visit  Subjective   Patient ID: Brandon Mckee, male    DOB: 02-Mar-1966  Age: 58 y.o. MRN: [id number]  Chief Complaint  Patient presents with   Hypertension    BP check - nurse visit ==    HPI  Hypertension. BP check nurse  visit. Patient denies chest pain, shortness of breath, dizziness, palpitation , vision changes or medication problems.  Patient does state that he is having difficulty taking the mid day dose of Hydralazine  50mg  that he takes 3 times per day.   ROS    Objective:     BP (!) 141/76   Pulse 91   Ht 5' 10 (1.778 m)   Wt 243 lb 8 oz (110.5 kg)   SpO2 100%   BMI 34.94 kg/m    Physical Exam   No results found for any visits on 07/10/24.    The ASCVD Risk score (Arnett DK, et al., 2019) failed to calculate for the following reasons:   Risk score cannot be calculated because patient has a medical history suggesting prior/existing ASCVD    Assessment & Plan:  BP Check nurse visit. Initial reading= 151/78 and second reading= 141/76.Per Vermell Bologna ,PA will increase valsartan /hydrochlorothiazide   to 320mg  / 25mg  . Patient will continue amlodipine   10mg  and hydralazine  50mg  TID -  Return in 2 weeks for nurse visit.  Problem List Items Addressed This Visit       Cardiovascular and Mediastinum   Essential hypertension - Primary    Return in about 2 weeks (around 07/24/2024) for nurse visit .    Suzen SHAUNNA Plenty, LPN

## 2024-07-10 NOTE — Progress Notes (Signed)
 Sent to pharmacy

## 2024-07-10 NOTE — Patient Instructions (Signed)
Return in 2 weeks for nurse visit for BP check.  

## 2024-07-17 ENCOUNTER — Ambulatory Visit (INDEPENDENT_AMBULATORY_CARE_PROVIDER_SITE_OTHER): Admitting: Sports Medicine

## 2024-07-17 ENCOUNTER — Ambulatory Visit

## 2024-07-17 DIAGNOSIS — M170 Bilateral primary osteoarthritis of knee: Secondary | ICD-10-CM

## 2024-07-17 DIAGNOSIS — M25561 Pain in right knee: Secondary | ICD-10-CM | POA: Diagnosis not present

## 2024-07-17 DIAGNOSIS — M47812 Spondylosis without myelopathy or radiculopathy, cervical region: Secondary | ICD-10-CM

## 2024-07-17 DIAGNOSIS — M25562 Pain in left knee: Secondary | ICD-10-CM | POA: Diagnosis not present

## 2024-07-17 DIAGNOSIS — M542 Cervicalgia: Secondary | ICD-10-CM | POA: Diagnosis not present

## 2024-07-17 MED ORDER — PREDNISONE 50 MG PO TABS: ORAL_TABLET | ORAL | 0 refills | Status: DC

## 2024-07-17 MED ORDER — MELOXICAM 15 MG PO TABS: ORAL_TABLET | ORAL | 3 refills | Status: AC

## 2024-07-17 NOTE — Progress Notes (Signed)
    Procedures performed today:    None.  Independent interpretation of notes and tests performed by another provider:   None.  Brief History, Exam, Impression, and Recommendations:    Primary osteoarthritis of both knees Very pleasant 58 year old male, known knee osteoarthritis, last injection was in 2018, he did well. Now having increasing knee pain, adding x-rays, formal PT, prednisone  as above, meloxicam  after that, return to see me 6 weeks, injection if not better.  Cervical spondylosis Pleasant 58 year old male with history of a CVA with initially some right sided hemiparesis but with resolution of his weakness has had months of pain posterior neck, occasional periscapular radiation but nothing overtly radicular down to the hand or fingertips. No red flag symptoms such as progressive weakness, fevers or chills. We will add x-rays, formal PT, prednisone , meloxicam , return to see me in 6 weeks, MR for interventional planning if not better.    ____________________________________________ Debby PARAS. Curtis, M.D., ABFM., CAQSM., AME. Primary Care and Sports Medicine [facility]  Adjunct Professor of [facility] Medicine  [employer]  Restaurant manager, fast food

## 2024-07-17 NOTE — Assessment & Plan Note (Signed)
 Pleasant 58 year old male with history of a CVA with initially some right sided hemiparesis but with resolution of his weakness has had months of pain posterior neck, occasional periscapular radiation but nothing overtly radicular down to the hand or fingertips. No red flag symptoms such as progressive weakness, fevers or chills. We will add x-rays, formal PT, prednisone , meloxicam , return to see me in 6 weeks, MR for interventional planning if not better.

## 2024-07-17 NOTE — Assessment & Plan Note (Signed)
 Very pleasant 58 year old male, known knee osteoarthritis, last injection was in 2018, he did well. Now having increasing knee pain, adding x-rays, formal PT, prednisone  as above, meloxicam  after that, return to see me 6 weeks, injection if not better.

## 2024-07-18 ENCOUNTER — Other Ambulatory Visit: Payer: Self-pay

## 2024-07-18 ENCOUNTER — Ambulatory Visit: Attending: Sports Medicine

## 2024-07-18 DIAGNOSIS — M6281 Muscle weakness (generalized): Secondary | ICD-10-CM | POA: Insufficient documentation

## 2024-07-18 DIAGNOSIS — M25561 Pain in right knee: Secondary | ICD-10-CM | POA: Diagnosis present

## 2024-07-18 DIAGNOSIS — G8929 Other chronic pain: Secondary | ICD-10-CM | POA: Insufficient documentation

## 2024-07-18 DIAGNOSIS — M542 Cervicalgia: Secondary | ICD-10-CM | POA: Diagnosis present

## 2024-07-18 DIAGNOSIS — M25562 Pain in left knee: Secondary | ICD-10-CM | POA: Insufficient documentation

## 2024-07-18 DIAGNOSIS — M170 Bilateral primary osteoarthritis of knee: Secondary | ICD-10-CM | POA: Diagnosis not present

## 2024-07-18 NOTE — Therapy (Signed)
 [facility]ALUATION   Patient Name: Brandon Mckee MRN: [id number] DOB:12/12/1966, 58 y.o., male Today's Date: 07/19/2024  END OF SESSION:  PT End of Session - 07/18/24 1527     Visit Number 1    Number of Visits 17    Date for PT Re-Evaluation 09/16/24    Authorization Type [employer] MCD    Authorization - Visit Number 1    Authorization - Number of Visits 27    PT Start Time 1529    PT Stop Time 1607    PT Time Calculation (min) 38 min    Activity Tolerance Patient tolerated treatment well    Behavior During Therapy WFL for tasks assessed/performed          Past Medical History:  Diagnosis Date   Hyperlipidemia    Hypertension    Metabolic syndrome    IFG   Pes anserine bursitis    bilateral   Past Surgical History:  Procedure Laterality Date   APPENDECTOMY     Patient Active Problem List   Diagnosis Date Noted   Cervical spondylosis 07/17/2024   History of CVA (cerebrovascular accident) 12/06/2023   Thrombocytopenia (HCC) 12/06/2023   Hypokalemia 12/06/2023   Uncontrolled type 2 diabetes mellitus with hyperglycemia (HCC) 12/06/2023   Poorly-controlled hypertension 12/06/2023   Type 2 diabetes mellitus with hyperglycemia, without long-term current use of insulin (HCC) 12/06/2023   Hemiparesis of right dominant side due to acute cerebrovascular disease (HCC) 12/06/2023   Allergic reaction 07/16/2020   Gastroesophageal reflux disease with esophagitis 05/13/2018   Chronic gout of multiple sites 05/13/2018   Obesity (BMI 30-39.9) 09/10/2017   Grief reaction 08/09/2017   Benign prostatic hyperplasia without lower urinary tract symptoms 04/25/2017   Colon polyps 01/01/2017   Diverticulosis of colon without hemorrhage 01/01/2017   Trapezius muscle spasm 11/24/2016   Hypogonadism in male 07/03/2016   Microalbuminuria 04/01/2016   Osteoarthritis of right acromioclavicular joint 01/17/2016   Dyslipidemia 01/07/2016   Vitamin D  deficiency  01/07/2016   Depression 01/03/2016   Anxiety disorder 01/03/2016   Essential hypertension 01/03/2016   Pre-diabetes 12/07/2014   Male hypogonadism 05/25/2014   Obstructive sleep apnea 10/11/2013   B12 DEFICIENCY 06/30/2010   LEG EDEMA 05/03/2009   Primary osteoarthritis of both knees 12/27/2008   FATIGUE 09/25/2008   DYSMETABOLIC SYNDROME 08/04/2007   Hyperlipidemia LDL goal <70 12/08/2006   Class 2 severe obesity due to excess calories with serious comorbidity and body mass index (BMI) of 35.0 to 35.9 in adult [facility]) 12/08/2006    PCP: Antoniette Vermell CROME, PA-C  REFERRING PROVIDER: Curtis Debby PARAS, MD   REFERRING DIAG: M17.0 (ICD-10-CM) - Primary osteoarthritis of both knees; cervical spondylosis   THERAPY DIAG:  Chronic pain of both knees  Cervicalgia  Muscle weakness (generalized)  Rationale for Evaluation and Treatment: Rehabilitation  ONSET DATE: chronic   SUBJECTIVE:   SUBJECTIVE STATEMENT: Patient reports since 2008 he has been receiving knee injections for his pain. Then due to lack of insurance he went a few years without injections. Latest injection was in 2018. He feels that his knee pain is worsening and he can't even walk up hills. No popping/clicking in the knees. No instability. The pain is along the medial aspect of bilateral knees.   Patient reports off/on neck pain, but more recently the pain has been more constant since CVA in December 2024. He reports the pain has been ongoing for at least a year. He attributes his pain to repetitive use  of his upper body/previous work activity. He denies any numbness/tingling. No radicular symptoms.   PERTINENT HISTORY: CVA with Rt-sided hemiparesis  Obesity  PAIN:  Are you having pain? Yes: NPRS scale: 4 currently, at worst 8 (KNEES); 2 currently, at worst 7 (NECK) Pain location: bilateral medial knees and posterior neck Pain description: throbbing (knees); tight (neck) Aggravating factors: walking up hills,  carrying items(knees); neck movement, sleep (neck) Relieving factors: topical analgesic   PRECAUTIONS: None  RED FLAGS: None   WEIGHT BEARING RESTRICTIONS: No  FALLS:  Has patient fallen in last 6 months? No  LIVING ENVIRONMENT: Lives with: lives with their daughter Lives in: House/apartment Stairs: No Has following equipment at home: [name] - 2 wheeled, shower chair, and bed side commode  OCCUPATION: part time- works at [employer]  PLOF: Independent  PATIENT GOALS: relieve a little bit of the pain.   NEXT MD VISIT: nothing scheduled   OBJECTIVE:  Note: Objective measures were completed at Evaluation unless otherwise noted.  DIAGNOSTIC FINDINGS: X-rays pending   PATIENT SURVEYS:  Patient-specific activity scoring scheme (Point to one number):  0 represents "unable to perform." 10 represents "able to perform at prior level. 0 1 2 3 4 5 6 7 8 9  10 (Date and Score) Activity Initial  Activity Eval     Walking uphill   5    Carrying items   5    Sleep  4    Additional Additional Total score = sum of the activity scores/number of activities Minimum detectable change (90%CI) for average score = 2 points Minimum detectable change (90%CI) for single activity score = 3 points PSFS developed by: Rosalee MYRTIS Marvis KYM Charlet CHRISTELLA., & Binkley, J. (1995). Assessing disability and change on individual  patients: a report of a patient specific measure. Physiotherapy [place], 47, [phone]. Reproduced with the permission of the authors  Score: 4.6   COGNITION: Overall cognitive status: Within functional limits for tasks assessed     SENSATION: Not tested  EDEMA:  No obvious swelling about the knees   MUSCLE LENGTH: Hamstrings: Right lacking 40 deg; Left lacking 35 deg   POSTURE: rounded shoulders and forward head, tibial bowing     LOWER EXTREMITY ROM:  Active ROM Right eval Left eval  Hip flexion    Hip extension    Hip  abduction    Hip adduction    Hip internal rotation    Hip external rotation    Knee flexion 115 115  Knee extension WNL WNL  Ankle dorsiflexion    Ankle plantarflexion    Ankle inversion    Ankle eversion     (Blank rows = not tested)  LOWER EXTREMITY MMT:  MMT Right eval Left eval  Hip flexion 4+ 5  Hip extension 4- 4-  Hip abduction 4- 4-  Hip adduction    Hip internal rotation    Hip external rotation    Knee flexion 4+ 5  Knee extension 4+ 5  Ankle dorsiflexion    Ankle plantarflexion    Ankle inversion    Ankle eversion     (Blank rows = not tested) CERVICAL ROM:   Active ROM A/PROM (deg) eval  Flexion 40  Extension 20  Right lateral flexion   Left lateral flexion   Right rotation 45  Left rotation 35   (Blank rows = not tested) UPPER EXTREMITY MMT:  MMT Right eval Left eval  Shoulder flexion 5 upper trap compensation 5 upper trap compensation  Shoulder extension    Shoulder abduction 5 5  Shoulder adduction    Shoulder extension    Shoulder internal rotation 5 5  Shoulder external rotation 5 5  Middle trapezius 4- 4-  Lower trapezius    Elbow flexion    Elbow extension    Wrist flexion    Wrist extension    Wrist ulnar deviation    Wrist radial deviation    Wrist pronation    Wrist supination    Grip strength     (Blank rows = not tested)  SPECIAL TESTS: (+) Ely's bilaterally   FUNCTIONAL TESTS:  Not tested   GAIT: Distance walked: 20 ft  Assistive device utilized: None Level of assistance: Complete Independence Comments: decreased Rt arm swing                                                                                                                                [facility] Adult PT Treatment:                                                DATE: 07/18/24 Therapeutic Exercise: Demonstrated,performed, and issued initial HEP.       PATIENT EDUCATION:  Education details: see treatment; POC  Person educated: Patient Education  method: Explanation, Demonstration, Tactile cues, Verbal cues, and Handouts Education comprehension: verbalized understanding, returned demonstration, verbal cues required, tactile cues required, and needs further education  HOME EXERCISE PROGRAM: Access Code: [email or web address] URL: [email or web address] Date: 07/18/2024 Prepared by: Lucie Meeter  Exercises - Gastroc Stretch on Wall  - 1 x daily - 7 x weekly - 3 sets - 30 sec  hold - Seated Hamstring Stretch  - 1 x daily - 7 x weekly - 3 sets - 30 sec  hold - Modified Thomas Stretch  - 1 x daily - 7 x weekly - 3 sets - 30 sec  hold - Supine Cervical Retraction with Towel  - 1 x daily - 7 x weekly - 2 sets - 10 reps - Seated Scapular Retraction  - 1 x daily - 7 x weekly - 2 sets - 10 reps  ASSESSMENT:  CLINICAL IMPRESSION: Patient is a 58 y.o. male who was seen today for physical therapy evaluation and treatment for primary osteoarthritis of both knees and cervical spondylosis. Upon assessment in regards to his knees he is noted to have limited knee flexion AROM, hamstring/quad tightness, and hip/knee weakness. In regards to the neck he has limited cervical AROM, postural abnormalities, and periscapular weakness. He will benefit from skilled PT to address the above stated deficits in order to optimize his function and assist in overall pain reduction.   OBJECTIVE IMPAIRMENTS: Abnormal gait, decreased activity tolerance, decreased endurance, decreased knowledge of condition, difficulty walking, decreased ROM, decreased strength, impaired flexibility, impaired UE functional use,  improper body mechanics, postural dysfunction, and pain.   ACTIVITY LIMITATIONS: carrying, lifting, bending, standing, squatting, sleeping, stairs, transfers, reach over head, and locomotion level  PARTICIPATION LIMITATIONS: meal prep, cleaning, laundry, shopping, community activity, and yard work  PERSONAL FACTORS: Age, Fitness, Profession, Time since onset of  injury/illness/exacerbation, and 3+ comorbidities: see PMH above are also affecting patient's functional outcome.   REHAB POTENTIAL: Good  CLINICAL DECISION MAKING: Evolving/moderate complexity  EVALUATION COMPLEXITY: Moderate   GOALS: Goals reviewed with patient? Yes  SHORT TERM GOALS: Target date: 08/15/2024   Patient will be independent and compliant with initial HEP.   Baseline: initial HEP issued Goal status: INITIAL  2.  Patient will demonstrate at least 50 degrees of cervical rotation AROM to improve ability to scan environment when walking.  Baseline: see above Goal status: INITIAL  3.  Patient will demonstrate at least 120 degrees of bilateral knee flexion AROM to improve ease of transfers.  Baseline: see above  Goal status: INITIAL   LONG TERM GOALS: Target date: 09/16/24  Patient will improve average PSFS score by at least 2 points to signify clinically meaningful improvement in functional abilities.  Baseline: see above Goal status: INITIAL  2.  Patient will report at least a 50% improvement in neck/knee pain to reduce current functional limitations.  Baseline: 0% improvement Goal status: INITIAL  3.  Patient will demonstrate at least 4+/5 bilateral hip abductor/extensor strength to improve stability about the chain with walking activity.  Baseline: see above  Goal status: INITIAL  4.  Patient will demonstrate at least 4+/5 bilateral middle trap strength to improve postural stability.  Baseline: see above Goal status: INITIAL  5.  Patient will be independent with advanced home program to progress/maintain current level of function.  Baseline: initial HEP issued  Goal status: INITIAL    PLAN:  PT FREQUENCY: 2x/week  PT DURATION: 8 weeks  PLANNED INTERVENTIONS: 97164- PT Re-evaluation, 97750- Physical Performance Testing, 97110-Therapeutic exercises, 97530- Therapeutic activity, [id number]- Neuromuscular re-education, 97535- Self Care, [id number]- Manual  therapy, [id number]- Gait training, [phone] (1-2 muscles), 20561 (3+ muscles)- Dry Needling, Cryotherapy, and Moist heat  PLAN FOR NEXT SESSION: review and progress HEP prn; hip/knee stretching. Hip/knee strengthening, neck mobility, postural correctives   Lucie Meeter, PT, DPT, ATC 07/19/24 12:43 PM

## 2024-07-19 ENCOUNTER — Encounter: Payer: Self-pay | Admitting: Physical Therapy

## 2024-07-19 ENCOUNTER — Ambulatory Visit: Admitting: Physical Therapy

## 2024-07-19 DIAGNOSIS — M542 Cervicalgia: Secondary | ICD-10-CM

## 2024-07-19 DIAGNOSIS — G8929 Other chronic pain: Secondary | ICD-10-CM

## 2024-07-19 DIAGNOSIS — M25561 Pain in right knee: Secondary | ICD-10-CM | POA: Diagnosis not present

## 2024-07-19 DIAGNOSIS — M6281 Muscle weakness (generalized): Secondary | ICD-10-CM

## 2024-07-19 NOTE — Therapy (Signed)
 [facility] TREATMENT   Patient Name: Brandon Mckee MRN: [id number] DOB:1966/08/02, 58 y.o., male Today's Date: 07/19/2024  END OF SESSION:  PT End of Session - 07/19/24 1615     Visit Number 2    Number of Visits 17    Date for PT Re-Evaluation 09/16/24    Authorization Type [employer] MCD    Authorization - Visit Number 2    Authorization - Number of Visits 27    PT Start Time 1527    PT Stop Time 1613    PT Time Calculation (min) 46 min    Activity Tolerance Patient tolerated treatment well    Behavior During Therapy WFL for tasks assessed/performed           Past Medical History:  Diagnosis Date   Hyperlipidemia    Hypertension    Metabolic syndrome    IFG   Pes anserine bursitis    bilateral   Past Surgical History:  Procedure Laterality Date   APPENDECTOMY     Patient Active Problem List   Diagnosis Date Noted   Cervical spondylosis 07/17/2024   History of CVA (cerebrovascular accident) 12/06/2023   Thrombocytopenia (HCC) 12/06/2023   Hypokalemia 12/06/2023   Uncontrolled type 2 diabetes mellitus with hyperglycemia (HCC) 12/06/2023   Poorly-controlled hypertension 12/06/2023   Type 2 diabetes mellitus with hyperglycemia, without long-term current use of insulin (HCC) 12/06/2023   Hemiparesis of right dominant side due to acute cerebrovascular disease (HCC) 12/06/2023   Allergic reaction 07/16/2020   Gastroesophageal reflux disease with esophagitis 05/13/2018   Chronic gout of multiple sites 05/13/2018   Obesity (BMI 30-39.9) 09/10/2017   Grief reaction 08/09/2017   Benign prostatic hyperplasia without lower urinary tract symptoms 04/25/2017   Colon polyps 01/01/2017   Diverticulosis of colon without hemorrhage 01/01/2017   Trapezius muscle spasm 11/24/2016   Hypogonadism in male 07/03/2016   Microalbuminuria 04/01/2016   Osteoarthritis of right acromioclavicular joint 01/17/2016   Dyslipidemia 01/07/2016   Vitamin D  deficiency  01/07/2016   Depression 01/03/2016   Anxiety disorder 01/03/2016   Essential hypertension 01/03/2016   Pre-diabetes 12/07/2014   Male hypogonadism 05/25/2014   Obstructive sleep apnea 10/11/2013   B12 DEFICIENCY 06/30/2010   LEG EDEMA 05/03/2009   Primary osteoarthritis of both knees 12/27/2008   FATIGUE 09/25/2008   DYSMETABOLIC SYNDROME 08/04/2007   Hyperlipidemia LDL goal <70 12/08/2006   Class 2 severe obesity due to excess calories with serious comorbidity and body mass index (BMI) of 35.0 to 35.9 in adult (HCC) 12/08/2006    PCP: Antoniette Vermell CROME, PA-C  REFERRING PROVIDER: Curtis Debby PARAS, MD   REFERRING DIAG: M17.0 (ICD-10-CM) - Primary osteoarthritis of both knees; cervical spondylosis   THERAPY DIAG:  Chronic pain of both knees  Cervicalgia  Muscle weakness (generalized)  Rationale for Evaluation and Treatment: Rehabilitation  ONSET DATE: chronic   SUBJECTIVE:   SUBJECTIVE STATEMENT: Pt states he feels pretty good today. No pain after eval yesterday  PERTINENT HISTORY: CVA with Rt-sided hemiparesis  Obesity   Patient reports since 2008 he has been receiving knee injections for his pain. Then due to lack of insurance he went a few years without injections. Latest injection was in 2018. He feels that his knee pain is worsening and he can't even walk up hills. No popping/clicking in the knees. No instability. The pain is along the medial aspect of bilateral knees.   Patient reports off/on neck pain, but more recently the pain has been more constant  since CVA in December 2024. He reports the pain has been ongoing for at least a year. He attributes his pain to repetitive use of his upper body/previous work activity. He denies any numbness/tingling. No radicular symptoms.   PAIN:  Are you having pain? Yes: NPRS scale: 4 currently, at worst 8 (KNEES); 2 currently, at worst 7 (NECK) Pain location: bilateral medial knees and posterior neck Pain description:  throbbing (knees); tight (neck) Aggravating factors: walking up hills, carrying items(knees); neck movement, sleep (neck) Relieving factors: topical analgesic   PRECAUTIONS: None  RED FLAGS: None   WEIGHT BEARING RESTRICTIONS: No  FALLS:  Has patient fallen in last 6 months? No  LIVING ENVIRONMENT: Lives with: lives with their daughter Lives in: House/apartment Stairs: No Has following equipment at home: [name] - 2 wheeled, shower chair, and bed side commode  OCCUPATION: part time- works at [employer]  PLOF: Independent  PATIENT GOALS: relieve a little bit of the pain.   NEXT MD VISIT: nothing scheduled   OBJECTIVE:  Note: Objective measures were completed at Evaluation unless otherwise noted.  DIAGNOSTIC FINDINGS: X-rays pending   PATIENT SURVEYS:  Patient-specific activity scoring scheme (Point to one number):  0 represents "unable to perform." 10 represents "able to perform at prior level. 0 1 2 3 4 5 6 7 8 9  10 (Date and Score) Activity Initial  Activity Eval     Walking uphill   5    Carrying items   5    Sleep  4    Additional Additional Total score = sum of the activity scores/number of activities Minimum detectable change (90%CI) for average score = 2 points Minimum detectable change (90%CI) for single activity score = 3 points PSFS developed by: Rosalee MYRTIS Marvis KYM Charlet CHRISTELLA., & Binkley, J. (1995). Assessing disability and change on individual  patients: a report of a patient specific measure. Physiotherapy [place], 47, [phone]. Reproduced with the permission of the authors  Score: 4.6   COGNITION: Overall cognitive status: Within functional limits for tasks assessed     SENSATION: Not tested  EDEMA:  No obvious swelling about the knees   MUSCLE LENGTH: Hamstrings: Right lacking 40 deg; Left lacking 35 deg   POSTURE: rounded shoulders and forward head, tibial bowing     LOWER EXTREMITY ROM:  Active ROM  Right eval Left eval  Hip flexion    Hip extension    Hip abduction    Hip adduction    Hip internal rotation    Hip external rotation    Knee flexion 115 115  Knee extension WNL WNL  Ankle dorsiflexion    Ankle plantarflexion    Ankle inversion    Ankle eversion     (Blank rows = not tested)  LOWER EXTREMITY MMT:  MMT Right eval Left eval  Hip flexion 4+ 5  Hip extension 4- 4-  Hip abduction 4- 4-  Hip adduction    Hip internal rotation    Hip external rotation    Knee flexion 4+ 5  Knee extension 4+ 5  Ankle dorsiflexion    Ankle plantarflexion    Ankle inversion    Ankle eversion     (Blank rows = not tested) CERVICAL ROM:   Active ROM A/PROM (deg) eval  Flexion 40  Extension 20  Right lateral flexion   Left lateral flexion   Right rotation 45  Left rotation 35   (Blank rows = not tested) UPPER EXTREMITY MMT:  MMT Right  eval Left eval  Shoulder flexion 5 upper trap compensation 5 upper trap compensation  Shoulder extension    Shoulder abduction 5 5  Shoulder adduction    Shoulder extension    Shoulder internal rotation 5 5  Shoulder external rotation 5 5  Middle trapezius 4- 4-  Lower trapezius    Elbow flexion    Elbow extension    Wrist flexion    Wrist extension    Wrist ulnar deviation    Wrist radial deviation    Wrist pronation    Wrist supination    Grip strength     (Blank rows = not tested)  SPECIAL TESTS: (+) Ely's bilaterally   FUNCTIONAL TESTS:  Not tested   GAIT: Distance walked: 20 ft  Assistive device utilized: None Level of assistance: Complete Independence Comments: decreased Rt arm swing                                                                                                                                [facility] Adult PT Treatment:                                                DATE: 07/19/24 Therapeutic Exercise/Activity: Nustep L6 x 5 min for warm up Standing Gastroc stretch at wall 2 x 20 sec Row  green TB x 20 T red TB x 20 Lower trap lift off wall x 10 Hip abd green TB x 15 Hip ext green TB x 15 - cues for glute activation Seated HS stretch 2 x 20 sec Scap squeeze 10 x 3 sec hold Sit <> stand 2 x 10 Supine Bridge x 20 --> bridge with add squeeze x 10 Knee flex/ext with LEs on physioball Cervical retraction 10 x 3 sec hold Cervical rotation 5 x 10 sec hold   [facility] Adult PT Treatment:                                                DATE: 07/18/24 Therapeutic Exercise: Demonstrated,performed, and issued initial HEP.       PATIENT EDUCATION:  Education details: see treatment; POC  Person educated: Patient Education method: Explanation, Demonstration, Tactile cues, Verbal cues, and Handouts Education comprehension: verbalized understanding, returned demonstration, verbal cues required, tactile cues required, and needs further education  HOME EXERCISE PROGRAM: Access Code: [email or web address] URL: [email or web address] Date: 07/18/2024 Prepared by: Lucie Meeter  Exercises - Gastroc Stretch on Wall  - 1 x daily - 7 x weekly - 3 sets - 30 sec  hold - Seated Hamstring Stretch  - 1 x daily - 7 x weekly - 3 sets - 30 sec  hold - Modified Debby Dales  -  1 x daily - 7 x weekly - 3 sets - 30 sec  hold - Supine Cervical Retraction with Towel  - 1 x daily - 7 x weekly - 2 sets - 10 reps - Seated Scapular Retraction  - 1 x daily - 7 x weekly - 2 sets - 10 reps  ASSESSMENT:  CLINICAL IMPRESSION: Pt with good tolerance to progression of exercises. He requires cues to reduce compensations during postural exercises. Plan to progress HEP next visit  OBJECTIVE IMPAIRMENTS: Abnormal gait, decreased activity tolerance, decreased endurance, decreased knowledge of condition, difficulty walking, decreased ROM, decreased strength, impaired flexibility, impaired UE functional use, improper body mechanics, postural dysfunction, and pain.     GOALS: Goals reviewed with patient?  Yes  SHORT TERM GOALS: Target date: 08/15/2024   Patient will be independent and compliant with initial HEP.   Baseline: initial HEP issued Goal status: INITIAL  2.  Patient will demonstrate at least 50 degrees of cervical rotation AROM to improve ability to scan environment when walking.  Baseline: see above Goal status: INITIAL  3.  Patient will demonstrate at least 120 degrees of bilateral knee flexion AROM to improve ease of transfers.  Baseline: see above  Goal status: INITIAL   LONG TERM GOALS: Target date: 09/16/24  Patient will improve average PSFS score by at least 2 points to signify clinically meaningful improvement in functional abilities.  Baseline: see above Goal status: INITIAL  2.  Patient will report at least a 50% improvement in neck/knee pain to reduce current functional limitations.  Baseline: 0% improvement Goal status: INITIAL  3.  Patient will demonstrate at least 4+/5 bilateral hip abductor/extensor strength to improve stability about the chain with walking activity.  Baseline: see above  Goal status: INITIAL  4.  Patient will demonstrate at least 4+/5 bilateral middle trap strength to improve postural stability.  Baseline: see above Goal status: INITIAL  5.  Patient will be independent with advanced home program to progress/maintain current level of function.  Baseline: initial HEP issued  Goal status: INITIAL    PLAN:  PT FREQUENCY: 2x/week  PT DURATION: 8 weeks  PLANNED INTERVENTIONS: 97164- PT Re-evaluation, 97750- Physical Performance Testing, 97110-Therapeutic exercises, 97530- Therapeutic activity, [id number]- Neuromuscular re-education, 97535- Self Care, 02859- Manual therapy, [id number]- Gait training, [phone] (1-2 muscles), 20561 (3+ muscles)- Dry Needling, Cryotherapy, and Moist heat  PLAN FOR NEXT SESSION: review and progress HEP prn; hip/knee stretching. Hip/knee strengthening, neck mobility, postural correctives   Darice Conine,  PT,DPT07/30/254:15 PM

## 2024-07-22 ENCOUNTER — Ambulatory Visit: Payer: Self-pay | Admitting: Sports Medicine

## 2024-07-24 ENCOUNTER — Ambulatory Visit (INDEPENDENT_AMBULATORY_CARE_PROVIDER_SITE_OTHER)

## 2024-07-24 VITALS — BP 143/67 | HR 76 | Ht 70.0 in | Wt 246.0 lb

## 2024-07-24 DIAGNOSIS — I10 Essential (primary) hypertension: Secondary | ICD-10-CM | POA: Diagnosis not present

## 2024-07-24 NOTE — Progress Notes (Signed)
   Established Patient Office Visit  Subjective   Patient ID: CALHOUN REICHARDT, male    DOB: 1966/01/01  Age: 58 y.o. MRN: [id number]  Chief Complaint  Patient presents with   Hypertension    HPI  [place] SHAUNNA Bartelson is here for blood pressure check. He reports he did take his medications this morning. Denies chest pain, shortness of breath or dizziness.   ROS    Objective:     BP (!) 143/67   Pulse 76   Ht 5' 10 (1.778 m)   Wt 246 lb (111.6 kg)   SpO2 100%   BMI 35.30 kg/m  BP Readings from Last 3 Encounters:  07/24/24 (!) 143/67  07/10/24 (!) 141/76  04/24/24 139/80      Physical Exam   No results found for any visits on 07/24/24.    The ASCVD Risk score (Arnett DK, et al., 2019) failed to calculate for the following reasons:   Risk score cannot be calculated because patient has a medical history suggesting prior/existing ASCVD    Assessment & Plan:  Hypertension - Blood pressure check was 146/78 and the recheck was 143/67. Patient advised to continue current medications as directed, per Jade. Follow up in 3 months.   Problem List Items Addressed This Visit       Unprioritized   Essential hypertension - Primary    Return in about 3 months (around 10/24/2024) for HTN/DM with Jade. [facility] Pear, Jon Mayor, CMA

## 2024-07-26 ENCOUNTER — Other Ambulatory Visit: Payer: Self-pay | Admitting: Physician Assistant

## 2024-07-26 ENCOUNTER — Ambulatory Visit: Attending: Sports Medicine | Admitting: Physical Therapy

## 2024-07-26 ENCOUNTER — Encounter: Payer: Self-pay | Admitting: Physical Therapy

## 2024-07-26 DIAGNOSIS — M6281 Muscle weakness (generalized): Secondary | ICD-10-CM | POA: Insufficient documentation

## 2024-07-26 DIAGNOSIS — M25562 Pain in left knee: Secondary | ICD-10-CM | POA: Diagnosis present

## 2024-07-26 DIAGNOSIS — M542 Cervicalgia: Secondary | ICD-10-CM | POA: Diagnosis present

## 2024-07-26 DIAGNOSIS — E1165 Type 2 diabetes mellitus with hyperglycemia: Secondary | ICD-10-CM

## 2024-07-26 DIAGNOSIS — G8929 Other chronic pain: Secondary | ICD-10-CM | POA: Insufficient documentation

## 2024-07-26 DIAGNOSIS — M25561 Pain in right knee: Secondary | ICD-10-CM | POA: Diagnosis present

## 2024-07-26 NOTE — Therapy (Signed)
 [facility]   Patient Name: Brandon Mckee MRN: [id number] DOB:09-13-1966, 58 y.o., male Today's Date: 07/26/2024  END OF SESSION:  PT End of Session - 07/26/24 1615     Visit Number 3    Number of Visits 17    Date for PT Re-Evaluation 09/16/24    Authorization Type [employer] MCD    Authorization - Visit Number 3    Authorization - Number of Visits 27    PT Start Time 1527    PT Stop Time 1609    PT Time Calculation (min) 42 min    Activity Tolerance Patient tolerated treatment well    Behavior During Therapy WFL for tasks assessed/performed            Past Medical History:  Diagnosis Date   Hyperlipidemia    Hypertension    Metabolic syndrome    IFG   Pes anserine bursitis    bilateral   Past Surgical History:  Procedure Laterality Date   APPENDECTOMY     Patient Active Problem List   Diagnosis Date Noted   Cervical spondylosis 07/17/2024   History of CVA (cerebrovascular accident) 12/06/2023   Thrombocytopenia (HCC) 12/06/2023   Hypokalemia 12/06/2023   Uncontrolled type 2 diabetes mellitus with hyperglycemia (HCC) 12/06/2023   Poorly-controlled hypertension 12/06/2023   Type 2 diabetes mellitus with hyperglycemia, without long-term current use of insulin (HCC) 12/06/2023   Hemiparesis of right dominant side due to acute cerebrovascular disease (HCC) 12/06/2023   Allergic reaction 07/16/2020   Gastroesophageal reflux disease with esophagitis 05/13/2018   Chronic gout of multiple sites 05/13/2018   Obesity (BMI 30-39.9) 09/10/2017   Grief reaction 08/09/2017   Benign prostatic hyperplasia without lower urinary tract symptoms 04/25/2017   Colon polyps 01/01/2017   Diverticulosis of colon without hemorrhage 01/01/2017   Trapezius muscle spasm 11/24/2016   Hypogonadism in male 07/03/2016   Microalbuminuria 04/01/2016   Osteoarthritis of right acromioclavicular joint 01/17/2016   Dyslipidemia 01/07/2016   Vitamin D  deficiency  01/07/2016   Depression 01/03/2016   Anxiety disorder 01/03/2016   Essential hypertension 01/03/2016   Pre-diabetes 12/07/2014   Male hypogonadism 05/25/2014   Obstructive sleep apnea 10/11/2013   B12 DEFICIENCY 06/30/2010   LEG EDEMA 05/03/2009   Primary osteoarthritis of both knees 12/27/2008   FATIGUE 09/25/2008   DYSMETABOLIC SYNDROME 08/04/2007   Hyperlipidemia LDL goal <70 12/08/2006   Class 2 severe obesity due to excess calories with serious comorbidity and body mass index (BMI) of 35.0 to 35.9 in adult (HCC) 12/08/2006    PCP: Antoniette Vermell CROME, PA-C  REFERRING PROVIDER: Curtis Debby PARAS, MD   REFERRING DIAG: M17.0 (ICD-10-CM) - Primary osteoarthritis of both knees; cervical spondylosis   THERAPY DIAG:  Chronic pain of both knees  Cervicalgia  Muscle weakness (generalized)  Rationale for Evaluation and Treatment: Rehabilitation  ONSET DATE: chronic   SUBJECTIVE:   SUBJECTIVE STATEMENT: Pt states his shoulders are tight but no pain. He states his knees only hurt if he walks up a hill  PERTINENT HISTORY: CVA with Rt-sided hemiparesis  Obesity   Patient reports since 2008 he has been receiving knee injections for his pain. Then due to lack of insurance he went a few years without injections. Latest injection was in 2018. He feels that his knee pain is worsening and he can't even walk up hills. No popping/clicking in the knees. No instability. The pain is along the medial aspect of bilateral knees.   Patient reports off/on neck  pain, but more recently the pain has been more constant since CVA in December 2024. He reports the pain has been ongoing for at least a year. He attributes his pain to repetitive use of his upper body/previous work activity. He denies any numbness/tingling. No radicular symptoms.   PAIN:  Are you having pain? Yes: NPRS scale: 4 currently, at worst 8 (KNEES); 2 currently, at worst 7 (NECK) Pain location: bilateral medial knees and  posterior neck Pain description: throbbing (knees); tight (neck) Aggravating factors: walking up hills, carrying items(knees); neck movement, sleep (neck) Relieving factors: topical analgesic   PRECAUTIONS: None  RED FLAGS: None   WEIGHT BEARING RESTRICTIONS: No  FALLS:  Has patient fallen in last 6 months? No  LIVING ENVIRONMENT: Lives with: lives with their daughter Lives in: House/apartment Stairs: No Has following equipment at home: [name] - 2 wheeled, shower chair, and bed side commode  OCCUPATION: part time- works at [employer]  PLOF: Independent  PATIENT GOALS: relieve a little bit of the pain.   NEXT MD VISIT: nothing scheduled   OBJECTIVE:  Note: Objective measures were completed at Evaluation unless otherwise noted.  DIAGNOSTIC FINDINGS: X-rays pending   PATIENT SURVEYS:  Patient-specific activity scoring scheme (Point to one number):  0 represents "unable to perform." 10 represents "able to perform at prior level. 0 1 2 3 4 5 6 7 8 9  10 (Date and Score) Activity Initial  Activity Eval     Walking uphill   5    Carrying items   5    Sleep  4    Additional Additional Total score = sum of the activity scores/number of activities Minimum detectable change (90%CI) for average score = 2 points Minimum detectable change (90%CI) for single activity score = 3 points PSFS developed by: Rosalee MYRTIS Marvis KYM Charlet CHRISTELLA., & Binkley, J. (1995). Assessing disability and change on individual  patients: a report of a patient specific measure. Physiotherapy [place], 47, [phone]. Reproduced with the permission of the authors  Score: 4.6   COGNITION: Overall cognitive status: Within functional limits for tasks assessed     SENSATION: Not tested  EDEMA:  No obvious swelling about the knees   MUSCLE LENGTH: Hamstrings: Right lacking 40 deg; Left lacking 35 deg   POSTURE: rounded shoulders and forward head, tibial bowing      LOWER EXTREMITY ROM:  Active ROM Right eval Left eval  Hip flexion    Hip extension    Hip abduction    Hip adduction    Hip internal rotation    Hip external rotation    Knee flexion 115 115  Knee extension WNL WNL  Ankle dorsiflexion    Ankle plantarflexion    Ankle inversion    Ankle eversion     (Blank rows = not tested)  LOWER EXTREMITY MMT:  MMT Right eval Left eval  Hip flexion 4+ 5  Hip extension 4- 4-  Hip abduction 4- 4-  Hip adduction    Hip internal rotation    Hip external rotation    Knee flexion 4+ 5  Knee extension 4+ 5  Ankle dorsiflexion    Ankle plantarflexion    Ankle inversion    Ankle eversion     (Blank rows = not tested) CERVICAL ROM:   Active ROM A/PROM (deg) eval  Flexion 40  Extension 20  Right lateral flexion   Left lateral flexion   Right rotation 45  Left rotation 35   (Blank  rows = not tested) UPPER EXTREMITY MMT:  MMT Right eval Left eval  Shoulder flexion 5 upper trap compensation 5 upper trap compensation  Shoulder extension    Shoulder abduction 5 5  Shoulder adduction    Shoulder extension    Shoulder internal rotation 5 5  Shoulder external rotation 5 5  Middle trapezius 4- 4-  Lower trapezius    Elbow flexion    Elbow extension    Wrist flexion    Wrist extension    Wrist ulnar deviation    Wrist radial deviation    Wrist pronation    Wrist supination    Grip strength     (Blank rows = not tested)  SPECIAL TESTS: (+) Ely's bilaterally   FUNCTIONAL TESTS:  Not tested   GAIT: Distance walked: 20 ft  Assistive device utilized: None Level of assistance: Complete Independence Comments: decreased Rt arm swing                                                                                                                                [facility] Adult PT Treatment:                                                DATE: 07/26/24 Therapeutic Exercise/Activity: Nustep L6 x 5 min for warm  up Standing Gastroc stretch 2 x 20 sec Heel raise x 20 Squat at counter x 10 Split squat with 1 UE support x 10 Runners step up 6'' step x 10 bilat Row 15# x 15 Shoulder ext 15# x 15  Wall push up x 12 Treadmill 1.3 mph incline 2-->3 - some Lt knee pain at end of 3 minutes Seated UT stretch 2 x 20 bilat Cervical rotation 3 x 20 sec bilat Cervical retraction x 10 HS stretch 2 x 30 sec bilat   [facility] Adult PT Treatment:                                                DATE: 07/19/24 Therapeutic Exercise/Activity: Nustep L6 x 5 min for warm up Standing Gastroc stretch at wall 2 x 20 sec Row green TB x 20 T red TB x 20 Lower trap lift off wall x 10 Hip abd green TB x 15 Hip ext green TB x 15 - cues for glute activation Seated HS stretch 2 x 20 sec Scap squeeze 10 x 3 sec hold Sit <> stand 2 x 10 Supine Bridge x 20 --> bridge with add squeeze x 10 Knee flex/ext with LEs on physioball Cervical retraction 10 x 3 sec hold Cervical rotation 5 x 10 sec hold    PATIENT EDUCATION:  Education details:  see treatment; POC  Person educated: Patient Education method: Explanation, Demonstration, Tactile cues, Verbal cues, and Handouts Education comprehension: verbalized understanding, returned demonstration, verbal cues required, tactile cues required, and needs further education  HOME EXERCISE PROGRAM: Access Code: [email or web address] URL: [email or web address] Date: 07/26/2024 Prepared by: Darice Conine  Exercises - Gastroc Stretch on Wall  - 1 x daily - 7 x weekly - 3 sets - 30 sec  hold - Seated Hamstring Stretch  - 1 x daily - 7 x weekly - 3 sets - 30 sec  hold - Seated Cervical Rotation AROM  - 1 x daily - 7 x weekly - 1 sets - 10 reps - 10 seconds hold - Supine Cervical Retraction with Towel  - 1 x daily - 7 x weekly - 2 sets - 10 reps - Squat with Counter Support  - 1 x daily - 7 x weekly - 2 sets - 10 reps - Standing Heel Raise  - 1 x daily - 7 x weekly - 2 sets -  10 reps - Wall Push Up  - 1 x daily - 7 x weekly - 2 sets - 10 reps  ASSESSMENT:  CLINICAL IMPRESSION: Continued to progress strength with focus on walking uphill and shoulder/cervical mobility this session. Pt with slight Lt knee pain after 3 minutes of walking uphill - eased with seated rest. Noted Lt LE fatigues more quickly with heel raises and gait. Pt requires cues for technique with postural stretching. HEP updated to progress strength   OBJECTIVE IMPAIRMENTS: Abnormal gait, decreased activity tolerance, decreased endurance, decreased knowledge of condition, difficulty walking, decreased ROM, decreased strength, impaired flexibility, impaired UE functional use, improper body mechanics, postural dysfunction, and pain.     GOALS: Goals reviewed with patient? Yes  SHORT TERM GOALS: Target date: 08/15/2024   Patient will be independent and compliant with initial HEP.   Baseline: initial HEP issued Goal status: INITIAL  2.  Patient will demonstrate at least 50 degrees of cervical rotation AROM to improve ability to scan environment when walking.  Baseline: see above Goal status: INITIAL  3.  Patient will demonstrate at least 120 degrees of bilateral knee flexion AROM to improve ease of transfers.  Baseline: see above  Goal status: INITIAL   LONG TERM GOALS: Target date: 09/16/24  Patient will improve average PSFS score by at least 2 points to signify clinically meaningful improvement in functional abilities.  Baseline: see above Goal status: INITIAL  2.  Patient will report at least a 50% improvement in neck/knee pain to reduce current functional limitations.  Baseline: 0% improvement Goal status: INITIAL  3.  Patient will demonstrate at least 4+/5 bilateral hip abductor/extensor strength to improve stability about the chain with walking activity.  Baseline: see above  Goal status: INITIAL  4.  Patient will demonstrate at least 4+/5 bilateral middle trap strength to  improve postural stability.  Baseline: see above Goal status: INITIAL  5.  Patient will be independent with advanced home program to progress/maintain current level of function.  Baseline: initial HEP issued  Goal status: INITIAL    PLAN:  PT FREQUENCY: 2x/week  PT DURATION: 8 weeks  PLANNED INTERVENTIONS: 97164- PT Re-evaluation, 97750- Physical Performance Testing, 97110-Therapeutic exercises, 97530- Therapeutic activity, [id number]- Neuromuscular re-education, 97535- Self Care, [id number]- Manual therapy, [id number]- Gait training, [phone] (1-2 muscles), [id number] (3+ muscles)- Dry Needling, Cryotherapy, and Moist heat  PLAN FOR NEXT SESSION: functional strength (step ups, squats, carry items)  hip/knee stretching. Hip/knee strengthening, neck mobility, postural correctives  Darice Conine, PT,DPT08/06/254:16 PM

## 2024-07-31 ENCOUNTER — Ambulatory Visit

## 2024-07-31 DIAGNOSIS — M542 Cervicalgia: Secondary | ICD-10-CM

## 2024-07-31 DIAGNOSIS — G8929 Other chronic pain: Secondary | ICD-10-CM

## 2024-07-31 DIAGNOSIS — M25561 Pain in right knee: Secondary | ICD-10-CM | POA: Diagnosis not present

## 2024-07-31 DIAGNOSIS — M6281 Muscle weakness (generalized): Secondary | ICD-10-CM

## 2024-07-31 NOTE — Therapy (Signed)
 [facility]   Patient Name: Brandon Mckee MRN: [id number] DOB:06-21-1966, 58 y.o., male Today's Date: 07/31/2024  END OF SESSION:  PT End of Session - 07/31/24 1534     Visit Number 4    Number of Visits 17    Date for PT Re-Evaluation 09/16/24    Authorization Type [employer] MCD    Authorization - Visit Number 4    Authorization - Number of Visits 27    PT Start Time 1534    PT Stop Time 1619    PT Time Calculation (min) 45 min    Activity Tolerance Patient tolerated treatment well    Behavior During Therapy WFL for tasks assessed/performed         Past Medical History:  Diagnosis Date   Hyperlipidemia    Hypertension    Metabolic syndrome    IFG   Pes anserine bursitis    bilateral   Past Surgical History:  Procedure Laterality Date   APPENDECTOMY     Patient Active Problem List   Diagnosis Date Noted   Cervical spondylosis 07/17/2024   History of CVA (cerebrovascular accident) 12/06/2023   Thrombocytopenia (HCC) 12/06/2023   Hypokalemia 12/06/2023   Uncontrolled type 2 diabetes mellitus with hyperglycemia (HCC) 12/06/2023   Poorly-controlled hypertension 12/06/2023   Type 2 diabetes mellitus with hyperglycemia, without long-term current use of insulin (HCC) 12/06/2023   Hemiparesis of right dominant side due to acute cerebrovascular disease (HCC) 12/06/2023   Allergic reaction 07/16/2020   Gastroesophageal reflux disease with esophagitis 05/13/2018   Chronic gout of multiple sites 05/13/2018   Obesity (BMI 30-39.9) 09/10/2017   Grief reaction 08/09/2017   Benign prostatic hyperplasia without lower urinary tract symptoms 04/25/2017   Colon polyps 01/01/2017   Diverticulosis of colon without hemorrhage 01/01/2017   Trapezius muscle spasm 11/24/2016   Hypogonadism in male 07/03/2016   Microalbuminuria 04/01/2016   Osteoarthritis of right acromioclavicular joint 01/17/2016   Dyslipidemia 01/07/2016   Vitamin D  deficiency  01/07/2016   Depression 01/03/2016   Anxiety disorder 01/03/2016   Essential hypertension 01/03/2016   Pre-diabetes 12/07/2014   Male hypogonadism 05/25/2014   Obstructive sleep apnea 10/11/2013   B12 DEFICIENCY 06/30/2010   LEG EDEMA 05/03/2009   Primary osteoarthritis of both knees 12/27/2008   FATIGUE 09/25/2008   DYSMETABOLIC SYNDROME 08/04/2007   Hyperlipidemia LDL goal <70 12/08/2006   Class 2 severe obesity due to excess calories with serious comorbidity and body mass index (BMI) of 35.0 to 35.9 in adult (HCC) 12/08/2006    PCP: Antoniette Vermell CROME, PA-C  REFERRING PROVIDER: Curtis Debby PARAS, MD   REFERRING DIAG: M17.0 (ICD-10-CM) - Primary osteoarthritis of both knees; cervical spondylosis   THERAPY DIAG:  Chronic pain of both knees  Cervicalgia  Muscle weakness (generalized)  Rationale for Evaluation and Treatment: Rehabilitation  ONSET DATE: chronic   SUBJECTIVE:   SUBJECTIVE STATEMENT: Patient reports his shoulders are feeling fine but his Rt calf is sore, states when he wakes up in the morning and stretches he can get a cramp in the Rt leg.   PERTINENT HISTORY: CVA with Rt-sided hemiparesis  Obesity   Patient reports since 2008 he has been receiving knee injections for his pain. Then due to lack of insurance he went a few years without injections. Latest injection was in 2018. He feels that his knee pain is worsening and he can't even walk up hills. No popping/clicking in the knees. No instability. The pain is along the medial aspect  of bilateral knees.   Patient reports off/on neck pain, but more recently the pain has been more constant since CVA in December 2024. He reports the pain has been ongoing for at least a year. He attributes his pain to repetitive use of his upper body/previous work activity. He denies any numbness/tingling. No radicular symptoms.   PAIN:  Are you having pain? Yes: NPRS scale: 4 currently, at worst 8 (KNEES); 2 currently, at  worst 7 (NECK) Pain location: bilateral medial knees and posterior neck Pain description: throbbing (knees); tight (neck) Aggravating factors: walking up hills, carrying items(knees); neck movement, sleep (neck) Relieving factors: topical analgesic   PRECAUTIONS: None  RED FLAGS: None   WEIGHT BEARING RESTRICTIONS: No  FALLS:  Has patient fallen in last 6 months? No  LIVING ENVIRONMENT: Lives with: lives with their daughter Lives in: House/apartment Stairs: No Has following equipment at home: [name] - 2 wheeled, shower chair, and bed side commode  OCCUPATION: part time- works at [employer]  PLOF: Independent  PATIENT GOALS: relieve a little bit of the pain.   NEXT MD VISIT: nothing scheduled   OBJECTIVE:  Note: Objective measures were completed at Evaluation unless otherwise noted.  DIAGNOSTIC FINDINGS: X-rays pending   PATIENT SURVEYS:  Patient-specific activity scoring scheme (Point to one number):  0 represents "unable to perform." 10 represents "able to perform at prior level. 0 1 2 3 4 5 6 7 8 9  10 (Date and Score) Activity Initial  Activity Eval     Walking uphill   5    Carrying items   5    Sleep  4    Additional Additional Total score = sum of the activity scores/number of activities Minimum detectable change (90%CI) for average score = 2 points Minimum detectable change (90%CI) for single activity score = 3 points PSFS developed by: Rosalee MYRTIS Marvis KYM Charlet CHRISTELLA., & Binkley, J. (1995). Assessing disability and change on individual  patients: a report of a patient specific measure. Physiotherapy [place], 47, [phone]. Reproduced with the permission of the authors  Score: 4.6   COGNITION: Overall cognitive status: Within functional limits for tasks assessed     SENSATION: Not tested  EDEMA:  No obvious swelling about the knees   MUSCLE LENGTH: Hamstrings: Right lacking 40 deg; Left lacking 35 deg   POSTURE:  rounded shoulders and forward head, tibial bowing     LOWER EXTREMITY ROM:  Active ROM Right eval Left eval Right 07/31/24 Left 07/31/24  Hip flexion      Hip extension      Hip abduction      Hip adduction      Hip internal rotation      Hip external rotation      Knee flexion 115 115 123 120  Knee extension WNL WNL    Ankle dorsiflexion      Ankle plantarflexion      Ankle inversion      Ankle eversion       (Blank rows = not tested)  LOWER EXTREMITY MMT:  MMT Right eval Left eval  Hip flexion 4+ 5  Hip extension 4- 4-  Hip abduction 4- 4-  Hip adduction    Hip internal rotation    Hip external rotation    Knee flexion 4+ 5  Knee extension 4+ 5  Ankle dorsiflexion    Ankle plantarflexion    Ankle inversion    Ankle eversion     (Blank rows = not  tested) CERVICAL ROM:   Active ROM A/PROM (deg) eval  Flexion 40  Extension 20  Right lateral flexion   Left lateral flexion   Right rotation 45  Left rotation 35   (Blank rows = not tested) UPPER EXTREMITY MMT:  MMT Right eval Left eval  Shoulder flexion 5 upper trap compensation 5 upper trap compensation  Shoulder extension    Shoulder abduction 5 5  Shoulder adduction    Shoulder extension    Shoulder internal rotation 5 5  Shoulder external rotation 5 5  Middle trapezius 4- 4-  Lower trapezius    Elbow flexion    Elbow extension    Wrist flexion    Wrist extension    Wrist ulnar deviation    Wrist radial deviation    Wrist pronation    Wrist supination    Grip strength     (Blank rows = not tested)  SPECIAL TESTS: (+) Ely's bilaterally   FUNCTIONAL TESTS:  Not tested   GAIT: Distance walked: 20 ft  Assistive device utilized: None Level of assistance: Complete Independence Comments: decreased Rt arm swing   [facility] Adult PT Treatment:                                                DATE: 07/31/2024 Therapeutic Exercise: NuStep L6 x 6 min Runner's lunge gastroc stretch 2x30  (bil) Supine HS & ITB stretches with strap 2x30 each Neuromuscular re-ed: Wall squats 2x10  Bridge progression: regular stance --> staggered stance --> figure 4 2x10 Side Lying: Hip abd + blue TB around thighs x10 Kicks front/back + blue TB around thighs Leg pulses in extension + Blue TB around thighs  Small circles in abd  Therapeutic Activity: Eccentric heel raises 2x10 Walking + 40#KB suitcase carry x4 L + abdominal bracing Squats + cradling 20#KB 2x10                                                                                                                                [facility] Adult PT Treatment:                                                DATE: 07/26/24 Therapeutic Exercise/Activity: Nustep L6 x 5 min for warm up Standing Gastroc stretch 2 x 20 sec Heel raise x 20 Squat at counter x 10 Split squat with 1 UE support x 10 Runners step up 6'' step x 10 bilat Row 15# x 15 Shoulder ext 15# x 15  Wall push up x 12 Treadmill 1.3 mph incline 2-->3 - some Lt knee pain at end of 3 minutes Seated UT stretch 2 x 20 bilat Cervical  rotation 3 x 20 sec bilat Cervical retraction x 10 HS stretch 2 x 30 sec bilat   [facility] Adult PT Treatment:                                                DATE: 07/19/24 Therapeutic Exercise/Activity: Nustep L6 x 5 min for warm up Standing Gastroc stretch at wall 2 x 20 sec Row green TB x 20 T red TB x 20 Lower trap lift off wall x 10 Hip abd green TB x 15 Hip ext green TB x 15 - cues for glute activation Seated HS stretch 2 x 20 sec Scap squeeze 10 x 3 sec hold Sit <> stand 2 x 10 Supine Bridge x 20 --> bridge with add squeeze x 10 Knee flex/ext with LEs on physioball Cervical retraction 10 x 3 sec hold Cervical rotation 5 x 10 sec hold    PATIENT EDUCATION:  Education details: see treatment; POC  Person educated: Patient Education method: Explanation, Demonstration, Tactile cues, Verbal cues, and Handouts Education  comprehension: verbalized understanding, returned demonstration, verbal cues required, tactile cues required, and needs further education  HOME EXERCISE PROGRAM: Access Code: [email or web address] URL: [email or web address] Date: 07/26/2024 Prepared by: Darice Conine  Exercises - Gastroc Stretch on Wall  - 1 x daily - 7 x weekly - 3 sets - 30 sec  hold - Seated Hamstring Stretch  - 1 x daily - 7 x weekly - 3 sets - 30 sec  hold - Seated Cervical Rotation AROM  - 1 x daily - 7 x weekly - 1 sets - 10 reps - 10 seconds hold - Supine Cervical Retraction with Towel  - 1 x daily - 7 x weekly - 2 sets - 10 reps - Squat with Counter Support  - 1 x daily - 7 x weekly - 2 sets - 10 reps - Standing Heel Raise  - 1 x daily - 7 x weekly - 2 sets - 10 reps - Wall Push Up  - 1 x daily - 7 x weekly - 2 sets - 10 reps  ASSESSMENT:  CLINICAL IMPRESSION: Poor glute activation noted during side lying hip abduction/extension variations. Calf strengthening progressed with eccentric heel raises. Cueing with incorporating abdominal bracing when walking with unilateral/bilateral heavy weights (i.e. buckets of water at work). Patient has met STG #3 with knee flexion AROM (120 degrees bilateral).    OBJECTIVE IMPAIRMENTS: Abnormal gait, decreased activity tolerance, decreased endurance, decreased knowledge of condition, difficulty walking, decreased ROM, decreased strength, impaired flexibility, impaired UE functional use, improper body mechanics, postural dysfunction, and pain.     GOALS: Goals reviewed with patient? Yes  SHORT TERM GOALS: Target date: 08/15/2024  Patient will be independent and compliant with initial HEP.  Baseline: initial HEP issued Goal status: INITIAL  2.  Patient will demonstrate at least 50 degrees of cervical rotation AROM to improve ability to scan environment when walking.  Baseline: see above Goal status: INITIAL  3.  Patient will demonstrate at least 120 degrees of  bilateral knee flexion AROM to improve ease of transfers.  Baseline: see above  07/31/24: 120 (bil) Goal status: MET   LONG TERM GOALS: Target date: 09/16/24  Patient will improve average PSFS score by at least 2 points to signify clinically meaningful improvement in functional abilities.  Baseline: see above Goal status: INITIAL  2.  Patient will report at least a 50% improvement in neck/knee pain to reduce current functional limitations.  Baseline: 0% improvement Goal status: INITIAL  3.  Patient will demonstrate at least 4+/5 bilateral hip abductor/extensor strength to improve stability about the chain with walking activity.  Baseline: see above  Goal status: INITIAL  4.  Patient will demonstrate at least 4+/5 bilateral middle trap strength to improve postural stability.  Baseline: see above Goal status: INITIAL  5.  Patient will be independent with advanced home program to progress/maintain current level of function.  Baseline: initial HEP issued  Goal status: INITIAL    PLAN:  PT FREQUENCY: 2x/week  PT DURATION: 8 weeks  PLANNED INTERVENTIONS: 97164- PT Re-evaluation, 97750- Physical Performance Testing, 97110-Therapeutic exercises, 97530- Therapeutic activity, [id number]- Neuromuscular re-education, 97535- Self Care, [id number]- Manual therapy, [id number]- Gait training, [phone] (1-2 muscles), [id number] (3+ muscles)- Dry Needling, Cryotherapy, and Moist heat  PLAN FOR NEXT SESSION: functional strength (step ups, squats, carry items)  Hip/knee strengthening & stretching, neck mobility, postural correctives   Lamarr Price, PTA 07/31/24, 4:20 PM

## 2024-08-01 ENCOUNTER — Ambulatory Visit: Admitting: Physical Therapy

## 2024-08-01 ENCOUNTER — Encounter: Payer: Self-pay | Admitting: Physical Therapy

## 2024-08-01 DIAGNOSIS — G8929 Other chronic pain: Secondary | ICD-10-CM

## 2024-08-01 DIAGNOSIS — M6281 Muscle weakness (generalized): Secondary | ICD-10-CM

## 2024-08-01 DIAGNOSIS — M542 Cervicalgia: Secondary | ICD-10-CM

## 2024-08-01 DIAGNOSIS — M25561 Pain in right knee: Secondary | ICD-10-CM | POA: Diagnosis not present

## 2024-08-01 NOTE — Therapy (Signed)
 [facility]   Patient Name: Brandon Mckee MRN: [id number] DOB:12-11-1966, 58 y.o., male Today's Date: 08/01/2024  END OF SESSION:  PT End of Session - 08/01/24 1605     Visit Number 5    Number of Visits 17    Date for PT Re-Evaluation 09/16/24    Authorization Type [employer] MCD    Authorization - Visit Number 5    Authorization - Number of Visits 27    PT Start Time 1524    PT Stop Time 1606    PT Time Calculation (min) 42 min    Activity Tolerance Patient tolerated treatment well    Behavior During Therapy WFL for tasks assessed/performed          Past Medical History:  Diagnosis Date   Hyperlipidemia    Hypertension    Metabolic syndrome    IFG   Pes anserine bursitis    bilateral   Past Surgical History:  Procedure Laterality Date   APPENDECTOMY     Patient Active Problem List   Diagnosis Date Noted   Cervical spondylosis 07/17/2024   History of CVA (cerebrovascular accident) 12/06/2023   Thrombocytopenia (HCC) 12/06/2023   Hypokalemia 12/06/2023   Uncontrolled type 2 diabetes mellitus with hyperglycemia (HCC) 12/06/2023   Poorly-controlled hypertension 12/06/2023   Type 2 diabetes mellitus with hyperglycemia, without long-term current use of insulin (HCC) 12/06/2023   Hemiparesis of right dominant side due to acute cerebrovascular disease (HCC) 12/06/2023   Allergic reaction 07/16/2020   Gastroesophageal reflux disease with esophagitis 05/13/2018   Chronic gout of multiple sites 05/13/2018   Obesity (BMI 30-39.9) 09/10/2017   Grief reaction 08/09/2017   Benign prostatic hyperplasia without lower urinary tract symptoms 04/25/2017   Colon polyps 01/01/2017   Diverticulosis of colon without hemorrhage 01/01/2017   Trapezius muscle spasm 11/24/2016   Hypogonadism in male 07/03/2016   Microalbuminuria 04/01/2016   Osteoarthritis of right acromioclavicular joint 01/17/2016   Dyslipidemia 01/07/2016   Vitamin D  deficiency  01/07/2016   Depression 01/03/2016   Anxiety disorder 01/03/2016   Essential hypertension 01/03/2016   Pre-diabetes 12/07/2014   Male hypogonadism 05/25/2014   Obstructive sleep apnea 10/11/2013   B12 DEFICIENCY 06/30/2010   LEG EDEMA 05/03/2009   Primary osteoarthritis of both knees 12/27/2008   FATIGUE 09/25/2008   DYSMETABOLIC SYNDROME 08/04/2007   Hyperlipidemia LDL goal <70 12/08/2006   Class 2 severe obesity due to excess calories with serious comorbidity and body mass index (BMI) of 35.0 to 35.9 in adult (HCC) 12/08/2006    PCP: Brandon Vermell CROME, PA-C  REFERRING PROVIDER: Curtis Debby PARAS, MD   REFERRING DIAG: M17.0 (ICD-10-CM) - Primary osteoarthritis of both knees; cervical spondylosis   THERAPY DIAG:  Chronic pain of both knees  Cervicalgia  Muscle weakness (generalized)  Rationale for Evaluation and Treatment: Rehabilitation  ONSET DATE: chronic   SUBJECTIVE:   SUBJECTIVE STATEMENT: Pt states his knees are still bothering him. He states his neck is feeling better and he is able to sleep better at night. Pt states he was able to walk up and down hills a lot better than I used to  PERTINENT HISTORY: CVA with Rt-sided hemiparesis  Obesity   Patient reports since 2008 he has been receiving knee injections for his pain. Then due to lack of insurance he went a few years without injections. Latest injection was in 2018. He feels that his knee pain is worsening and he can't even walk up hills. No popping/clicking in the  knees. No instability. The pain is along the medial aspect of bilateral knees.   Patient reports off/on neck pain, but more recently the pain has been more constant since CVA in December 2024. He reports the pain has been ongoing for at least a year. He attributes his pain to repetitive use of his upper body/previous work activity. He denies any numbness/tingling. No radicular symptoms.   PAIN:  Are you having pain? Yes: NPRS scale: 4  currently, at worst 8 (KNEES); 0 currently, at worst 7 (NECK) Pain location: bilateral medial knees and posterior neck Pain description: throbbing (knees); tight (neck) Aggravating factors: walking up hills, carrying items(knees); neck movement, sleep (neck) Relieving factors: topical analgesic   PRECAUTIONS: None  RED FLAGS: None   WEIGHT BEARING RESTRICTIONS: No  FALLS:  Has patient fallen in last 6 months? No  LIVING ENVIRONMENT: Lives with: lives with their daughter Lives in: House/apartment Stairs: No Has following equipment at home: [name] - 2 wheeled, shower chair, and bed side commode  OCCUPATION: part time- works at [employer]  PLOF: Independent  PATIENT GOALS: relieve a little bit of the pain.   NEXT MD VISIT: nothing scheduled   OBJECTIVE:  Note: Objective measures were completed at Evaluation unless otherwise noted.  DIAGNOSTIC FINDINGS: X-rays pending   PATIENT SURVEYS:  Patient-specific activity scoring scheme (Point to one number):  0 represents "unable to perform." 10 represents "able to perform at prior level. 0 1 2 3 4 5 6 7 8 9  10 (Date and Score) Activity Initial  Activity Eval     Walking uphill   5    Carrying items   5    Sleep  4    Additional Additional Total score = sum of the activity scores/number of activities Minimum detectable change (90%CI) for average score = 2 points Minimum detectable change (90%CI) for single activity score = 3 points PSFS developed by: Brandon Mckee Brandon Mckee Brandon CHRISTELLA., & Binkley, J. (1995). Assessing disability and change on individual  patients: a report of a patient specific measure. Physiotherapy [place], 47, [phone]. Reproduced with the permission of the authors  Score: 4.6   COGNITION: Overall cognitive status: Within functional limits for tasks assessed     SENSATION: Not tested  EDEMA:  No obvious swelling about the knees   MUSCLE LENGTH: Hamstrings: Right  lacking 40 deg; Left lacking 35 deg   POSTURE: rounded shoulders and forward head, tibial bowing     LOWER EXTREMITY ROM:  Active ROM Right eval Left eval Right 07/31/24 Left 07/31/24  Hip flexion      Hip extension      Hip abduction      Hip adduction      Hip internal rotation      Hip external rotation      Knee flexion 115 115 123 120  Knee extension WNL WNL    Ankle dorsiflexion      Ankle plantarflexion      Ankle inversion      Ankle eversion       (Blank rows = not tested)  LOWER EXTREMITY MMT:  MMT Right eval Left eval  Hip flexion 4+ 5  Hip extension 4- 4-  Hip abduction 4- 4-  Hip adduction    Hip internal rotation    Hip external rotation    Knee flexion 4+ 5  Knee extension 4+ 5  Ankle dorsiflexion    Ankle plantarflexion    Ankle inversion  Ankle eversion     (Blank rows = not tested) CERVICAL ROM:   Active ROM A/PROM (deg) eval  Flexion 40  Extension 20  Right lateral flexion   Left lateral flexion   Right rotation 45  Left rotation 35   (Blank rows = not tested) UPPER EXTREMITY MMT:  MMT Right eval Left eval  Shoulder flexion 5 upper trap compensation 5 upper trap compensation  Shoulder extension    Shoulder abduction 5 5  Shoulder adduction    Shoulder extension    Shoulder internal rotation 5 5  Shoulder external rotation 5 5  Middle trapezius 4- 4-  Lower trapezius    Elbow flexion    Elbow extension    Wrist flexion    Wrist extension    Wrist ulnar deviation    Wrist radial deviation    Wrist pronation    Wrist supination    Grip strength     (Blank rows = not tested)  SPECIAL TESTS: (+) Ely's bilaterally   FUNCTIONAL TESTS:  Not tested   GAIT: Distance walked: 20 ft  Assistive device utilized: None Level of assistance: Complete Independence Comments: decreased Rt arm swing   [facility] Adult PT Treatment:                                                DATE: 08/01/24 Therapeutic Exercise: Nustep L6 x 5  min Gastroc stretch 2 x 30 sec bilat  Neuromuscular re-ed: Bridge x 10 Figure 4 bridge x 10 Sidelying Hip abd x 20 - cues for technique to reduce compensation Hip circles CW/CCW Therapeutic Activity: Runners step up 6'' step 1 UE support Wall squat 2 x 10 8'' Step ups with suitcase carry 15# KB x 10 bilat Suitcase carry 40#KB with improved posture noted Lift 15#KB from floor - focus on squat technique - x 10   [facility] Adult PT Treatment:                                                DATE: 07/31/2024 Therapeutic Exercise: NuStep L6 x 6 min Runner's lunge gastroc stretch 2x30 (bil) Supine HS & ITB stretches with strap 2x30 each Neuromuscular re-ed: Wall squats 2x10  Bridge progression: regular stance --> staggered stance --> figure 4 2x10 Side Lying: Hip abd + blue TB around thighs x10 Kicks front/back + blue TB around thighs Leg pulses in extension + Blue TB around thighs  Small circles in abd  Therapeutic Activity: Eccentric heel raises 2x10 Walking + 40#KB suitcase carry x4 L + abdominal bracing Squats + cradling 20#KB 2x10  [facility] Adult PT Treatment:                                                DATE: 07/26/24 Therapeutic Exercise/Activity: Nustep L6 x 5 min for warm up Standing Gastroc stretch 2 x 20 sec Heel raise x 20 Squat at counter x 10 Split squat with 1 UE support x 10 Runners step up 6'' step x 10 bilat Row 15# x 15 Shoulder ext 15# x 15  Wall push up x 12 Treadmill 1.3 mph incline 2-->3 - some Lt knee pain at end of 3 minutes Seated UT stretch 2 x 20 bilat Cervical rotation 3 x 20 sec bilat Cervical retraction x 10 HS stretch 2 x 30 sec bilat   [facility] Adult PT Treatment:                                                DATE: 07/19/24 Therapeutic Exercise/Activity: Nustep L6 x 5 min for warm up Standing Gastroc stretch at wall 2 x  20 sec Row green TB x 20 T red TB x 20 Lower trap lift off wall x 10 Hip abd green TB x 15 Hip ext green TB x 15 - cues for glute activation Seated HS stretch 2 x 20 sec Scap squeeze 10 x 3 sec hold Sit <> stand 2 x 10 Supine Bridge x 20 --> bridge with add squeeze x 10 Knee flex/ext with LEs on physioball Cervical retraction 10 x 3 sec hold Cervical rotation 5 x 10 sec hold    PATIENT EDUCATION:  Education details: see treatment; POC  Person educated: Patient Education method: Explanation, Demonstration, Tactile cues, Verbal cues, and Handouts Education comprehension: verbalized understanding, returned demonstration, verbal cues required, tactile cues required, and needs further education  HOME EXERCISE PROGRAM: Access Code: [email or web address] URL: [email or web address] Date: 07/26/2024 Prepared by: Darice Conine  Exercises - Gastroc Stretch on Wall  - 1 x daily - 7 x weekly - 3 sets - 30 sec  hold - Seated Hamstring Stretch  - 1 x daily - 7 x weekly - 3 sets - 30 sec  hold - Seated Cervical Rotation AROM  - 1 x daily - 7 x weekly - 1 sets - 10 reps - 10 seconds hold - Supine Cervical Retraction with Towel  - 1 x daily - 7 x weekly - 2 sets - 10 reps - Squat with Counter Support  - 1 x daily - 7 x weekly - 2 sets - 10 reps - Standing Heel Raise  - 1 x daily - 7 x weekly - 2 sets - 10 reps - Wall Push Up  - 1 x daily - 7 x weekly - 2 sets - 10 reps  ASSESSMENT:  CLINICAL IMPRESSION: Continued to progress functional strength. Pt states he has difficulty with carrying weight up stairs so added this today with lighter weights. Pt fatigues quickly with hip abd strengthening and requires cues to prevent compensation   OBJECTIVE IMPAIRMENTS: Abnormal gait, decreased activity tolerance, decreased endurance, decreased knowledge of condition, difficulty walking, decreased ROM, decreased strength, impaired flexibility, impaired UE functional use, improper body mechanics,  postural dysfunction, and pain.     GOALS: Goals reviewed with patient? Yes  SHORT TERM GOALS: Target date: 08/15/2024  Patient will be independent and compliant with initial HEP.  Baseline: initial HEP issued Goal status: INITIAL  2.  Patient will demonstrate at least 50 degrees of cervical rotation AROM to improve ability to scan environment when walking.  Baseline: see above Goal status: INITIAL  3.  Patient will demonstrate at least 120 degrees of bilateral knee flexion AROM to improve ease of transfers.  Baseline: see above  07/31/24: 120 (bil) Goal status: MET   LONG TERM GOALS: Target date: 09/16/24  Patient will improve average PSFS score by at least 2 points to signify clinically meaningful improvement in functional abilities.  Baseline: see above Goal status: INITIAL  2.  Patient will report at least a 50% improvement in neck/knee pain to reduce current functional limitations.  Baseline: 0% improvement Goal status: INITIAL  3.  Patient will demonstrate at least 4+/5 bilateral hip abductor/extensor strength to improve stability about the chain with walking activity.  Baseline: see above  Goal status: INITIAL  4.  Patient will demonstrate at least 4+/5 bilateral middle trap strength to improve postural stability.  Baseline: see above Goal status: INITIAL  5.  Patient will be independent with advanced home program to progress/maintain current level of function.  Baseline: initial HEP issued  Goal status: INITIAL    PLAN:  PT FREQUENCY: 2x/week  PT DURATION: 8 weeks  PLANNED INTERVENTIONS: [id number]- PT Re-evaluation, 97750- Physical Performance Testing, 97110-Therapeutic exercises, 97530- Therapeutic activity, [id number]- Neuromuscular re-education, 97535- Self Care, [id number]- Manual therapy, [id number]- Gait training, [phone] (1-2 muscles), [id number] (3+ muscles)- Dry Needling, Cryotherapy, and Moist heat  PLAN FOR NEXT SESSION: functional strength (step ups, squats, carry items)   Hip/knee strengthening & stretching, neck mobility, postural correctives   Lamarr Price, PTA 08/01/24, 4:08 PM

## 2024-08-03 ENCOUNTER — Other Ambulatory Visit: Payer: Self-pay | Admitting: Physician Assistant

## 2024-08-03 DIAGNOSIS — I10 Essential (primary) hypertension: Secondary | ICD-10-CM

## 2024-08-07 ENCOUNTER — Ambulatory Visit

## 2024-08-07 DIAGNOSIS — M542 Cervicalgia: Secondary | ICD-10-CM

## 2024-08-07 DIAGNOSIS — G8929 Other chronic pain: Secondary | ICD-10-CM

## 2024-08-07 DIAGNOSIS — M6281 Muscle weakness (generalized): Secondary | ICD-10-CM

## 2024-08-07 DIAGNOSIS — M25561 Pain in right knee: Secondary | ICD-10-CM | POA: Diagnosis not present

## 2024-08-07 NOTE — Therapy (Signed)
 [facility]   Patient Name: Brandon Mckee MRN: [id number] DOB:1966/05/14, 58 y.o., male Today's Date: 08/07/2024  END OF SESSION:  PT End of Session - 08/07/24 1530     Visit Number 6    Number of Visits 17    Date for PT Re-Evaluation 09/16/24    Authorization Type [employer] MCD    Authorization - Visit Number 6    Authorization - Number of Visits 27    PT Start Time 1530    PT Stop Time 1610    PT Time Calculation (min) 40 min    Activity Tolerance Patient tolerated treatment well    Behavior During Therapy WFL for tasks assessed/performed          Past Medical History:  Diagnosis Date   Hyperlipidemia    Hypertension    Metabolic syndrome    IFG   Pes anserine bursitis    bilateral   Past Surgical History:  Procedure Laterality Date   APPENDECTOMY     Patient Active Problem List   Diagnosis Date Noted   Cervical spondylosis 07/17/2024   History of CVA (cerebrovascular accident) 12/06/2023   Thrombocytopenia (HCC) 12/06/2023   Hypokalemia 12/06/2023   Uncontrolled type 2 diabetes mellitus with hyperglycemia (HCC) 12/06/2023   Poorly-controlled hypertension 12/06/2023   Type 2 diabetes mellitus with hyperglycemia, without long-term current use of insulin (HCC) 12/06/2023   Hemiparesis of right dominant side due to acute cerebrovascular disease (HCC) 12/06/2023   Allergic reaction 07/16/2020   Gastroesophageal reflux disease with esophagitis 05/13/2018   Chronic gout of multiple sites 05/13/2018   Obesity (BMI 30-39.9) 09/10/2017   Grief reaction 08/09/2017   Benign prostatic hyperplasia without lower urinary tract symptoms 04/25/2017   Colon polyps 01/01/2017   Diverticulosis of colon without hemorrhage 01/01/2017   Trapezius muscle spasm 11/24/2016   Hypogonadism in male 07/03/2016   Microalbuminuria 04/01/2016   Osteoarthritis of right acromioclavicular joint 01/17/2016   Dyslipidemia 01/07/2016   Vitamin D  deficiency  01/07/2016   Depression 01/03/2016   Anxiety disorder 01/03/2016   Essential hypertension 01/03/2016   Pre-diabetes 12/07/2014   Male hypogonadism 05/25/2014   Obstructive sleep apnea 10/11/2013   B12 DEFICIENCY 06/30/2010   LEG EDEMA 05/03/2009   Primary osteoarthritis of both knees 12/27/2008   FATIGUE 09/25/2008   DYSMETABOLIC SYNDROME 08/04/2007   Hyperlipidemia LDL goal <70 12/08/2006   Class 2 severe obesity due to excess calories with serious comorbidity and body mass index (BMI) of 35.0 to 35.9 in adult (HCC) 12/08/2006    PCP: Antoniette Vermell CROME, PA-C  REFERRING PROVIDER: Curtis Debby PARAS, MD   REFERRING DIAG: M17.0 (ICD-10-CM) - Primary osteoarthritis of both knees; cervical spondylosis   THERAPY DIAG:  Chronic pain of both knees  Cervicalgia  Muscle weakness (generalized)  Rationale for Evaluation and Treatment: Rehabilitation  ONSET DATE: chronic   SUBJECTIVE:   SUBJECTIVE STATEMENT: Patient reports his knees hurt less going up hills, reports no pain in knees currently. Patient reports no pain in neck currently, 4/10 at it's worst.  PERTINENT HISTORY: CVA with Rt-sided hemiparesis  Obesity   Patient reports since 2008 he has been receiving knee injections for his pain. Then due to lack of insurance he went a few years without injections. Latest injection was in 2018. He feels that his knee pain is worsening and he can't even walk up hills. No popping/clicking in the knees. No instability. The pain is along the medial aspect of bilateral knees.   Patient  reports off/on neck pain, but more recently the pain has been more constant since CVA in December 2024. He reports the pain has been ongoing for at least a year. He attributes his pain to repetitive use of his upper body/previous work activity. He denies any numbness/tingling. No radicular symptoms.   PAIN:  Are you having pain? Yes: NPRS scale: 0 currently, at worst 7 (KNEES); 0 currently, at worst 4  (NECK) Pain location: bilateral medial knees and posterior neck Pain description: throbbing (knees); tight (neck) Aggravating factors: walking up hills, carrying items(knees); neck movement, sleep (neck) Relieving factors: topical analgesic   PRECAUTIONS: None  RED FLAGS: None   WEIGHT BEARING RESTRICTIONS: No  FALLS:  Has patient fallen in last 6 months? No  LIVING ENVIRONMENT: Lives with: lives with their daughter Lives in: House/apartment Stairs: No Has following equipment at home: [name] - 2 wheeled, shower chair, and bed side commode  OCCUPATION: part time- works at [employer]  PLOF: Independent  PATIENT GOALS: relieve a little bit of the pain.   NEXT MD VISIT: nothing scheduled   OBJECTIVE:  Note: Objective measures were completed at Evaluation unless otherwise noted.  DIAGNOSTIC FINDINGS: X-rays pending   PATIENT SURVEYS:  Patient-specific activity scoring scheme (Point to one number):  0 represents "unable to perform." 10 represents "able to perform at prior level. 0 1 2 3 4 5 6 7 8 9  10 (Date and Score) Activity Initial  Activity Eval     Walking uphill   5    Carrying items   5    Sleep  4    Additional Additional Total score = sum of the activity scores/number of activities Minimum detectable change (90%CI) for average score = 2 points Minimum detectable change (90%CI) for single activity score = 3 points PSFS developed by: Rosalee MYRTIS Marvis KYM Charlet CHRISTELLA., & Binkley, J. (1995). Assessing disability and change on individual  patients: a report of a patient specific measure. Physiotherapy [place], 47, [phone]. Reproduced with the permission of the authors  Score: 4.6   COGNITION: Overall cognitive status: Within functional limits for tasks assessed     SENSATION: Not tested  EDEMA:  No obvious swelling about the knees   MUSCLE LENGTH: Hamstrings: Right lacking 40 deg; Left lacking 35 deg   POSTURE: rounded  shoulders and forward head, tibial bowing     LOWER EXTREMITY ROM:  Active ROM Right eval Left eval Right 07/31/24 Left 07/31/24  Hip flexion      Hip extension      Hip abduction      Hip adduction      Hip internal rotation      Hip external rotation      Knee flexion 115 115 123 120  Knee extension WNL WNL    Ankle dorsiflexion      Ankle plantarflexion      Ankle inversion      Ankle eversion       (Blank rows = not tested)  LOWER EXTREMITY MMT:  MMT Right eval Left eval  Hip flexion 4+ 5  Hip extension 4- 4-  Hip abduction 4- 4-  Hip adduction    Hip internal rotation    Hip external rotation    Knee flexion 4+ 5  Knee extension 4+ 5  Ankle dorsiflexion    Ankle plantarflexion    Ankle inversion    Ankle eversion     (Blank rows = not tested) CERVICAL ROM:   Active  ROM A/PROM (deg) eval 08/07/24  Flexion 40   Extension 20   Right lateral flexion    Left lateral flexion    Right rotation 45 55  Left rotation 35 50   (Blank rows = not tested) UPPER EXTREMITY MMT:  MMT Right eval Left eval  Shoulder flexion 5 upper trap compensation 5 upper trap compensation  Shoulder extension    Shoulder abduction 5 5  Shoulder adduction    Shoulder extension    Shoulder internal rotation 5 5  Shoulder external rotation 5 5  Middle trapezius 4- 4-  Lower trapezius    Elbow flexion    Elbow extension    Wrist flexion    Wrist extension    Wrist ulnar deviation    Wrist radial deviation    Wrist pronation    Wrist supination    Grip strength     (Blank rows = not tested)  SPECIAL TESTS: (+) Ely's bilaterally   FUNCTIONAL TESTS:  Not tested   GAIT: Distance walked: 20 ft  Assistive device utilized: None Level of assistance: Complete Independence Comments: decreased Rt arm swing   [facility] Adult PT Treatment:                                                DATE: 08/07/2024 Therapeutic Exercise: NuStep L6 x 5 min Cervical rotation measurements   Gastroc stretch on slant board 2x30 Heel raises off 4 step Neuromuscular re-ed: Bridges --> figure 4 bridge x15 (bil)  SLR + 2#AW x15 (bil) Side Lying: Straight leg hip abd + 2# AW resting on thigh Over & back Bent over hip extension + 2#AW  Bent over hip extension on diagonal + 2#AW Therapeutic Activity: Leg press --> single leg 95# 2x15 (bil) Wall push ups --> elbows down & elbows wide   [facility] Adult PT Treatment:                                                DATE: 08/01/24 Therapeutic Exercise: Nustep L6 x 5 min Gastroc stretch 2 x 30 sec bilat Neuromuscular re-ed: Bridge x 10 Figure 4 bridge x 10 Sidelying Hip abd x 20 - cues for technique to reduce compensation Hip circles CW/CCW Therapeutic Activity: Runners step up 6'' step 1 UE support Wall squat 2 x 10 8'' Step ups with suitcase carry 15# KB x 10 bilat Suitcase carry 40#KB with improved posture noted Lift 15#KB from floor - focus on squat technique - x 10   [facility] Adult PT Treatment:                                                DATE: 07/31/2024 Therapeutic Exercise: NuStep L6 x 6 min Runner's lunge gastroc stretch 2x30 (bil) Supine HS & ITB stretches with strap 2x30 each Neuromuscular re-ed: Wall squats 2x10  Bridge progression: regular stance --> staggered stance --> figure 4 2x10 Side Lying: Hip abd + blue TB around thighs x10 Kicks front/back + blue TB around thighs Leg pulses in extension + Blue TB around thighs  Small circles in abd  Therapeutic Activity: Eccentric heel raises 2x10 Walking + 40#KB suitcase carry x4 L + abdominal bracing Squats + cradling 20#KB 2x10                                                                                                                              PATIENT EDUCATION:  Education details: see treatment; POC  Person educated: Patient Education method: Explanation, Demonstration, Tactile cues, Verbal cues, and Handouts Education comprehension: verbalized  understanding, returned demonstration, verbal cues required, tactile cues required, and needs further education  HOME EXERCISE PROGRAM: Access Code: [email or web address] URL: [email or web address] Date: 07/26/2024 Prepared by: Darice Conine  Exercises - Gastroc Stretch on Wall  - 1 x daily - 7 x weekly - 3 sets - 30 sec  hold - Seated Hamstring Stretch  - 1 x daily - 7 x weekly - 3 sets - 30 sec  hold - Seated Cervical Rotation AROM  - 1 x daily - 7 x weekly - 1 sets - 10 reps - 10 seconds hold - Supine Cervical Retraction with Towel  - 1 x daily - 7 x weekly - 2 sets - 10 reps - Squat with Counter Support  - 1 x daily - 7 x weekly - 2 sets - 10 reps - Standing Heel Raise  - 1 x daily - 7 x weekly - 2 sets - 10 reps - Wall Push Up  - 1 x daily - 7 x weekly - 2 sets - 10 reps  ASSESSMENT:  CLINICAL IMPRESSION: Cueing needed to decrease knee hyperextension tendency during single leg press. Glute strengthening progressed with added resistance during hip extension variations. Cervical rotation AROM has improved to 50 degrees (Lt) and 55 degrees (Rt), meeting STG #2; patient is making good progress with POC goals. Patient continues to report tightness in bilateral calves with side lying LE exercises. Postural strengthening progressed with wall push up variations.   OBJECTIVE IMPAIRMENTS: Abnormal gait, decreased activity tolerance, decreased endurance, decreased knowledge of condition, difficulty walking, decreased ROM, decreased strength, impaired flexibility, impaired UE functional use, improper body mechanics, postural dysfunction, and pain.     GOALS: Goals reviewed with patient? Yes  SHORT TERM GOALS: Target date: 08/15/2024  Patient will be independent and compliant with initial HEP.  Baseline: initial HEP issued Goal status: INITIAL  2.  Patient will demonstrate at least 50 degrees of cervical rotation AROM to improve ability to scan environment when walking.  Baseline: see  above 08/07/24: (Rt) 55, (Lt) 50  Goal status: MET  3.  Patient will demonstrate at least 120 degrees of bilateral knee flexion AROM to improve ease of transfers.  Baseline: see above  07/31/24: 120 (bil) Goal status: MET   LONG TERM GOALS: Target date: 09/16/24  Patient will improve average PSFS score by at least 2 points to signify clinically meaningful improvement in functional abilities.  Baseline: see above Goal status: INITIAL  2.  Patient will report at least a 50%  improvement in neck/knee pain to reduce current functional limitations.  Baseline: 0% improvement Goal status: INITIAL  3.  Patient will demonstrate at least 4+/5 bilateral hip abductor/extensor strength to improve stability about the chain with walking activity.  Baseline: see above  Goal status: INITIAL  4.  Patient will demonstrate at least 4+/5 bilateral middle trap strength to improve postural stability.  Baseline: see above Goal status: INITIAL  5.  Patient will be independent with advanced home program to progress/maintain current level of function.  Baseline: initial HEP issued  Goal status: INITIAL    PLAN:  PT FREQUENCY: 2x/week  PT DURATION: 8 weeks  PLANNED INTERVENTIONS: [id number]- PT Re-evaluation, 97750- Physical Performance Testing, 97110-Therapeutic exercises, 97530- Therapeutic activity, [id number]- Neuromuscular re-education, 97535- Self Care, [id number]- Manual therapy, [id number]- Gait training, [phone] (1-2 muscles), [id number] (3+ muscles)- Dry Needling, Cryotherapy, and Moist heat  PLAN FOR NEXT SESSION: functional strength (step ups, squats, carry items)  Hip/knee strengthening & stretching, neck mobility, postural correctives   Lamarr Price, PTA 08/07/24, 4:21 PM

## 2024-08-08 ENCOUNTER — Encounter: Payer: Self-pay | Admitting: Physical Therapy

## 2024-08-08 ENCOUNTER — Ambulatory Visit: Admitting: Physical Therapy

## 2024-08-08 DIAGNOSIS — G8929 Other chronic pain: Secondary | ICD-10-CM

## 2024-08-08 DIAGNOSIS — M542 Cervicalgia: Secondary | ICD-10-CM

## 2024-08-08 DIAGNOSIS — M6281 Muscle weakness (generalized): Secondary | ICD-10-CM

## 2024-08-08 DIAGNOSIS — M25561 Pain in right knee: Secondary | ICD-10-CM | POA: Diagnosis not present

## 2024-08-08 NOTE — Therapy (Signed)
 [facility]   Patient Name: Brandon Mckee MRN: [id number] DOB:[birth year], 58 y.o., male Today's Date: 08/08/2024  END OF SESSION:  PT End of Session - 08/08/24 1613     Visit Number 7    Number of Visits 17    Date for PT Re-Evaluation 09/16/24    Authorization Type [employer] MCD    Authorization - Visit Number 7    Authorization - Number of Visits 27    PT Start Time 1530    PT Stop Time 1610    PT Time Calculation (min) 40 min    Activity Tolerance Patient tolerated treatment well    Behavior During Therapy WFL for tasks assessed/performed           Past Medical History:  Diagnosis Date   Hyperlipidemia    Hypertension    Metabolic syndrome    IFG   Pes anserine bursitis    bilateral   Past Surgical History:  Procedure Laterality Date   APPENDECTOMY     Patient Active Problem List   Diagnosis Date Noted   Cervical spondylosis 07/17/2024   History of CVA (cerebrovascular accident) 12/06/2023   Thrombocytopenia (HCC) 12/06/2023   Hypokalemia 12/06/2023   Uncontrolled type 2 diabetes mellitus with hyperglycemia (HCC) 12/06/2023   Poorly-controlled hypertension 12/06/2023   Type 2 diabetes mellitus with hyperglycemia, without long-term current use of insulin (HCC) 12/06/2023   Hemiparesis of right dominant side due to acute cerebrovascular disease (HCC) 12/06/2023   Allergic reaction 07/16/2020   Gastroesophageal reflux disease with esophagitis 05/13/2018   Chronic gout of multiple sites 05/13/2018   Obesity (BMI 30-39.9) 09/10/2017   Grief reaction 08/09/2017   Benign prostatic hyperplasia without lower urinary tract symptoms 04/25/2017   Colon polyps 01/01/2017   Diverticulosis of colon without hemorrhage 01/01/2017   Trapezius muscle spasm 11/24/2016   Hypogonadism in male 07/03/2016   Microalbuminuria 04/01/2016   Osteoarthritis of right acromioclavicular joint 01/17/2016   Dyslipidemia 01/07/2016   Vitamin D  deficiency  01/07/2016   Depression 01/03/2016   Anxiety disorder 01/03/2016   Essential hypertension 01/03/2016   Pre-diabetes 12/07/2014   Male hypogonadism 05/25/2014   Obstructive sleep apnea 10/11/2013   B12 DEFICIENCY 06/30/2010   LEG EDEMA 05/03/2009   Primary osteoarthritis of both knees 12/27/2008   FATIGUE 09/25/2008   DYSMETABOLIC SYNDROME 08/04/2007   Hyperlipidemia LDL goal <70 12/08/2006   Class 2 severe obesity due to excess calories with serious comorbidity and body mass index (BMI) of 35.0 to 35.9 in adult [facility]) 12/08/2006    PCP: Antoniette Vermell CROME, PA-C  REFERRING PROVIDER: Curtis Debby PARAS, MD   REFERRING DIAG: M17.0 (ICD-10-CM) - Primary osteoarthritis of both knees; cervical spondylosis   THERAPY DIAG:  Chronic pain of both knees  Cervicalgia  Muscle weakness (generalized)  Rationale for Evaluation and Treatment: Rehabilitation  ONSET DATE: chronic   SUBJECTIVE:   SUBJECTIVE STATEMENT: Patient reports his knee was sore after therapy and working yesterday. He feels better today  PERTINENT HISTORY: CVA with Rt-sided hemiparesis  Obesity   Patient reports since 2008 he has been receiving knee injections for his pain. Then due to lack of insurance he went a few years without injections. Latest injection was in 2018. He feels that his knee pain is worsening and he can't even walk up hills. No popping/clicking in the knees. No instability. The pain is along the medial aspect of bilateral knees.   Patient reports off/on neck pain, but more recently the pain has  been more constant since CVA in December 2024. He reports the pain has been ongoing for at least a year. He attributes his pain to repetitive use of his upper body/previous work activity. He denies any numbness/tingling. No radicular symptoms.   PAIN:  Are you having pain? Yes: NPRS scale: 0 currently, at worst 7 (KNEES); 0 currently, at worst 4 (NECK) Pain location: bilateral medial knees and posterior  neck Pain description: throbbing (knees); tight (neck) Aggravating factors: walking up hills, carrying items(knees); neck movement, sleep (neck) Relieving factors: topical analgesic   PRECAUTIONS: None  RED FLAGS: None   WEIGHT BEARING RESTRICTIONS: No  FALLS:  Has patient fallen in last 6 months? No  LIVING ENVIRONMENT: Lives with: lives with their daughter Lives in: House/apartment Stairs: No Has following equipment at home: [name] - 2 wheeled, shower chair, and bed side commode  OCCUPATION: part time- works at [employer]  PLOF: Independent  PATIENT GOALS: relieve a little bit of the pain.   NEXT MD VISIT: nothing scheduled   OBJECTIVE:  Note: Objective measures were completed at Evaluation unless otherwise noted.  DIAGNOSTIC FINDINGS: X-rays pending   PATIENT SURVEYS:  Patient-specific activity scoring scheme (Point to one number):  0 represents "unable to perform." 10 represents "able to perform at prior level. 0 1 2 3 4 5 6 7 8 9  10 (Date and Score) Activity Initial  Activity Eval   08/08/24  Walking uphill   5  5  Carrying items   5  8  Sleep  4 8   Additional Additional Total score = sum of the activity scores/number of activities Minimum detectable change (90%CI) for average score = 2 points Minimum detectable change (90%CI) for single activity score = 3 points PSFS developed by: Rosalee MYRTIS Marvis KYM Charlet CHRISTELLA., & Binkley, J. (1995). Assessing disability and change on individual  patients: a report of a patient specific measure. Physiotherapy [place], 47, [phone]. Reproduced with the permission of the authors  Score: 4.6   COGNITION: Overall cognitive status: Within functional limits for tasks assessed     SENSATION: Not tested  EDEMA:  No obvious swelling about the knees   MUSCLE LENGTH: Hamstrings: Right lacking 40 deg; Left lacking 35 deg   POSTURE: rounded shoulders and forward head, tibial bowing      LOWER EXTREMITY ROM:  Active ROM Right eval Left eval Right 07/31/24 Left 07/31/24  Hip flexion      Hip extension      Hip abduction      Hip adduction      Hip internal rotation      Hip external rotation      Knee flexion 115 115 123 120  Knee extension WNL WNL    Ankle dorsiflexion      Ankle plantarflexion      Ankle inversion      Ankle eversion       (Blank rows = not tested)  LOWER EXTREMITY MMT:  MMT Right eval Left eval  Hip flexion 4+ 5  Hip extension 4- 4-  Hip abduction 4- 4-  Hip adduction    Hip internal rotation    Hip external rotation    Knee flexion 4+ 5  Knee extension 4+ 5  Ankle dorsiflexion    Ankle plantarflexion    Ankle inversion    Ankle eversion     (Blank rows = not tested) CERVICAL ROM:   Active ROM A/PROM (deg) eval 08/07/24  Flexion 40  Extension 20   Right lateral flexion    Left lateral flexion    Right rotation 45 55  Left rotation 35 50   (Blank rows = not tested) UPPER EXTREMITY MMT:  MMT Right eval Left eval  Shoulder flexion 5 upper trap compensation 5 upper trap compensation  Shoulder extension    Shoulder abduction 5 5  Shoulder adduction    Shoulder extension    Shoulder internal rotation 5 5  Shoulder external rotation 5 5  Middle trapezius 4- 4-  Lower trapezius    Elbow flexion    Elbow extension    Wrist flexion    Wrist extension    Wrist ulnar deviation    Wrist radial deviation    Wrist pronation    Wrist supination    Grip strength     (Blank rows = not tested)  SPECIAL TESTS: (+) Ely's bilaterally   FUNCTIONAL TESTS:  Not tested   GAIT: Distance walked: 20 ft  Assistive device utilized: None Level of assistance: Complete Independence Comments: decreased Rt arm swing   [facility] Adult PT Treatment:                                                DATE: 08/08/24 Therapeutic Exercise: Heel raise off step x 10 Gastroc stretch on slant board 2 x 30 sec Hip abd 5# (matrix) x 15 Hip  ext 10# (matrix) x 20 Hip add 5# (matrix) x 15  Therapeutic Activity: Squat to overhead lift 15# KB x 10 Simulated bowling with 10# KB with 3 step approach Wall push up wide/narrow x 10 each Gait: Walking downhill/uphill outdoors - pt with increased knee pain to 2/10 with walking uphill   [facility] Adult PT Treatment:                                                DATE: 08/07/2024 Therapeutic Exercise: NuStep L6 x 5 min Cervical rotation measurements  Gastroc stretch on slant board 2x30 Heel raises off 4 step Neuromuscular re-ed: Bridges --> figure 4 bridge x15 (bil)  SLR + 2#AW x15 (bil) Side Lying: Straight leg hip abd + 2# AW resting on thigh Over & back Bent over hip extension + 2#AW  Bent over hip extension on diagonal + 2#AW Therapeutic Activity: Leg press --> single leg 95# 2x15 (bil) Wall push ups --> elbows down & elbows wide   [facility] Adult PT Treatment:                                                DATE: 08/01/24 Therapeutic Exercise: Nustep L6 x 5 min Gastroc stretch 2 x 30 sec bilat Neuromuscular re-ed: Bridge x 10 Figure 4 bridge x 10 Sidelying Hip abd x 20 - cues for technique to reduce compensation Hip circles CW/CCW Therapeutic Activity: Runners step up 6'' step 1 UE support Wall squat 2 x 10 8'' Step ups with suitcase carry 15# KB x 10 bilat Suitcase carry 40#KB with improved posture noted Lift 15#KB from floor - focus on squat technique - x 10   [facility] Adult  PT Treatment:                                                DATE: 07/31/2024 Therapeutic Exercise: NuStep L6 x 6 min Runner's lunge gastroc stretch 2x30 (bil) Supine HS & ITB stretches with strap 2x30 each Neuromuscular re-ed: Wall squats 2x10  Bridge progression: regular stance --> staggered stance --> figure 4 2x10 Side Lying: Hip abd + blue TB around thighs x10 Kicks front/back + blue TB around thighs Leg pulses in extension + Blue TB around thighs  Small circles in abd  Therapeutic  Activity: Eccentric heel raises 2x10 Walking + 40#KB suitcase carry x4 L + abdominal bracing Squats + cradling 20#KB 2x10                                                                                                                              PATIENT EDUCATION:  Education details: see treatment; POC  Person educated: Patient Education method: Explanation, Demonstration, Tactile cues, Verbal cues, and Handouts Education comprehension: verbalized understanding, returned demonstration, verbal cues required, tactile cues required, and needs further education  HOME EXERCISE PROGRAM: Access Code: [email or web address] URL: [email or web address] Date: 07/26/2024 Prepared by: Darice Conine  Exercises - Gastroc Stretch on Wall  - 1 x daily - 7 x weekly - 3 sets - 30 sec  hold - Seated Hamstring Stretch  - 1 x daily - 7 x weekly - 3 sets - 30 sec  hold - Seated Cervical Rotation AROM  - 1 x daily - 7 x weekly - 1 sets - 10 reps - 10 seconds hold - Supine Cervical Retraction with Towel  - 1 x daily - 7 x weekly - 2 sets - 10 reps - Squat with Counter Support  - 1 x daily - 7 x weekly - 2 sets - 10 reps - Standing Heel Raise  - 1 x daily - 7 x weekly - 2 sets - 10 reps - Wall Push Up  - 1 x daily - 7 x weekly - 2 sets - 10 reps  ASSESSMENT:  CLINICAL IMPRESSION: Pt continues to progress well. He has improved carrying and sleeping items on PSFS and has met his LTG. He continues with difficulty with walking uphill due to knee pain. Plan to continue to focus on LE strength to decrease pain and difficulty with hill navigation   OBJECTIVE IMPAIRMENTS: Abnormal gait, decreased activity tolerance, decreased endurance, decreased knowledge of condition, difficulty walking, decreased ROM, decreased strength, impaired flexibility, impaired UE functional use, improper body mechanics, postural dysfunction, and pain.     GOALS: Goals reviewed with patient? Yes  SHORT TERM GOALS: Target date:  08/15/2024  Patient will be independent and compliant with initial HEP.  Baseline: initial HEP issued Goal status: MET  2.  Patient will demonstrate at least 50  degrees of cervical rotation AROM to improve ability to scan environment when walking.  Baseline: see above 08/07/24: (Rt) 55, (Lt) 50  Goal status: MET  3.  Patient will demonstrate at least 120 degrees of bilateral knee flexion AROM to improve ease of transfers.  Baseline: see above  07/31/24: 120 (bil) Goal status: MET   LONG TERM GOALS: Target date: 09/16/24  Patient will improve average PSFS score by at least 2 points to signify clinically meaningful improvement in functional abilities.  Baseline: see above Goal status: MET 8/19  2.  Patient will report at least a 50% improvement in neck/knee pain to reduce current functional limitations.  Baseline: 0% improvement Goal status: INITIAL  3.  Patient will demonstrate at least 4+/5 bilateral hip abductor/extensor strength to improve stability about the chain with walking activity.  Baseline: see above  Goal status: INITIAL  4.  Patient will demonstrate at least 4+/5 bilateral middle trap strength to improve postural stability.  Baseline: see above Goal status: INITIAL  5.  Patient will be independent with advanced home program to progress/maintain current level of function.  Baseline: initial HEP issued  Goal status: INITIAL    PLAN:  PT FREQUENCY: 2x/week  PT DURATION: 8 weeks  PLANNED INTERVENTIONS: [id number]- PT Re-evaluation, 97750- Physical Performance Testing, 97110-Therapeutic exercises, 97530- Therapeutic activity, [id number]- Neuromuscular re-education, 97535- Self Care, [id number]- Manual therapy, [id number]- Gait training, [phone] (1-2 muscles), [id number] (3+ muscles)- Dry Needling, Cryotherapy, and Moist heat  PLAN FOR NEXT SESSION: focus on hip/knee/ankle strength to walk uphill with decreased pain  Darice Conine, PT,DPT08/19/254:14 PM

## 2024-08-14 ENCOUNTER — Ambulatory Visit

## 2024-08-14 DIAGNOSIS — M25561 Pain in right knee: Secondary | ICD-10-CM | POA: Diagnosis not present

## 2024-08-14 DIAGNOSIS — M542 Cervicalgia: Secondary | ICD-10-CM

## 2024-08-14 DIAGNOSIS — M6281 Muscle weakness (generalized): Secondary | ICD-10-CM

## 2024-08-14 DIAGNOSIS — G8929 Other chronic pain: Secondary | ICD-10-CM

## 2024-08-14 NOTE — Therapy (Signed)
 [facility]   Patient Name: Brandon Mckee MRN: [id number] DOB:[birth year], 58 y.o., male Today's Date: 08/15/2024  END OF SESSION:  PT End of Session - 08/15/24 0925     Visit Number 9    Number of Visits 17    Date for PT Re-Evaluation 09/16/24    Authorization Type [employer] MCD    Authorization - Visit Number 9    Authorization - Number of Visits 27    PT Start Time 0930    PT Stop Time 1010    PT Time Calculation (min) 40 min            Past Medical History:  Diagnosis Date   Hyperlipidemia    Hypertension    Metabolic syndrome    IFG   Pes anserine bursitis    bilateral   Past Surgical History:  Procedure Laterality Date   APPENDECTOMY     Patient Active Problem List   Diagnosis Date Noted   Cervical spondylosis 07/17/2024   History of CVA (cerebrovascular accident) 12/06/2023   Thrombocytopenia (HCC) 12/06/2023   Hypokalemia 12/06/2023   Uncontrolled type 2 diabetes mellitus with hyperglycemia (HCC) 12/06/2023   Poorly-controlled hypertension 12/06/2023   Type 2 diabetes mellitus with hyperglycemia, without long-term current use of insulin (HCC) 12/06/2023   Hemiparesis of right dominant side due to acute cerebrovascular disease (HCC) 12/06/2023   Allergic reaction 07/16/2020   Gastroesophageal reflux disease with esophagitis 05/13/2018   Chronic gout of multiple sites 05/13/2018   Obesity (BMI 30-39.9) 09/10/2017   Grief reaction 08/09/2017   Benign prostatic hyperplasia without lower urinary tract symptoms 04/25/2017   Colon polyps 01/01/2017   Diverticulosis of colon without hemorrhage 01/01/2017   Trapezius muscle spasm 11/24/2016   Hypogonadism in male 07/03/2016   Microalbuminuria 04/01/2016   Osteoarthritis of right acromioclavicular joint 01/17/2016   Dyslipidemia 01/07/2016   Vitamin D  deficiency 01/07/2016   Depression 01/03/2016   Anxiety disorder 01/03/2016   Essential hypertension 01/03/2016    Pre-diabetes 12/07/2014   Male hypogonadism 05/25/2014   Obstructive sleep apnea 10/11/2013   B12 DEFICIENCY 06/30/2010   LEG EDEMA 05/03/2009   Primary osteoarthritis of both knees 12/27/2008   FATIGUE 09/25/2008   DYSMETABOLIC SYNDROME 08/04/2007   Hyperlipidemia LDL goal <70 12/08/2006   Class 2 severe obesity due to excess calories with serious comorbidity and body mass index (BMI) of 35.0 to 35.9 in adult (HCC) 12/08/2006    PCP: Antoniette Vermell CROME, PA-C  REFERRING PROVIDER: Curtis Debby PARAS, MD   REFERRING DIAG: M17.0 (ICD-10-CM) - Primary osteoarthritis of both knees; cervical spondylosis   THERAPY DIAG:  Chronic pain of both knees  Cervicalgia  Muscle weakness (generalized)  Rationale for Evaluation and Treatment: Rehabilitation  ONSET DATE: chronic   SUBJECTIVE:   SUBJECTIVE STATEMENT: 08/15/2024: Pt states he did well after last session, felt a little bit of calf tightness BIL, knee felt okay. States his neck really hasn't been bothering him the last couple of weeks. With the knees, still having most difficulty w/ carrying objects and walking uphill.    PERTINENT HISTORY: CVA with Rt-sided hemiparesis  Obesity   Patient reports since 2008 he has been receiving knee injections for his pain. Then due to lack of insurance he went a few years without injections. Latest injection was in 2018. He feels that his knee pain is worsening and he can't even walk up hills. No popping/clicking in the knees. No instability. The pain is along the medial aspect of  bilateral knees.   Patient reports off/on neck pain, but more recently the pain has been more constant since CVA in December 2024. He reports the pain has been ongoing for at least a year. He attributes his pain to repetitive use of his upper body/previous work activity. He denies any numbness/tingling. No radicular symptoms.   PAIN:  Are you having pain? Yes: NPRS scale: 0 currently, at worst 7 (KNEES); 0 currently,  at worst 4 (NECK) Pain location: bilateral medial knees and posterior neck Pain description: throbbing (knees); tight (neck) Aggravating factors: walking up hills, carrying items(knees); neck movement, sleep (neck) Relieving factors: topical analgesic   PRECAUTIONS: None  RED FLAGS: None   WEIGHT BEARING RESTRICTIONS: No  FALLS:  Has patient fallen in last 6 months? No  LIVING ENVIRONMENT: Lives with: lives with their daughter Lives in: House/apartment Stairs: No Has following equipment at home: [name] - 2 wheeled, shower chair, and bed side commode  OCCUPATION: part time- works at [employer]  PLOF: Independent  PATIENT GOALS: relieve a little bit of the pain.   NEXT MD VISIT: nothing scheduled   OBJECTIVE:  Note: Objective measures were completed at Evaluation unless otherwise noted.  DIAGNOSTIC FINDINGS: X-rays pending   PATIENT SURVEYS:  Patient-specific activity scoring scheme (Point to one number):  0 represents "unable to perform." 10 represents "able to perform at prior level. 0 1 2 3 4 5 6 7 8 9  10 (Date and Score) Activity Initial  Activity Eval   08/08/24  Walking uphill   5  5  Carrying items   5  8  Sleep  4 8   Additional Additional Total score = sum of the activity scores/number of activities Minimum detectable change (90%CI) for average score = 2 points Minimum detectable change (90%CI) for single activity score = 3 points PSFS developed by: Rosalee MYRTIS Marvis KYM Charlet CHRISTELLA., & Binkley, J. (1995). Assessing disability and change on individual  patients: a report of a patient specific measure. Physiotherapy [place], 47, [phone]. Reproduced with the permission of the authors  Score: 4.6   COGNITION: Overall cognitive status: Within functional limits for tasks assessed     SENSATION: Not tested  EDEMA:  No obvious swelling about the knees   MUSCLE LENGTH: Hamstrings: Right lacking 40 deg; Left lacking 35  deg   POSTURE: rounded shoulders and forward head, tibial bowing     LOWER EXTREMITY ROM:  Active ROM Right eval Left eval Right 07/31/24 Left 07/31/24  Hip flexion      Hip extension      Hip abduction      Hip adduction      Hip internal rotation      Hip external rotation      Knee flexion 115 115 123 120  Knee extension WNL WNL    Ankle dorsiflexion      Ankle plantarflexion      Ankle inversion      Ankle eversion       (Blank rows = not tested)  LOWER EXTREMITY MMT:  MMT Right eval Left eval  Hip flexion 4+ 5  Hip extension 4- 4-  Hip abduction 4- 4-  Hip adduction    Hip internal rotation    Hip external rotation    Knee flexion 4+ 5  Knee extension 4+ 5  Ankle dorsiflexion    Ankle plantarflexion    Ankle inversion    Ankle eversion     (Blank rows = not tested)  CERVICAL ROM:   Active ROM A/PROM (deg) eval 08/07/24  Flexion 40   Extension 20   Right lateral flexion    Left lateral flexion    Right rotation 45 55  Left rotation 35 50   (Blank rows = not tested) UPPER EXTREMITY MMT:  MMT Right eval Left eval  Shoulder flexion 5 upper trap compensation 5 upper trap compensation  Shoulder extension    Shoulder abduction 5 5  Shoulder adduction    Shoulder extension    Shoulder internal rotation 5 5  Shoulder external rotation 5 5  Middle trapezius 4- 4-  Lower trapezius    Elbow flexion    Elbow extension    Wrist flexion    Wrist extension    Wrist ulnar deviation    Wrist radial deviation    Wrist pronation    Wrist supination    Grip strength     (Blank rows = not tested)  SPECIAL TESTS: (+) Ely's bilaterally   FUNCTIONAL TESTS:  Not tested   GAIT: Distance walked: 20 ft  Assistive device utilized: None Level of assistance: Complete Independence Comments: decreased Rt arm swing   [facility] Adult PT Treatment:                                                DATE: 08/15/24  Neuromuscular re-ed: 15# front carry + high march  4x58ft  Strong band suitcase carry 10# 5x58ft BIL  Reactive stop/go 6 laps (3 CW/3CCW) for deceleration work   Therapeutic Activity: Nu step LE/UE L6  Farmer's carry 10# BIL 3 laps around gym Farmer's carry retro walk 4 x27ft 10# BIL cues for posture  Fwd lunge 2x8 BIL w/ UE support PRN; cues for comfortable ROM and mechanics     [facility] Adult PT Treatment:                                                DATE: 08/14/2024 Therapeutic Exercise: NuStep L6 x 5 min  Standing HS & gastroc stretches at stairs Seated HS stretch with foot propped on 8 box Seated hip flexor stretch x30 each leg Neuromuscular re-ed: Standing: Hip abd + blue TB x20 (bil) Hip extension + blue TB x15 (bil) HS curls + green TB  2x10 (bil) Seated LAQ + 5#AW x 20 (bil) Therapeutic Activity: Resisted fwd/bkwd walking + 25# on matrix x10 each way Heel raises off 4 step 2x10 Squat resisted side step + black TB around thighs Marching + 5#AW 4x10' Butt kickers + 5#AW 4x10' Step up 8 box leading with Lt LE + 10#KB farmer carry Walking around building + up/down long stairway with 10#KB farmer carry   [facility] Adult PT Treatment:                                                DATE: 08/08/24 Therapeutic Exercise: Heel raise off step x 10 Gastroc stretch on slant board 2 x 30 sec Hip abd 5# (matrix) x 15 Hip ext 10# (matrix) x 20 Hip add 5# (matrix) x 15  Therapeutic Activity: Squat to  overhead lift 15# KB x 10 Simulated bowling with 10# KB with 3 step approach Wall push up wide/narrow x 10 each Gait: Walking downhill/uphill outdoors - pt with increased knee pain to 2/10 with walking uphill   [facility] Adult PT Treatment:                                                DATE: 08/07/2024 Therapeutic Exercise: NuStep L6 x 5 min Cervical rotation measurements  Gastroc stretch on slant board 2x30 Heel raises off 4 step Neuromuscular re-ed: Bridges --> figure 4 bridge x15 (bil)  SLR + 2#AW x15 (bil) Side  Lying: Straight leg hip abd + 2# AW resting on thigh Over & back Bent over hip extension + 2#AW  Bent over hip extension on diagonal + 2#AW Therapeutic Activity: Leg press --> single leg 95# 2x15 (bil) Wall push ups --> elbows down & elbows wide   [facility] Adult PT Treatment:                                                DATE: 08/01/24 Therapeutic Exercise: Nustep L6 x 5 min Gastroc stretch 2 x 30 sec bilat Neuromuscular re-ed: Bridge x 10 Figure 4 bridge x 10 Sidelying Hip abd x 20 - cues for technique to reduce compensation Hip circles CW/CCW Therapeutic Activity: Runners step up 6'' step 1 UE support Wall squat 2 x 10 8'' Step ups with suitcase carry 15# KB x 10 bilat Suitcase carry 40#KB with improved posture noted Lift 15#KB from floor - focus on squat technique - x 10                                                                                                                             PATIENT EDUCATION:  Education details: see treatment; POC  Person educated: Patient Education method: Explanation, Demonstration, Tactile cues, Verbal cues, and Handouts Education comprehension: verbalized understanding, returned demonstration, verbal cues required, tactile cues required, and needs further education  HOME EXERCISE PROGRAM: Access Code: [email or web address] URL: [email or web address] Date: 07/26/2024 Prepared by: Darice Conine  Exercises - Gastroc Stretch on Wall  - 1 x daily - 7 x weekly - 3 sets - 30 sec  hold - Seated Hamstring Stretch  - 1 x daily - 7 x weekly - 3 sets - 30 sec  hold - Seated Cervical Rotation AROM  - 1 x daily - 7 x weekly - 1 sets - 10 reps - 10 seconds hold - Supine Cervical Retraction with Towel  - 1 x daily - 7 x weekly - 2 sets - 10 reps - Squat with Counter Support  - 1 x daily - 7 x weekly -  2 sets - 10 reps - Standing Heel Raise  - 1 x daily - 7 x weekly - 2 sets - 10 reps - Wall Push Up  - 1 x daily - 7 x weekly - 2 sets - 10  reps  ASSESSMENT:  CLINICAL IMPRESSION: 08/15/2024: Pt arrives w/o pain, no issues after last session. Today continuing to work towards pt goals of improved carrying and uphill walking with functional strengthening and posterior chain endurance work. Modifications made given consecutive treatment days but tolerates well overall with cues as above, no increases in resting pain or adverse events. Most difficulty w/ lunges. Recommend continuing along current POC in order to address relevant deficits and improve functional tolerance. Pt departs today's session in no acute distress, all voiced questions/concerns addressed appropriately from PT perspective.      OBJECTIVE IMPAIRMENTS: Abnormal gait, decreased activity tolerance, decreased endurance, decreased knowledge of condition, difficulty walking, decreased ROM, decreased strength, impaired flexibility, impaired UE functional use, improper body mechanics, postural dysfunction, and pain.     GOALS: Goals reviewed with patient? Yes  SHORT TERM GOALS: Target date: 08/15/2024  Patient will be independent and compliant with initial HEP.  Baseline: initial HEP issued Goal status: MET  2.  Patient will demonstrate at least 50 degrees of cervical rotation AROM to improve ability to scan environment when walking.  Baseline: see above 08/07/24: (Rt) 55, (Lt) 50  Goal status: MET  3.  Patient will demonstrate at least 120 degrees of bilateral knee flexion AROM to improve ease of transfers.  Baseline: see above  07/31/24: 120 (bil) Goal status: MET   LONG TERM GOALS: Target date: 09/16/24  Patient will improve average PSFS score by at least 2 points to signify clinically meaningful improvement in functional abilities.  Baseline: see above Goal status: MET 8/19  2.  Patient will report at least a 50% improvement in neck/knee pain to reduce current functional limitations.  Baseline: 0% improvement Goal status: INITIAL  3.  Patient will  demonstrate at least 4+/5 bilateral hip abductor/extensor strength to improve stability about the chain with walking activity.  Baseline: see above  Goal status: INITIAL  4.  Patient will demonstrate at least 4+/5 bilateral middle trap strength to improve postural stability.  Baseline: see above Goal status: INITIAL  5.  Patient will be independent with advanced home program to progress/maintain current level of function.  Baseline: initial HEP issued  Goal status: INITIAL    PLAN:  PT FREQUENCY: 2x/week  PT DURATION: 8 weeks  PLANNED INTERVENTIONS: [id number]- PT Re-evaluation, 97750- Physical Performance Testing, 97110-Therapeutic exercises, 97530- Therapeutic activity, [id number]- Neuromuscular re-education, 97535- Self Care, [id number]- Manual therapy, [id number]- Gait training, [phone] (1-2 muscles), [id number] (3+ muscles)- Dry Needling, Cryotherapy, and Moist heat  PLAN FOR NEXT SESSION: focus on hip/knee/ankle strength to walk uphill with decreased pain  Alm DELENA Jenny PT, DPT 08/15/2024 10:14 AM

## 2024-08-14 NOTE — Therapy (Signed)
 [facility]   Patient Name: Brandon Mckee MRN: [id number] DOB:04-06-1966, 58 y.o., male Today's Date: 08/14/2024  END OF SESSION:  PT End of Session - 08/14/24 0847     Visit Number 8    Number of Visits 17    Date for PT Re-Evaluation 09/16/24    Authorization Type [employer] MCD    Authorization - Visit Number 8    Authorization - Number of Visits 27    PT Start Time 0847    PT Stop Time 0940    PT Time Calculation (min) 53 min    Activity Tolerance Patient tolerated treatment well    Behavior During Therapy WFL for tasks assessed/performed         Past Medical History:  Diagnosis Date   Hyperlipidemia    Hypertension    Metabolic syndrome    IFG   Pes anserine bursitis    bilateral   Past Surgical History:  Procedure Laterality Date   APPENDECTOMY     Patient Active Problem List   Diagnosis Date Noted   Cervical spondylosis 07/17/2024   History of CVA (cerebrovascular accident) 12/06/2023   Thrombocytopenia (HCC) 12/06/2023   Hypokalemia 12/06/2023   Uncontrolled type 2 diabetes mellitus with hyperglycemia (HCC) 12/06/2023   Poorly-controlled hypertension 12/06/2023   Type 2 diabetes mellitus with hyperglycemia, without long-term current use of insulin (HCC) 12/06/2023   Hemiparesis of right dominant side due to acute cerebrovascular disease (HCC) 12/06/2023   Allergic reaction 07/16/2020   Gastroesophageal reflux disease with esophagitis 05/13/2018   Chronic gout of multiple sites 05/13/2018   Obesity (BMI 30-39.9) 09/10/2017   Grief reaction 08/09/2017   Benign prostatic hyperplasia without lower urinary tract symptoms 04/25/2017   Colon polyps 01/01/2017   Diverticulosis of colon without hemorrhage 01/01/2017   Trapezius muscle spasm 11/24/2016   Hypogonadism in male 07/03/2016   Microalbuminuria 04/01/2016   Osteoarthritis of right acromioclavicular joint 01/17/2016   Dyslipidemia 01/07/2016   Vitamin D  deficiency  01/07/2016   Depression 01/03/2016   Anxiety disorder 01/03/2016   Essential hypertension 01/03/2016   Pre-diabetes 12/07/2014   Male hypogonadism 05/25/2014   Obstructive sleep apnea 10/11/2013   B12 DEFICIENCY 06/30/2010   LEG EDEMA 05/03/2009   Primary osteoarthritis of both knees 12/27/2008   FATIGUE 09/25/2008   DYSMETABOLIC SYNDROME 08/04/2007   Hyperlipidemia LDL goal <70 12/08/2006   Class 2 severe obesity due to excess calories with serious comorbidity and body mass index (BMI) of 35.0 to 35.9 in adult (HCC) 12/08/2006    PCP: Antoniette Vermell CROME, PA-C  REFERRING PROVIDER: Curtis Debby PARAS, MD   REFERRING DIAG: M17.0 (ICD-10-CM) - Primary osteoarthritis of both knees; cervical spondylosis   THERAPY DIAG:  Chronic pain of both knees  Cervicalgia  Muscle weakness (generalized)  Rationale for Evaluation and Treatment: Rehabilitation  ONSET DATE: chronic   SUBJECTIVE:   SUBJECTIVE STATEMENT: Patient reports his knee is feeling better today, currently has 0/10 pain. Patient states his neck has not been bothering him for about a week or two.  PERTINENT HISTORY: CVA with Rt-sided hemiparesis  Obesity   Patient reports since 2008 he has been receiving knee injections for his pain. Then due to lack of insurance he went a few years without injections. Latest injection was in 2018. He feels that his knee pain is worsening and he can't even walk up hills. No popping/clicking in the knees. No instability. The pain is along the medial aspect of bilateral knees.   Patient  reports off/on neck pain, but more recently the pain has been more constant since CVA in December 2024. He reports the pain has been ongoing for at least a year. He attributes his pain to repetitive use of his upper body/previous work activity. He denies any numbness/tingling. No radicular symptoms.   PAIN:  Are you having pain? Yes: NPRS scale: 0 currently, at worst 7 (KNEES); 0 currently, 0  (NECK) Pain location: bilateral medial knees and posterior neck Pain description: throbbing (knees); tight (neck) Aggravating factors: walking up hills, carrying items(knees); neck movement, sleep (neck) Relieving factors: topical analgesic   PRECAUTIONS: None  RED FLAGS: None   WEIGHT BEARING RESTRICTIONS: No  FALLS:  Has patient fallen in last 6 months? No  LIVING ENVIRONMENT: Lives with: lives with their daughter Lives in: House/apartment Stairs: No Has following equipment at home: [name] - 2 wheeled, shower chair, and bed side commode  OCCUPATION: part time- works at [employer]  PLOF: Independent  PATIENT GOALS: relieve a little bit of the pain.   NEXT MD VISIT: nothing scheduled   OBJECTIVE:  Note: Objective measures were completed at Evaluation unless otherwise noted.  DIAGNOSTIC FINDINGS: X-rays pending   PATIENT SURVEYS:  Patient-specific activity scoring scheme (Point to one number):  0 represents "unable to perform." 10 represents "able to perform at prior level. 0 1 2 3 4 5 6 7 8 9  10 (Date and Score) Activity Initial  Activity Eval   08/08/24  Walking uphill   5  5  Carrying items   5  8  Sleep  4 8   Additional Additional Total score = sum of the activity scores/number of activities Minimum detectable change (90%CI) for average score = 2 points Minimum detectable change (90%CI) for single activity score = 3 points PSFS developed by: Rosalee MYRTIS Marvis KYM Charlet CHRISTELLA., & Binkley, J. (1995). Assessing disability and change on individual  patients: a report of a patient specific measure. Physiotherapy [place], 47, [phone]. Reproduced with the permission of the authors  Score: 4.6   COGNITION: Overall cognitive status: Within functional limits for tasks assessed     SENSATION: Not tested  EDEMA:  No obvious swelling about the knees   MUSCLE LENGTH: Hamstrings: Right lacking 40 deg; Left lacking 35  deg   POSTURE: rounded shoulders and forward head, tibial bowing     LOWER EXTREMITY ROM:  Active ROM Right eval Left eval Right 07/31/24 Left 07/31/24  Hip flexion      Hip extension      Hip abduction      Hip adduction      Hip internal rotation      Hip external rotation      Knee flexion 115 115 123 120  Knee extension WNL WNL    Ankle dorsiflexion      Ankle plantarflexion      Ankle inversion      Ankle eversion       (Blank rows = not tested)  LOWER EXTREMITY MMT:  MMT Right eval Left eval  Hip flexion 4+ 5  Hip extension 4- 4-  Hip abduction 4- 4-  Hip adduction    Hip internal rotation    Hip external rotation    Knee flexion 4+ 5  Knee extension 4+ 5  Ankle dorsiflexion    Ankle plantarflexion    Ankle inversion    Ankle eversion     (Blank rows = not tested) CERVICAL ROM:   Active ROM A/PROM (  deg) eval 08/07/24  Flexion 40   Extension 20   Right lateral flexion    Left lateral flexion    Right rotation 45 55  Left rotation 35 50   (Blank rows = not tested) UPPER EXTREMITY MMT:  MMT Right eval Left eval  Shoulder flexion 5 upper trap compensation 5 upper trap compensation  Shoulder extension    Shoulder abduction 5 5  Shoulder adduction    Shoulder extension    Shoulder internal rotation 5 5  Shoulder external rotation 5 5  Middle trapezius 4- 4-  Lower trapezius    Elbow flexion    Elbow extension    Wrist flexion    Wrist extension    Wrist ulnar deviation    Wrist radial deviation    Wrist pronation    Wrist supination    Grip strength     (Blank rows = not tested)  SPECIAL TESTS: (+) Ely's bilaterally   FUNCTIONAL TESTS:  Not tested   GAIT: Distance walked: 20 ft  Assistive device utilized: None Level of assistance: Complete Independence Comments: decreased Rt arm swing   [facility] Adult PT Treatment:                                                DATE: 08/14/2024 Therapeutic Exercise: NuStep L6 x 5 min  Standing  HS & gastroc stretches at stairs Seated HS stretch with foot propped on 8 box Seated hip flexor stretch x30 each leg Neuromuscular re-ed: Standing: Hip abd + blue TB x20 (bil) Hip extension + blue TB x15 (bil) HS curls + green TB  2x10 (bil) Seated LAQ + 5#AW x 20 (bil) Therapeutic Activity: Resisted fwd/bkwd walking + 25# on matrix x10 each way Heel raises off 4 step 2x10 Squat resisted side step + black TB around thighs Marching + 5#AW 4x10' Butt kickers + 5#AW 4x10' Step up 8 box leading with Lt LE + 10#KB farmer carry Walking around building + up/down long stairway with 10#KB farmer carry   [facility] Adult PT Treatment:                                                DATE: 08/08/24 Therapeutic Exercise: Heel raise off step x 10 Gastroc stretch on slant board 2 x 30 sec Hip abd 5# (matrix) x 15 Hip ext 10# (matrix) x 20 Hip add 5# (matrix) x 15  Therapeutic Activity: Squat to overhead lift 15# KB x 10 Simulated bowling with 10# KB with 3 step approach Wall push up wide/narrow x 10 each Gait: Walking downhill/uphill outdoors - pt with increased knee pain to 2/10 with walking uphill    [facility] Adult PT Treatment:                                                DATE: 08/07/2024 Therapeutic Exercise: NuStep L6 x 5 min Cervical rotation measurements  Gastroc stretch on slant board 2x30 Heel raises off 4 step Neuromuscular re-ed: Bridges --> figure 4 bridge x15 (bil)  SLR + 2#AW x15 (bil) Side Lying: Straight leg hip abd +  2# AW resting on thigh Over & back Bent over hip extension + 2#AW  Bent over hip extension on diagonal + 2#AW Therapeutic Activity: Leg press --> single leg 95# 2x15 (bil) Wall push ups --> elbows down & elbows wide                                                                                                                              PATIENT EDUCATION:  Education details: see treatment; POC  Person educated: Patient Education method:  Explanation, Demonstration, Tactile cues, Verbal cues, and Handouts Education comprehension: verbalized understanding, returned demonstration, verbal cues required, tactile cues required, and needs further education  HOME EXERCISE PROGRAM: Access Code: [email or web address] URL: [email or web address] Date: 07/26/2024 Prepared by: Darice Conine  Exercises - Gastroc Stretch on Wall  - 1 x daily - 7 x weekly - 3 sets - 30 sec  hold - Seated Hamstring Stretch  - 1 x daily - 7 x weekly - 3 sets - 30 sec  hold - Seated Cervical Rotation AROM  - 1 x daily - 7 x weekly - 1 sets - 10 reps - 10 seconds hold - Supine Cervical Retraction with Towel  - 1 x daily - 7 x weekly - 2 sets - 10 reps - Squat with Counter Support  - 1 x daily - 7 x weekly - 2 sets - 10 reps - Standing Heel Raise  - 1 x daily - 7 x weekly - 2 sets - 10 reps - Wall Push Up  - 1 x daily - 7 x weekly - 2 sets - 10 reps  ASSESSMENT:  CLINICAL IMPRESSION: Fatigue in bilateral knees when ascending long staircase but no pain; patient tolerated descending stairs well with no fatigue or pain. Bilateral resistance added with walking and stepping activities to simulate work duties. Variations with resisted walking incorporated to challenge postural stability and bracing mechanics; patient able to complete all with no exacerbation of knee pain.    OBJECTIVE IMPAIRMENTS: Abnormal gait, decreased activity tolerance, decreased endurance, decreased knowledge of condition, difficulty walking, decreased ROM, decreased strength, impaired flexibility, impaired UE functional use, improper body mechanics, postural dysfunction, and pain.     GOALS: Goals reviewed with patient? Yes  SHORT TERM GOALS: Target date: 08/15/2024  Patient will be independent and compliant with initial HEP.  Baseline: initial HEP issued Goal status: MET  2.  Patient will demonstrate at least 50 degrees of cervical rotation AROM to improve ability to scan  environment when walking.  Baseline: see above 08/07/24: (Rt) 55, (Lt) 50  Goal status: MET  3.  Patient will demonstrate at least 120 degrees of bilateral knee flexion AROM to improve ease of transfers.  Baseline: see above  07/31/24: 120 (bil) Goal status: MET   LONG TERM GOALS: Target date: 09/16/24  Patient will improve average PSFS score by at least 2 points to signify clinically meaningful improvement in functional abilities.  Baseline: see above Goal  status: MET 8/19  2.  Patient will report at least a 50% improvement in neck/knee pain to reduce current functional limitations.  Baseline: 0% improvement Goal status: INITIAL  3.  Patient will demonstrate at least 4+/5 bilateral hip abductor/extensor strength to improve stability about the chain with walking activity.  Baseline: see above  Goal status: INITIAL  4.  Patient will demonstrate at least 4+/5 bilateral middle trap strength to improve postural stability.  Baseline: see above Goal status: INITIAL  5.  Patient will be independent with advanced home program to progress/maintain current level of function.  Baseline: initial HEP issued  Goal status: INITIAL    PLAN:  PT FREQUENCY: 2x/week  PT DURATION: 8 weeks  PLANNED INTERVENTIONS: [id number]- PT Re-evaluation, 97750- Physical Performance Testing, 97110-Therapeutic exercises, 97530- Therapeutic activity, [id number]- Neuromuscular re-education, 97535- Self Care, [id number]- Manual therapy, [id number]- Gait training, [phone] (1-2 muscles), [id number] (3+ muscles)- Dry Needling, Cryotherapy, and Moist heat  PLAN FOR NEXT SESSION: focus on hip/knee/ankle strength to walk uphill with decreased pain  Lamarr Price, PTA 08/14/24, 9:41 AM

## 2024-08-15 ENCOUNTER — Encounter: Payer: Self-pay | Admitting: Physical Therapy

## 2024-08-15 ENCOUNTER — Ambulatory Visit: Admitting: Physical Therapy

## 2024-08-15 DIAGNOSIS — M6281 Muscle weakness (generalized): Secondary | ICD-10-CM

## 2024-08-15 DIAGNOSIS — M542 Cervicalgia: Secondary | ICD-10-CM

## 2024-08-15 DIAGNOSIS — G8929 Other chronic pain: Secondary | ICD-10-CM

## 2024-08-15 DIAGNOSIS — M25561 Pain in right knee: Secondary | ICD-10-CM | POA: Diagnosis not present

## 2024-08-22 ENCOUNTER — Encounter: Payer: Self-pay | Admitting: Physical Therapy

## 2024-08-22 ENCOUNTER — Ambulatory Visit: Attending: Sports Medicine | Admitting: Physical Therapy

## 2024-08-22 ENCOUNTER — Encounter: Payer: Self-pay | Admitting: Sports Medicine

## 2024-08-22 DIAGNOSIS — M25561 Pain in right knee: Secondary | ICD-10-CM | POA: Insufficient documentation

## 2024-08-22 DIAGNOSIS — G8929 Other chronic pain: Secondary | ICD-10-CM | POA: Insufficient documentation

## 2024-08-22 DIAGNOSIS — M542 Cervicalgia: Secondary | ICD-10-CM | POA: Diagnosis present

## 2024-08-22 DIAGNOSIS — M25562 Pain in left knee: Secondary | ICD-10-CM | POA: Diagnosis present

## 2024-08-22 DIAGNOSIS — M6281 Muscle weakness (generalized): Secondary | ICD-10-CM | POA: Diagnosis present

## 2024-08-22 NOTE — Therapy (Signed)
 [facility]   Patient Name: Brandon Mckee MRN: [id number] DOB:03-19-1966, 58 y.o., male Today's Date: 08/22/2024  END OF SESSION:  PT End of Session - 08/22/24 1611     Visit Number 10    Number of Visits 17    Date for PT Re-Evaluation 09/16/24    Authorization Type [employer] MCD    Authorization - Visit Number 10    Authorization - Number of Visits 27    PT Start Time 1530    PT Stop Time 1611    PT Time Calculation (min) 41 min    Activity Tolerance Patient tolerated treatment well    Behavior During Therapy WFL for tasks assessed/performed             Past Medical History:  Diagnosis Date   Hyperlipidemia    Hypertension    Metabolic syndrome    IFG   Pes anserine bursitis    bilateral   Past Surgical History:  Procedure Laterality Date   APPENDECTOMY     Patient Active Problem List   Diagnosis Date Noted   Cervical spondylosis 07/17/2024   History of CVA (cerebrovascular accident) 12/06/2023   Thrombocytopenia (HCC) 12/06/2023   Hypokalemia 12/06/2023   Uncontrolled type 2 diabetes mellitus with hyperglycemia (HCC) 12/06/2023   Poorly-controlled hypertension 12/06/2023   Type 2 diabetes mellitus with hyperglycemia, without long-term current use of insulin (HCC) 12/06/2023   Hemiparesis of right dominant side due to acute cerebrovascular disease (HCC) 12/06/2023   Allergic reaction 07/16/2020   Gastroesophageal reflux disease with esophagitis 05/13/2018   Chronic gout of multiple sites 05/13/2018   Obesity (BMI 30-39.9) 09/10/2017   Grief reaction 08/09/2017   Benign prostatic hyperplasia without lower urinary tract symptoms 04/25/2017   Colon polyps 01/01/2017   Diverticulosis of colon without hemorrhage 01/01/2017   Trapezius muscle spasm 11/24/2016   Hypogonadism in male 07/03/2016   Microalbuminuria 04/01/2016   Osteoarthritis of right acromioclavicular joint 01/17/2016   Dyslipidemia 01/07/2016   Vitamin D   deficiency 01/07/2016   Depression 01/03/2016   Anxiety disorder 01/03/2016   Essential hypertension 01/03/2016   Pre-diabetes 12/07/2014   Male hypogonadism 05/25/2014   Obstructive sleep apnea 10/11/2013   B12 DEFICIENCY 06/30/2010   LEG EDEMA 05/03/2009   Primary osteoarthritis of both knees 12/27/2008   FATIGUE 09/25/2008   DYSMETABOLIC SYNDROME 08/04/2007   Hyperlipidemia LDL goal <70 12/08/2006   Class 2 severe obesity due to excess calories with serious comorbidity and body mass index (BMI) of 35.0 to 35.9 in adult (HCC) 12/08/2006    PCP: Antoniette Vermell CROME, PA-C  REFERRING PROVIDER: Curtis Debby PARAS, MD   REFERRING DIAG: M17.0 (ICD-10-CM) - Primary osteoarthritis of both knees; cervical spondylosis   THERAPY DIAG:  Chronic pain of both knees  Cervicalgia  Muscle weakness (generalized)  Rationale for Evaluation and Treatment: Rehabilitation  ONSET DATE: chronic   SUBJECTIVE:   SUBJECTIVE STATEMENT: 08/22/2024: Pt states he had to pick up extra shifts so he has not been doing as much walking. He states his knees have not bothered him at work   PERTINENT HISTORY: CVA with Rt-sided hemiparesis  Obesity   Patient reports since 2008 he has been receiving knee injections for his pain. Then due to lack of insurance he went a few years without injections. Latest injection was in 2018. He feels that his knee pain is worsening and he can't even walk up hills. No popping/clicking in the knees. No instability. The pain is along the medial  aspect of bilateral knees.   Patient reports off/on neck pain, but more recently the pain has been more constant since CVA in December 2024. He reports the pain has been ongoing for at least a year. He attributes his pain to repetitive use of his upper body/previous work activity. He denies any numbness/tingling. No radicular symptoms.   PAIN:  Are you having pain? Yes: NPRS scale: 0 currently, at worst 7 (KNEES); 0 currently, at worst  4 (NECK) Pain location: bilateral medial knees and posterior neck Pain description: throbbing (knees); tight (neck) Aggravating factors: walking up hills, carrying items(knees); neck movement, sleep (neck) Relieving factors: topical analgesic   PRECAUTIONS: None  RED FLAGS: None   WEIGHT BEARING RESTRICTIONS: No  FALLS:  Has patient fallen in last 6 months? No  LIVING ENVIRONMENT: Lives with: lives with their daughter Lives in: House/apartment Stairs: No Has following equipment at home: [name] - 2 wheeled, shower chair, and bed side commode  OCCUPATION: part time- works at [employer]  PLOF: Independent  PATIENT GOALS: relieve a little bit of the pain.   NEXT MD VISIT: nothing scheduled   OBJECTIVE:  Note: Objective measures were completed at Evaluation unless otherwise noted.  DIAGNOSTIC FINDINGS: X-rays pending   PATIENT SURVEYS:  Patient-specific activity scoring scheme (Point to one number):  0 represents "unable to perform." 10 represents "able to perform at prior level. 0 1 2 3 4 5 6 7 8 9  10 (Date and Score) Activity Initial  Activity Eval   08/08/24  Walking uphill   5  5  Carrying items   5  8  Sleep  4 8   Additional Additional Total score = sum of the activity scores/number of activities Minimum detectable change (90%CI) for average score = 2 points Minimum detectable change (90%CI) for single activity score = 3 points PSFS developed by: Rosalee MYRTIS Marvis KYM Charlet CHRISTELLA., & Binkley, J. (1995). Assessing disability and change on individual  patients: a report of a patient specific measure. Physiotherapy [place], 47, [phone]. Reproduced with the permission of the authors  Score: 4.6   COGNITION: Overall cognitive status: Within functional limits for tasks assessed     SENSATION: Not tested  EDEMA:  No obvious swelling about the knees   MUSCLE LENGTH: Hamstrings: Right lacking 40 deg; Left lacking 35  deg   POSTURE: rounded shoulders and forward head, tibial bowing     LOWER EXTREMITY ROM:  Active ROM Right eval Left eval Right 07/31/24 Left 07/31/24  Hip flexion      Hip extension      Hip abduction      Hip adduction      Hip internal rotation      Hip external rotation      Knee flexion 115 115 123 120  Knee extension WNL WNL    Ankle dorsiflexion      Ankle plantarflexion      Ankle inversion      Ankle eversion       (Blank rows = not tested)  LOWER EXTREMITY MMT:  MMT Right eval Left eval  Hip flexion 4+ 5  Hip extension 4- 4-  Hip abduction 4- 4-  Hip adduction    Hip internal rotation    Hip external rotation    Knee flexion 4+ 5  Knee extension 4+ 5  Ankle dorsiflexion    Ankle plantarflexion    Ankle inversion    Ankle eversion     (Blank rows =  not tested) CERVICAL ROM:   Active ROM A/PROM (deg) eval 08/07/24  Flexion 40   Extension 20   Right lateral flexion    Left lateral flexion    Right rotation 45 55  Left rotation 35 50   (Blank rows = not tested) UPPER EXTREMITY MMT:  MMT Right eval Left eval  Shoulder flexion 5 upper trap compensation 5 upper trap compensation  Shoulder extension    Shoulder abduction 5 5  Shoulder adduction    Shoulder extension    Shoulder internal rotation 5 5  Shoulder external rotation 5 5  Middle trapezius 4- 4-  Lower trapezius    Elbow flexion    Elbow extension    Wrist flexion    Wrist extension    Wrist ulnar deviation    Wrist radial deviation    Wrist pronation    Wrist supination    Grip strength     (Blank rows = not tested)  SPECIAL TESTS: (+) Ely's bilaterally   FUNCTIONAL TESTS:  Not tested   GAIT: Distance walked: 20 ft  Assistive device utilized: None Level of assistance: Complete Independence Comments: decreased Rt arm swing   [facility] Adult PT Treatment:                                                DATE: 08/22/24 Therapeutic Exercise: Seated HS stretch Seated hip  flexor stretch  Neuromuscular re-ed: Standing Hip abd blue TB 2 x 10 Hip ext blue TB 2 x 10 HS curl blue TB 2 x 10 SL heel raise 2 x 10 Lunge with cuing for technique x 8 bilat Therapeutic Activity: Nustep L7 x 5 min for LE strength and warm up Stair negotiation without handrail x 2 flights without increase in pain Walking outdoors on incline without increase in pain! Pt does c/o tightness with walking uphill Step ups 8'' with 10# farmers carry Straddle step ups 8'' holding 10# KB Strong band suitcase carry 10# 4 x 40' fwd, 4 x 40' bkwd Sled push/pull 0#-->50#   [facility] Adult PT Treatment:                                                DATE: 08/15/24  Neuromuscular re-ed: 15# front carry + high march 4x66ft  Strong band suitcase carry 10# 5x2ft BIL  Reactive stop/go 6 laps (3 CW/3CCW) for deceleration work   Therapeutic Activity: Nu step LE/UE L6  Farmer's carry 10# BIL 3 laps around gym Farmer's carry retro walk 4 x60ft 10# BIL cues for posture  Fwd lunge 2x8 BIL w/ UE support PRN; cues for comfortable ROM and mechanics     [facility] Adult PT Treatment:                                                DATE: 08/14/2024 Therapeutic Exercise: NuStep L6 x 5 min  Standing HS & gastroc stretches at stairs Seated HS stretch with foot propped on 8 box Seated hip flexor stretch x30 each leg Neuromuscular re-ed: Standing: Hip abd + blue TB x20 (bil) Hip extension +  blue TB x15 (bil) HS curls + green TB  2x10 (bil) Seated LAQ + 5#AW x 20 (bil) Therapeutic Activity: Resisted fwd/bkwd walking + 25# on matrix x10 each way Heel raises off 4 step 2x10 Squat resisted side step + black TB around thighs Marching + 5#AW 4x10' Butt kickers + 5#AW 4x10' Step up 8 box leading with Lt LE + 10#KB farmer carry Walking around building + up/down long stairway with 10#KB farmer carry   [facility] Adult PT Treatment:                                                DATE: 08/08/24 Therapeutic  Exercise: Heel raise off step x 10 Gastroc stretch on slant board 2 x 30 sec Hip abd 5# (matrix) x 15 Hip ext 10# (matrix) x 20 Hip add 5# (matrix) x 15  Therapeutic Activity: Squat to overhead lift 15# KB x 10 Simulated bowling with 10# KB with 3 step approach Wall push up wide/narrow x 10 each Gait: Walking downhill/uphill outdoors - pt with increased knee pain to 2/10 with walking uphill                                                                                                                               PATIENT EDUCATION:  Education details: see treatment; POC  Person educated: Patient Education method: Explanation, Demonstration, Tactile cues, Verbal cues, and Handouts Education comprehension: verbalized understanding, returned demonstration, verbal cues required, tactile cues required, and needs further education  HOME EXERCISE PROGRAM: Access Code: [email or web address] URL: [email or web address] Date: 07/26/2024 Prepared by: Darice Conine  Exercises - Gastroc Stretch on Wall  - 1 x daily - 7 x weekly - 3 sets - 30 sec  hold - Seated Hamstring Stretch  - 1 x daily - 7 x weekly - 3 sets - 30 sec  hold - Seated Cervical Rotation AROM  - 1 x daily - 7 x weekly - 1 sets - 10 reps - 10 seconds hold - Supine Cervical Retraction with Towel  - 1 x daily - 7 x weekly - 2 sets - 10 reps - Squat with Counter Support  - 1 x daily - 7 x weekly - 2 sets - 10 reps - Standing Heel Raise  - 1 x daily - 7 x weekly - 2 sets - 10 reps - Wall Push Up  - 1 x daily - 7 x weekly - 2 sets - 10 reps  ASSESSMENT:  CLINICAL IMPRESSION: 08/22/2024: pt able to walk uphill during session without pain! He does have a twinge of pain with weighted sled push/pull but overall much improved strength and activity tolerance. Progressing well towards goals    OBJECTIVE IMPAIRMENTS: Abnormal gait, decreased activity tolerance, decreased endurance, decreased knowledge of condition, difficulty  walking, decreased ROM, decreased strength, impaired  flexibility, impaired UE functional use, improper body mechanics, postural dysfunction, and pain.     GOALS: Goals reviewed with patient? Yes  SHORT TERM GOALS: Target date: 08/15/2024  Patient will be independent and compliant with initial HEP.  Baseline: initial HEP issued Goal status: MET  2.  Patient will demonstrate at least 50 degrees of cervical rotation AROM to improve ability to scan environment when walking.  Baseline: see above 08/07/24: (Rt) 55, (Lt) 50  Goal status: MET  3.  Patient will demonstrate at least 120 degrees of bilateral knee flexion AROM to improve ease of transfers.  Baseline: see above  07/31/24: 120 (bil) Goal status: MET   LONG TERM GOALS: Target date: 09/16/24  Patient will improve average PSFS score by at least 2 points to signify clinically meaningful improvement in functional abilities.  Baseline: see above Goal status: MET 8/19  2.  Patient will report at least a 50% improvement in neck/knee pain to reduce current functional limitations.  Baseline: 0% improvement Goal status: INITIAL  3.  Patient will demonstrate at least 4+/5 bilateral hip abductor/extensor strength to improve stability about the chain with walking activity.  Baseline: see above  Goal status: INITIAL  4.  Patient will demonstrate at least 4+/5 bilateral middle trap strength to improve postural stability.  Baseline: see above Goal status: INITIAL  5.  Patient will be independent with advanced home program to progress/maintain current level of function.  Baseline: initial HEP issued  Goal status: INITIAL    PLAN:  PT FREQUENCY: 2x/week  PT DURATION: 8 weeks  PLANNED INTERVENTIONS: [id number]- PT Re-evaluation, 97750- Physical Performance Testing, 97110-Therapeutic exercises, 97530- Therapeutic activity, [id number]- Neuromuscular re-education, 97535- Self Care, [id number]- Manual therapy, [id number]- Gait training, [phone] (1-2 muscles),  [id number] (3+ muscles)- Dry Needling, Cryotherapy, and Moist heat  PLAN FOR NEXT SESSION: focus on hip/knee/ankle strength to walk uphill with decreased pain  Darice Conine, PT,DPT09/02/254:11 PM

## 2024-08-23 ENCOUNTER — Ambulatory Visit

## 2024-08-23 DIAGNOSIS — M6281 Muscle weakness (generalized): Secondary | ICD-10-CM

## 2024-08-23 DIAGNOSIS — M542 Cervicalgia: Secondary | ICD-10-CM

## 2024-08-23 DIAGNOSIS — G8929 Other chronic pain: Secondary | ICD-10-CM

## 2024-08-23 DIAGNOSIS — M25561 Pain in right knee: Secondary | ICD-10-CM | POA: Diagnosis not present

## 2024-08-23 NOTE — Therapy (Signed)
 [facility]   Patient Name: Brandon Mckee MRN: [id number] DOB:[birth year], 58 y.o., male Today's Date: 08/23/2024  END OF SESSION:  PT End of Session - 08/23/24 1533     Visit Number 11    Number of Visits 17    Date for PT Re-Evaluation 09/16/24    Authorization Type [employer] MCD    PT Start Time 1535    PT Stop Time 1615    PT Time Calculation (min) 40 min    Activity Tolerance Patient tolerated treatment well    Behavior During Therapy WFL for tasks assessed/performed         Past Medical History:  Diagnosis Date   Hyperlipidemia    Hypertension    Metabolic syndrome    IFG   Pes anserine bursitis    bilateral   Past Surgical History:  Procedure Laterality Date   APPENDECTOMY     Patient Active Problem List   Diagnosis Date Noted   Cervical spondylosis 07/17/2024   History of CVA (cerebrovascular accident) 12/06/2023   Thrombocytopenia (HCC) 12/06/2023   Hypokalemia 12/06/2023   Uncontrolled type 2 diabetes mellitus with hyperglycemia (HCC) 12/06/2023   Poorly-controlled hypertension 12/06/2023   Type 2 diabetes mellitus with hyperglycemia, without long-term current use of insulin (HCC) 12/06/2023   Hemiparesis of right dominant side due to acute cerebrovascular disease (HCC) 12/06/2023   Allergic reaction 07/16/2020   Gastroesophageal reflux disease with esophagitis 05/13/2018   Chronic gout of multiple sites 05/13/2018   Obesity (BMI 30-39.9) 09/10/2017   Grief reaction 08/09/2017   Benign prostatic hyperplasia without lower urinary tract symptoms 04/25/2017   Colon polyps 01/01/2017   Diverticulosis of colon without hemorrhage 01/01/2017   Trapezius muscle spasm 11/24/2016   Hypogonadism in male 07/03/2016   Microalbuminuria 04/01/2016   Osteoarthritis of right acromioclavicular joint 01/17/2016   Dyslipidemia 01/07/2016   Vitamin D  deficiency 01/07/2016   Depression 01/03/2016   Anxiety disorder 01/03/2016   Essential  hypertension 01/03/2016   Pre-diabetes 12/07/2014   Male hypogonadism 05/25/2014   Obstructive sleep apnea 10/11/2013   B12 DEFICIENCY 06/30/2010   LEG EDEMA 05/03/2009   Primary osteoarthritis of both knees 12/27/2008   FATIGUE 09/25/2008   DYSMETABOLIC SYNDROME 08/04/2007   Hyperlipidemia LDL goal <70 12/08/2006   Class 2 severe obesity due to excess calories with serious comorbidity and body mass index (BMI) of 35.0 to 35.9 in adult (HCC) 12/08/2006    PCP: Antoniette Vermell CROME, PA-C  REFERRING PROVIDER: Curtis Debby PARAS, MD   REFERRING DIAG: M17.0 (ICD-10-CM) - Primary osteoarthritis of both knees; cervical spondylosis   THERAPY DIAG:  Chronic pain of both knees  Cervicalgia  Muscle weakness (generalized)  Rationale for Evaluation and Treatment: Rehabilitation  ONSET DATE: chronic   SUBJECTIVE:   SUBJECTIVE STATEMENT: Patient reports he has not had any pain in knees when walking uphill or up/down stairs but continues to have tightness in Lt knee; states if he walks up a very steep hill has 5/10 pain in Lt knee. Patient continues to have no neck pain.   PERTINENT HISTORY: CVA with Rt-sided hemiparesis  Obesity   Patient reports since 2008 he has been receiving knee injections for his pain. Then due to lack of insurance he went a few years without injections. Latest injection was in 2018. He feels that his knee pain is worsening and he can't even walk up hills. No popping/clicking in the knees. No instability. The pain is along the medial aspect of bilateral knees.  Patient reports off/on neck pain, but more recently the pain has been more constant since CVA in December 2024. He reports the pain has been ongoing for at least a year. He attributes his pain to repetitive use of his upper body/previous work activity. He denies any numbness/tingling. No radicular symptoms.   PAIN:  Are you having pain? Yes: NPRS scale: 0 currently, at worst 7 (KNEES); 0 currently, at  worst 4 (NECK) Pain location: bilateral medial knees and posterior neck Pain description: throbbing (knees); tight (neck) Aggravating factors: walking up hills, carrying items(knees); neck movement, sleep (neck) Relieving factors: topical analgesic   PRECAUTIONS: None  RED FLAGS: None   WEIGHT BEARING RESTRICTIONS: No  FALLS:  Has patient fallen in last 6 months? No  LIVING ENVIRONMENT: Lives with: lives with their daughter Lives in: House/apartment Stairs: No Has following equipment at home: [name] - 2 wheeled, shower chair, and bed side commode  OCCUPATION: part time- works at [employer]  PLOF: Independent  PATIENT GOALS: relieve a little bit of the pain.   NEXT MD VISIT: 08/28/24  OBJECTIVE:  Note: Objective measures were completed at Evaluation unless otherwise noted.  DIAGNOSTIC FINDINGS: X-rays pending   PATIENT SURVEYS:  Patient-specific activity scoring scheme (Point to one number):  0 represents "unable to perform." 10 represents "able to perform at prior level. 0 1 2 3 4 5 6 7 8 9  10 (Date and Score) Activity Initial  Activity Eval   08/08/24  Walking uphill   5  5  Carrying items   5  8  Sleep  4 8   Additional Additional Total score = sum of the activity scores/number of activities Minimum detectable change (90%CI) for average score = 2 points Minimum detectable change (90%CI) for single activity score = 3 points PSFS developed by: Rosalee MYRTIS Marvis KYM Charlet CHRISTELLA., & Binkley, J. (1995). Assessing disability and change on individual  patients: a report of a patient specific measure. Physiotherapy [place], 47, [phone]. Reproduced with the permission of the authors  Score: 4.6   COGNITION: Overall cognitive status: Within functional limits for tasks assessed     SENSATION: Not tested  EDEMA:  No obvious swelling about the knees   MUSCLE LENGTH: Hamstrings: Right lacking 40 deg; Left lacking 35 deg   POSTURE:  rounded shoulders and forward head, tibial bowing     LOWER EXTREMITY ROM:  Active ROM Right eval Left eval Right 07/31/24 Left 07/31/24  Hip flexion      Hip extension      Hip abduction      Hip adduction      Hip internal rotation      Hip external rotation      Knee flexion 115 115 123 120  Knee extension WNL WNL    Ankle dorsiflexion      Ankle plantarflexion      Ankle inversion      Ankle eversion       (Blank rows = not tested)  LOWER EXTREMITY MMT:  MMT Right eval Left eval Right 08/23/24 Left 08/23/24  Hip flexion 4+ 5 5 4+  Hip extension 4- 4- 4+ 4+  Hip abduction 4- 4- 5 5  Hip adduction      Hip internal rotation      Hip external rotation      Knee flexion 4+ 5 5 5   Knee extension 4+ 5 5 5   Ankle dorsiflexion      Ankle plantarflexion  Ankle inversion      Ankle eversion       (Blank rows = not tested) CERVICAL ROM:   Active ROM A/PROM (deg) eval 08/07/24  Flexion 40   Extension 20   Right lateral flexion    Left lateral flexion    Right rotation 45 55  Left rotation 35 50   (Blank rows = not tested) UPPER EXTREMITY MMT:  MMT Right eval Left eval Rt / Lt 08/23/24  Shoulder flexion 5 upper trap compensation 5 upper trap compensation   Shoulder extension     Shoulder abduction 5 5   Shoulder adduction     Shoulder extension     Shoulder internal rotation 5 5   Shoulder external rotation 5 5   Middle trapezius 4- 4- 4 / 4  Lower trapezius     Elbow flexion     Elbow extension     Wrist flexion     Wrist extension     Wrist ulnar deviation     Wrist radial deviation     Wrist pronation     Wrist supination     Grip strength      (Blank rows = not tested)  SPECIAL TESTS: (+) Ely's bilaterally   FUNCTIONAL TESTS:  Not tested   GAIT: Distance walked: 20 ft  Assistive device utilized: None Level of assistance: Complete Independence Comments: decreased Rt arm swing   [facility] Adult PT Treatment:                                                 DATE: 08/23/2024 Therapeutic Exercise: NuStep L7 x 5 min Standing (Rt) quad stretch Side lunge adductor stretch at counter Supine adductor stretch (butterfly) 2x30  Hip & middle trap MMT (see above) Neuromuscular re-ed: Seated LAQ + black TB anchored around table leg 3x10 Standing:  Resisted shoulder horizontal abduction in lunge + green TB x15 Resisted W arms + green TB x 15 Resisted shoulder abduction + green TB x15 (bil) Therapeutic Activity: Lateral heel taps down 4 --> 6 step (Lt) Front heel tap down 4 step (Lt) Ascend/descend long stairway    [facility] Adult PT Treatment:                                                DATE: 08/22/24 Therapeutic Exercise: Seated HS stretch Seated hip flexor stretch  Neuromuscular re-ed: Standing Hip abd blue TB 2 x 10 Hip ext blue TB 2 x 10 HS curl blue TB 2 x 10 SL heel raise 2 x 10 Lunge with cuing for technique x 8 bilat Therapeutic Activity: Nustep L7 x 5 min for LE strength and warm up Stair negotiation without handrail x 2 flights without increase in pain Walking outdoors on incline without increase in pain! Pt does c/o tightness with walking uphill Step ups 8'' with 10# farmers carry Straddle step ups 8'' holding 10# KB Strong band suitcase carry 10# 4 x 40' fwd, 4 x 40' bkwd Sled push/pull 0#-->50#   [facility] Adult PT Treatment:  DATE: 08/15/24 Neuromuscular re-ed: 15# front carry + high march 4x60ft  Strong band suitcase carry 10# 5x31ft BIL  Reactive stop/go 6 laps (3 CW/3CCW) for deceleration work   Therapeutic Activity: Nu step LE/UE L6  Farmer's carry 10# BIL 3 laps around gym Farmer's carry retro walk 4 x68ft 10# BIL cues for posture  Fwd lunge 2x8 BIL w/ UE support PRN; cues for comfortable ROM and mechanics                                                                                                                               PATIENT EDUCATION:   Education details: see treatment; POC  Person educated: Patient Education method: Explanation, Demonstration, Tactile cues, Verbal cues, and Handouts Education comprehension: verbalized understanding, returned demonstration, verbal cues required, tactile cues required, and needs further education  HOME EXERCISE PROGRAM: Access Code: [email or web address] URL: [email or web address] Date: 07/26/2024 Prepared by: Darice Conine  Exercises - Gastroc Stretch on Wall  - 1 x daily - 7 x weekly - 3 sets - 30 sec  hold - Seated Hamstring Stretch  - 1 x daily - 7 x weekly - 3 sets - 30 sec  hold - Seated Cervical Rotation AROM  - 1 x daily - 7 x weekly - 1 sets - 10 reps - 10 seconds hold - Supine Cervical Retraction with Towel  - 1 x daily - 7 x weekly - 2 sets - 10 reps - Squat with Counter Support  - 1 x daily - 7 x weekly - 2 sets - 10 reps - Standing Heel Raise  - 1 x daily - 7 x weekly - 2 sets - 10 reps - Wall Push Up  - 1 x daily - 7 x weekly - 2 sets - 10 reps  ASSESSMENT:  CLINICAL IMPRESSION:  Patient has met 4/5 LTGs; patient is progressing towards middle trap strength goal. Incorporated postural strengthening exercises during second half of treatment session to progress strength goal. Intermittent mild pain/discomfort in Lt medial knee during stair navigation.   OBJECTIVE IMPAIRMENTS: Abnormal gait, decreased activity tolerance, decreased endurance, decreased knowledge of condition, difficulty walking, decreased ROM, decreased strength, impaired flexibility, impaired UE functional use, improper body mechanics, postural dysfunction, and pain.     GOALS: Goals reviewed with patient? Yes  SHORT TERM GOALS: Target date: 08/15/2024  Patient will be independent and compliant with initial HEP.  Baseline: initial HEP issued Goal status: MET  2.  Patient will demonstrate at least 50 degrees of cervical rotation AROM to improve ability to scan environment when walking.  Baseline:  see above 08/07/24: (Rt) 55, (Lt) 50  Goal status: MET  3.  Patient will demonstrate at least 120 degrees of bilateral knee flexion AROM to improve ease of transfers.  Baseline: see above  07/31/24: 120 (bil) Goal status: MET   LONG TERM GOALS: Target date: 09/16/24  Patient will improve average PSFS score by at least 2 points  to signify clinically meaningful improvement in functional abilities.  Baseline: see above Goal status: MET 8/19  2.  Patient will report at least a 50% improvement in neck/knee pain to reduce current functional limitations.  Baseline: 0% improvement 08/23/24: 100% neck, 75% knee improvement Goal status: MET  3.  Patient will demonstrate at least 4+/5 bilateral hip abductor/extensor strength to improve stability about the chain with walking activity.  Baseline: see above  08/23/24: see above Goal status: MET  4.  Patient will demonstrate at least 4+/5 bilateral middle trap strength to improve postural stability.  Baseline: see above 08/23/24: 4/5 Goal status: IN PROGRESS  5.  Patient will be independent with advanced home program to progress/maintain current level of function.  Baseline: initial HEP issued  Goal status: MET    PLAN:  PT FREQUENCY: 2x/week  PT DURATION: 8 weeks  PLANNED INTERVENTIONS: 97164- PT Re-evaluation, 97750- Physical Performance Testing, 97110-Therapeutic exercises, 97530- Therapeutic activity, [id number]- Neuromuscular re-education, 97535- Self Care, [id number]- Manual therapy, [id number]- Gait training, [phone] (1-2 muscles), [id number] (3+ muscles)- Dry Needling, Cryotherapy, and Moist heat  PLAN FOR NEXT SESSION: Middle trap strength; finalize HEP; functional knee strengthening  Lamarr Price, PTA 08/23/24, 4:21 PM

## 2024-08-28 ENCOUNTER — Ambulatory Visit: Admitting: Physician Assistant

## 2024-08-28 ENCOUNTER — Ambulatory Visit

## 2024-08-28 VITALS — BP 131/70 | HR 90 | Ht 70.0 in | Wt 240.0 lb

## 2024-08-28 DIAGNOSIS — I10 Essential (primary) hypertension: Secondary | ICD-10-CM | POA: Diagnosis not present

## 2024-08-28 DIAGNOSIS — M6281 Muscle weakness (generalized): Secondary | ICD-10-CM

## 2024-08-28 DIAGNOSIS — M170 Bilateral primary osteoarthritis of knee: Secondary | ICD-10-CM

## 2024-08-28 DIAGNOSIS — E785 Hyperlipidemia, unspecified: Secondary | ICD-10-CM

## 2024-08-28 DIAGNOSIS — Z7984 Long term (current) use of oral hypoglycemic drugs: Secondary | ICD-10-CM | POA: Diagnosis not present

## 2024-08-28 DIAGNOSIS — E1169 Type 2 diabetes mellitus with other specified complication: Secondary | ICD-10-CM

## 2024-08-28 DIAGNOSIS — E1165 Type 2 diabetes mellitus with hyperglycemia: Secondary | ICD-10-CM

## 2024-08-28 DIAGNOSIS — Z23 Encounter for immunization: Secondary | ICD-10-CM | POA: Diagnosis not present

## 2024-08-28 DIAGNOSIS — M25561 Pain in right knee: Secondary | ICD-10-CM | POA: Diagnosis not present

## 2024-08-28 DIAGNOSIS — M542 Cervicalgia: Secondary | ICD-10-CM

## 2024-08-28 DIAGNOSIS — G8929 Other chronic pain: Secondary | ICD-10-CM

## 2024-08-28 MED ORDER — MOUNJARO 7.5 MG/0.5ML ~~LOC~~ SOAJ: 7.5000 mg | SUBCUTANEOUS | 0 refills | Status: DC

## 2024-08-28 MED ORDER — METFORMIN HCL 1000 MG PO TABS
ORAL_TABLET | ORAL | 1 refills | Status: AC
Start: 2024-08-28 — End: ?

## 2024-08-28 MED ORDER — VALSARTAN-HYDROCHLOROTHIAZIDE 320-25 MG PO TABS: 1.0000 | ORAL_TABLET | Freq: Every day | ORAL | 3 refills | Status: AC

## 2024-08-28 MED ORDER — AMLODIPINE BESYLATE 10 MG PO TABS: 10.0000 mg | ORAL_TABLET | Freq: Every day | ORAL | 3 refills | Status: AC

## 2024-08-28 MED ORDER — HYDRALAZINE HCL 50 MG PO TABS: 50.0000 mg | ORAL_TABLET | Freq: Three times a day (TID) | ORAL | 3 refills | Status: AC

## 2024-08-28 NOTE — Therapy (Signed)
 [facility]   Patient Name: Brandon Mckee MRN: [id number] DOB:[birth year], 58 y.o., male Today's Date: 08/28/2024  END OF SESSION:  PT End of Session - 08/28/24 1526     Visit Number 12    Number of Visits 17    Date for PT Re-Evaluation 09/16/24    Authorization Type [employer] MCD    Authorization - Visit Number 12    Authorization - Number of Visits 27    PT Start Time 1527    PT Stop Time 1607    PT Time Calculation (min) 40 min    Activity Tolerance Patient tolerated treatment well    Behavior During Therapy WFL for tasks assessed/performed         Past Medical History:  Diagnosis Date   Hyperlipidemia    Hypertension    Metabolic syndrome    IFG   Pes anserine bursitis    bilateral   Past Surgical History:  Procedure Laterality Date   APPENDECTOMY     Patient Active Problem List   Diagnosis Date Noted   Cervical spondylosis 07/17/2024   History of CVA (cerebrovascular accident) 12/06/2023   Thrombocytopenia (HCC) 12/06/2023   Hypokalemia 12/06/2023   Uncontrolled type 2 diabetes mellitus with hyperglycemia (HCC) 12/06/2023   Poorly-controlled hypertension 12/06/2023   Type 2 diabetes mellitus with hyperglycemia, without long-term current use of insulin (HCC) 12/06/2023   Hemiparesis of right dominant side due to acute cerebrovascular disease (HCC) 12/06/2023   Allergic reaction 07/16/2020   Gastroesophageal reflux disease with esophagitis 05/13/2018   Chronic gout of multiple sites 05/13/2018   Obesity (BMI 30-39.9) 09/10/2017   Grief reaction 08/09/2017   Benign prostatic hyperplasia without lower urinary tract symptoms 04/25/2017   Colon polyps 01/01/2017   Diverticulosis of colon without hemorrhage 01/01/2017   Trapezius muscle spasm 11/24/2016   Hypogonadism in male 07/03/2016   Microalbuminuria 04/01/2016   Osteoarthritis of right acromioclavicular joint 01/17/2016   Dyslipidemia 01/07/2016   Vitamin D  deficiency  01/07/2016   Depression 01/03/2016   Anxiety disorder 01/03/2016   Essential hypertension 01/03/2016   Pre-diabetes 12/07/2014   Male hypogonadism 05/25/2014   Obstructive sleep apnea 10/11/2013   B12 DEFICIENCY 06/30/2010   LEG EDEMA 05/03/2009   Primary osteoarthritis of both knees 12/27/2008   FATIGUE 09/25/2008   DYSMETABOLIC SYNDROME 08/04/2007   Hyperlipidemia LDL goal <70 12/08/2006   Class 2 severe obesity due to excess calories with serious comorbidity and body mass index (BMI) of 35.0 to 35.9 in adult [facility]) 12/08/2006    PCP: Antoniette Vermell CROME, PA-C  REFERRING PROVIDER: Curtis Debby PARAS, MD   REFERRING DIAG: M17.0 (ICD-10-CM) - Primary osteoarthritis of both knees; cervical spondylosis   THERAPY DIAG:  Chronic pain of both knees  Cervicalgia  Muscle weakness (generalized)  Rationale for Evaluation and Treatment: Rehabilitation  ONSET DATE: chronic   SUBJECTIVE:   SUBJECTIVE STATEMENT: Patient his knees are feeling pretty good today, states he only had to go up the stairs once at work.   PERTINENT HISTORY: CVA with Rt-sided hemiparesis  Obesity   Patient reports since 2008 he has been receiving knee injections for his pain. Then due to lack of insurance he went a few years without injections. Latest injection was in 2018. He feels that his knee pain is worsening and he can't even walk up hills. No popping/clicking in the knees. No instability. The pain is along the medial aspect of bilateral knees.   Patient reports off/on neck pain, but more  recently the pain has been more constant since CVA in December 2024. He reports the pain has been ongoing for at least a year. He attributes his pain to repetitive use of his upper body/previous work activity. He denies any numbness/tingling. No radicular symptoms.   PAIN:  Are you having pain? Yes: NPRS scale: 0 currently, at worst 7 (KNEES); 0 currently, at worst 4 (NECK) Pain location: bilateral medial knees and  posterior neck Pain description: throbbing (knees); tight (neck) Aggravating factors: walking up hills, carrying items(knees); neck movement, sleep (neck) Relieving factors: topical analgesic   PRECAUTIONS: None  RED FLAGS: None   WEIGHT BEARING RESTRICTIONS: No  FALLS:  Has patient fallen in last 6 months? No  LIVING ENVIRONMENT: Lives with: lives with their daughter Lives in: House/apartment Stairs: No Has following equipment at home: [name] - 2 wheeled, shower chair, and bed side commode  OCCUPATION: part time- works at [employer]  PLOF: Independent  PATIENT GOALS: relieve a little bit of the pain.   NEXT MD VISIT: 08/28/24  OBJECTIVE:  Note: Objective measures were completed at Evaluation unless otherwise noted.  DIAGNOSTIC FINDINGS: X-rays pending   PATIENT SURVEYS:  Patient-specific activity scoring scheme (Point to one number):  0 represents "unable to perform." 10 represents "able to perform at prior level. 0 1 2 3 4 5 6 7 8 9  10 (Date and Score) Activity Initial  Activity Eval   08/08/24  Walking uphill   5  5  Carrying items   5  8  Sleep  4 8   Additional Additional Total score = sum of the activity scores/number of activities Minimum detectable change (90%CI) for average score = 2 points Minimum detectable change (90%CI) for single activity score = 3 points PSFS developed by: Rosalee MYRTIS Marvis KYM Charlet CHRISTELLA., & Binkley, J. (1995). Assessing disability and change on individual  patients: a report of a patient specific measure. Physiotherapy [place], 47, [phone]. Reproduced with the permission of the authors  Score: 4.6   COGNITION: Overall cognitive status: Within functional limits for tasks assessed     SENSATION: Not tested  EDEMA:  No obvious swelling about the knees   MUSCLE LENGTH: Hamstrings: Right lacking 40 deg; Left lacking 35 deg   POSTURE: rounded shoulders and forward head, tibial bowing      LOWER EXTREMITY ROM:  Active ROM Right eval Left eval Right 07/31/24 Left 07/31/24  Hip flexion      Hip extension      Hip abduction      Hip adduction      Hip internal rotation      Hip external rotation      Knee flexion 115 115 123 120  Knee extension WNL WNL    Ankle dorsiflexion      Ankle plantarflexion      Ankle inversion      Ankle eversion       (Blank rows = not tested)  LOWER EXTREMITY MMT:  MMT Right eval Left eval Right 08/23/24 Left 08/23/24  Hip flexion 4+ 5 5 4+  Hip extension 4- 4- 4+ 4+  Hip abduction 4- 4- 5 5  Hip adduction      Hip internal rotation      Hip external rotation      Knee flexion 4+ 5 5 5   Knee extension 4+ 5 5 5   Ankle dorsiflexion      Ankle plantarflexion      Ankle inversion  Ankle eversion       (Blank rows = not tested) CERVICAL ROM:   Active ROM A/PROM (deg) eval 08/07/24  Flexion 40   Extension 20   Right lateral flexion    Left lateral flexion    Right rotation 45 55  Left rotation 35 50   (Blank rows = not tested) UPPER EXTREMITY MMT:  MMT Right eval Left eval Rt / Lt 08/23/24  Shoulder flexion 5 upper trap compensation 5 upper trap compensation   Shoulder extension     Shoulder abduction 5 5   Shoulder adduction     Shoulder extension     Shoulder internal rotation 5 5   Shoulder external rotation 5 5   Middle trapezius 4- 4- 4 / 4  Lower trapezius     Elbow flexion     Elbow extension     Wrist flexion     Wrist extension     Wrist ulnar deviation     Wrist radial deviation     Wrist pronation     Wrist supination     Grip strength      (Blank rows = not tested)  SPECIAL TESTS: (+) Ely's bilaterally   FUNCTIONAL TESTS:  Not tested   GAIT: Distance walked: 20 ft  Assistive device utilized: None Level of assistance: Complete Independence Comments: decreased Rt arm swing   [facility] Adult PT Treatment:                                                DATE: 08/28/2024 Therapeutic  Exercise: NuStep L6 x 5 min Seated red PB trunk flexion stretch Neuromuscular re-ed: Shoulder horizontal abduction + green TB (reverse fly) x15 Rows + hold with blue TB 15x3 Lower trap setting (arm slides at wall) Overhead Y pulls + green TB 2x10 Runner's step up Heel tap down 4 step Therapeutic Activity: Wall push-ups Push-ups on table Quadruped: Alternating shoulder taps Unilateral arm raises + yellow TB anchored underneath opp knee    [facility] Adult PT Treatment:                                                DATE: 08/23/2024 Therapeutic Exercise: NuStep L7 x 5 min Standing (Rt) quad stretch Side lunge adductor stretch at counter Supine adductor stretch (butterfly) 2x30  Hip & middle trap MMT (see above) Neuromuscular re-ed: Seated LAQ + black TB anchored around table leg 3x10 Standing:  Resisted shoulder horizontal abduction in lunge + green TB x15 Resisted W arms + green TB x 15 Resisted shoulder abduction + green TB x15 (bil) Therapeutic Activity: Lateral heel taps down 4 --> 6 step (Lt) Front heel tap down 4 step (Lt) Ascend/descend long stairway    [facility] Adult PT Treatment:                                                DATE: 08/22/24 Therapeutic Exercise: Seated HS stretch Seated hip flexor stretch  Neuromuscular re-ed: Standing Hip abd blue TB 2 x 10 Hip ext blue TB 2 x 10 HS curl blue TB 2  x 10 SL heel raise 2 x 10 Lunge with cuing for technique x 8 bilat Therapeutic Activity: Nustep L7 x 5 min for LE strength and warm up Stair negotiation without handrail x 2 flights without increase in pain Walking outdoors on incline without increase in pain! Pt does c/o tightness with walking uphill Step ups 8'' with 10# farmers carry Straddle step ups 8'' holding 10# KB Strong band suitcase carry 10# 4 x 40' fwd, 4 x 40' bkwd Sled push/pull 0#-->50#   [facility] Adult PT Treatment:                                                DATE: 08/15/24 Neuromuscular  re-ed: 15# front carry + high march 4x61ft  Strong band suitcase carry 10# 5x11ft BIL  Reactive stop/go 6 laps (3 CW/3CCW) for deceleration work   Therapeutic Activity: Nu step LE/UE L6  Farmer's carry 10# BIL 3 laps around gym Farmer's carry retro walk 4 x104ft 10# BIL cues for posture  Fwd lunge 2x8 BIL w/ UE support PRN; cues for comfortable ROM and mechanics                                                                                                                               PATIENT EDUCATION:  Education details: Updated HEP Person educated: Patient Education method: Explanation, Demonstration, Tactile cues, Verbal cues, and Handouts Education comprehension: verbalized understanding, returned demonstration, verbal cues required, tactile cues required, and needs further education  HOME EXERCISE PROGRAM: Access Code: [email or web address] URL: [email or web address] Date: 08/28/2024 Prepared by: Lamarr Price  Exercises - Gastroc Stretch on Wall  - 1 x daily - 7 x weekly - 3 sets - 30 sec  hold - Seated Hamstring Stretch  - 1 x daily - 7 x weekly - 3 sets - 30 sec  hold - Seated Cervical Rotation AROM  - 1 x daily - 7 x weekly - 1 sets - 10 reps - 10 seconds hold - Supine Cervical Retraction with Towel  - 1 x daily - 7 x weekly - 2 sets - 10 reps - Squat with Counter Support  - 1 x daily - 7 x weekly - 2 sets - 10 reps - Standing Heel Raise  - 1 x daily - 7 x weekly - 2 sets - 10 reps - Forward Step Down Touch with Heel  - 1 x daily - 7 x weekly - 3 sets - 10 reps - Runner's Step Up/Down  - 1 x daily - 7 x weekly - 3 sets - 10 reps - Standing Shoulder Horizontal Abduction with Resistance  - 1 x daily - 7 x weekly - 3 sets - 10 reps - Push Up on Table  - 1 x daily - 7 x weekly - 3 sets - 10  reps  ASSESSMENT:  CLINICAL IMPRESSION:  Progressed postural strengthening with focus on middle trapezius activation. Patient continues to demonstrate greater weakness on Rt UE  with middle trap MMT as compared to Lt. Patient able to complete all exercises with no exacerbation of pain.     OBJECTIVE IMPAIRMENTS: Abnormal gait, decreased activity tolerance, decreased endurance, decreased knowledge of condition, difficulty walking, decreased ROM, decreased strength, impaired flexibility, impaired UE functional use, improper body mechanics, postural dysfunction, and pain.     GOALS: Goals reviewed with patient? Yes  SHORT TERM GOALS: Target date: 08/15/2024  Patient will be independent and compliant with initial HEP.  Baseline: initial HEP issued Goal status: MET  2.  Patient will demonstrate at least 50 degrees of cervical rotation AROM to improve ability to scan environment when walking.  Baseline: see above 08/07/24: (Rt) 55, (Lt) 50  Goal status: MET  3.  Patient will demonstrate at least 120 degrees of bilateral knee flexion AROM to improve ease of transfers.  Baseline: see above  07/31/24: 120 (bil) Goal status: MET   LONG TERM GOALS: Target date: 09/16/24  Patient will improve average PSFS score by at least 2 points to signify clinically meaningful improvement in functional abilities.  Baseline: see above Goal status: MET 8/19  2.  Patient will report at least a 50% improvement in neck/knee pain to reduce current functional limitations.  Baseline: 0% improvement 08/23/24: 100% neck, 75% knee improvement Goal status: MET  3.  Patient will demonstrate at least 4+/5 bilateral hip abductor/extensor strength to improve stability about the chain with walking activity.  Baseline: see above  08/23/24: see above Goal status: MET  4.  Patient will demonstrate at least 4+/5 bilateral middle trap strength to improve postural stability.  Baseline: see above 08/23/24: 4/5 Goal status: IN PROGRESS  5.  Patient will be independent with advanced home program to progress/maintain current level of function.  Baseline: initial HEP issued  Goal status:  MET    PLAN:  PT FREQUENCY: 2x/week  PT DURATION: 8 weeks  PLANNED INTERVENTIONS: 97164- PT Re-evaluation, 97750- Physical Performance Testing, 97110-Therapeutic exercises, 97530- Therapeutic activity, [id number]- Neuromuscular re-education, 97535- Self Care, [id number]- Manual therapy, [id number]- Gait training, [phone] (1-2 muscles), 20561 (3+ muscles)- Dry Needling, Cryotherapy, and Moist heat  PLAN FOR NEXT SESSION: Middle trap strength; finalize HEP; functional knee strengthening. Discharge?   Lamarr Price, PTA 08/28/24, 4:13 PM

## 2024-08-28 NOTE — Patient Instructions (Addendum)
 Keep up the good work!  Continue on medications.  Scheduled eye exam.

## 2024-08-28 NOTE — Progress Notes (Unsigned)
 Established Patient Office Visit  Subjective   Patient ID: Brandon Mckee, male    DOB: 06-03-1966  Age: 58 y.o. MRN: [id number]  Chief Complaint  Patient presents with   Medical Management of Chronic Issues    HPI Brandon Mckee is a 58 y/o male presenting today for follow up on T2DM, HTN, HLD and OA bilateral knees.   He reports occasionally checking his blood sugar at home, but he can't remember the values. He is compliant on Metformin  and Mounjaro .  He denies any symptoms of hyperglycemia and denies numbness or tingling in his hands and feet. he denies any hypoglycemia symptoms. He is walking more and has lost a few pounds. He has not changed up his diet very much. He denies any CP, palpitations, headaches, or vision changes.   He is in PT for his bilateral knee arthritis. He feels like PT is helping along with mobic . He has no side effects to mobic . He was seeing DR. T in our office before he left. He will need referral for follow up.   ROS See HPI>    Objective:     BP 131/70   Pulse 90   Ht 5' 10 (1.778 m)   Wt 240 lb (108.9 kg)   SpO2 99%   BMI 34.44 kg/m  BP Readings from Last 3 Encounters:  08/28/24 131/70  07/24/24 (!) 143/67  07/10/24 (!) 141/76   Wt Readings from Last 3 Encounters:  08/28/24 240 lb (108.9 kg)  07/24/24 246 lb (111.6 kg)  07/10/24 243 lb 8 oz (110.5 kg)      Physical Exam Constitutional:      Appearance: Normal appearance. He is obese.  HENT:     Head: Normocephalic.  Cardiovascular:     Rate and Rhythm: Normal rate and regular rhythm.     Heart sounds: Normal heart sounds.  Pulmonary:     Effort: Pulmonary effort is normal.     Breath sounds: Normal breath sounds.  Musculoskeletal:     Right lower leg: No edema.     Left lower leg: No edema.  Neurological:     General: No focal deficit present.     Mental Status: He is alert and oriented to person, place, and time.  Psychiatric:        Mood and Affect: Mood normal.        Lab Results  Component Value Date   HGBA1C 5.9 (A) 08/29/2024      Assessment & Plan:  SABRASABRAD[facility]no was seen today for medical management of chronic issues.  Diagnoses and all orders for this visit:  Type 2 diabetes mellitus with other specified complication, without long-term current use of insulin (HCC) -     POCT HgB A1C -     metFORMIN  (GLUCOPHAGE ) 1000 MG tablet; TAKE 1 TABLET (1,000 MG TOTAL) BY MOUTH TWICE A DAY WITH FOOD -     tirzepatide  (MOUNJARO ) 7.5 MG/0.5ML Pen; Inject 7.5 mg into the skin once a week. -     Lipid panel -     CMP14+EGFR -     CBC w/Diff/Platelet  Essential hypertension -     amLODipine  (NORVASC ) 10 MG tablet; Take 1 tablet (10 mg total) by mouth daily. -     hydrALAZINE  (APRESOLINE ) 50 MG tablet; Take 1 tablet (50 mg total) by mouth 3 (three) times daily. -     valsartan -hydrochlorothiazide  (DIOVAN -HCT) 320-25 MG tablet; Take 1 tablet by mouth daily. -     CMP14+EGFR  Primary osteoarthritis of both knees -     Ambulatory referral to Sports Medicine  Hyperlipidemia LDL goal <70 -     Lipid panel  Immunization due -     Pneumococcal conjugate vaccine 20-valent (Prevnar 20) -     Flu vaccine trivalent PF, 6mos and older(Flulaval,Afluria,Fluarix,Fluzone)   A1C to goal today and much improved at 5.9 Continue on same medication regimen refilled today: mounjaro /metformin  Encouraged to continue DM diet and regular exercise BP looks much better and to goal, on ARB Pt could not get urine today for microalbumin On statin Foot exam UTD Needs to schedule eye exam Declined covid booster Pneumonia and flu shot given today Follow up in 3 months  Pt is doing better with pain from OA in knees Continue with mobic  and PT for now Continue working on weight loss which could help with knee pain Will make referral to Dr. Boyd for OA management    Return in about 3 months (around 11/27/2024).    Buna Cuppett, PA-C

## 2024-08-29 ENCOUNTER — Encounter: Payer: Self-pay | Admitting: Physical Therapy

## 2024-08-29 ENCOUNTER — Encounter: Payer: Self-pay | Admitting: Physician Assistant

## 2024-08-29 ENCOUNTER — Ambulatory Visit: Admitting: Physical Therapy

## 2024-08-29 DIAGNOSIS — M25561 Pain in right knee: Secondary | ICD-10-CM | POA: Diagnosis not present

## 2024-08-29 DIAGNOSIS — G8929 Other chronic pain: Secondary | ICD-10-CM

## 2024-08-29 DIAGNOSIS — M542 Cervicalgia: Secondary | ICD-10-CM

## 2024-08-29 DIAGNOSIS — M6281 Muscle weakness (generalized): Secondary | ICD-10-CM

## 2024-08-29 LAB — POCT GLYCOSYLATED HEMOGLOBIN (HGB A1C): Hemoglobin A1C: 5.9 % — AB (ref 4.0–5.6)

## 2024-08-29 LAB — CMP14+EGFR
ALT: 31 IU/L (ref 0–44)
AST: 23 IU/L (ref 0–40)
Albumin: 4.8 g/dL (ref 3.8–4.9)
Alkaline Phosphatase: 100 IU/L (ref 44–121)
BUN/Creatinine Ratio: 16 (ref 9–20)
BUN: 17 mg/dL (ref 6–24)
Bilirubin Total: 0.7 mg/dL (ref 0.0–1.2)
CO2: 22 mmol/L (ref 20–29)
Calcium: 9.7 mg/dL (ref 8.7–10.2)
Chloride: 101 mmol/L (ref 96–106)
Creatinine, Ser: 1.09 mg/dL (ref 0.76–1.27)
Globulin, Total: 2.3 g/dL (ref 1.5–4.5)
Glucose: 126 mg/dL — ABNORMAL HIGH (ref 70–99)
Potassium: 3.3 mmol/L — ABNORMAL LOW (ref 3.5–5.2)
Sodium: 140 mmol/L (ref 134–144)
Total Protein: 7.1 g/dL (ref 6.0–8.5)
eGFR: 79 mL/min/1.73 (ref >59)

## 2024-08-29 LAB — CBC WITH DIFFERENTIAL/PLATELET
Basophils Absolute: 0 x10E3/uL (ref 0.0–0.2)
Basos: 1 %
EOS (ABSOLUTE): 0.1 x10E3/uL (ref 0.0–0.4)
Eos: 1 %
Hematocrit: 39.3 % (ref 37.5–51.0)
Hemoglobin: 13.2 g/dL (ref 13.0–17.7)
Immature Grans (Abs): 0 x10E3/uL (ref 0.0–0.1)
Immature Granulocytes: 0 %
Lymphocytes Absolute: 1.3 x10E3/uL (ref 0.7–3.1)
Lymphs: 18 %
MCH: 29.7 pg (ref 26.6–33.0)
MCHC: 33.6 g/dL (ref 31.5–35.7)
MCV: 89 fL (ref 79–97)
Monocytes Absolute: 0.5 x10E3/uL (ref 0.1–0.9)
Monocytes: 7 %
Neutrophils Absolute: 5.4 x10E3/uL (ref 1.4–7.0)
Neutrophils: 73 %
Platelets: 129 x10E3/uL — ABNORMAL LOW (ref 150–450)
RBC: 4.44 x10E6/uL (ref 4.14–5.80)
RDW: 13.5 % (ref 11.6–15.4)
WBC: 7.4 x10E3/uL (ref 3.4–10.8)

## 2024-08-29 LAB — LIPID PANEL
Chol/HDL Ratio: 3.7 ratio (ref 0.0–5.0)
Cholesterol, Total: 97 mg/dL — ABNORMAL LOW (ref 100–199)
HDL: 26 mg/dL — ABNORMAL LOW (ref >39)
LDL Chol Calc (NIH): 38 mg/dL (ref 0–99)
Triglycerides: 205 mg/dL — ABNORMAL HIGH (ref 0–149)
VLDL Cholesterol Cal: 33 mg/dL (ref 5–40)

## 2024-08-29 NOTE — Therapy (Signed)
 [facility] TREAT[facility] DISCHARGE   Patient Name: Brandon Mckee MRN: [id number] DOB:07-19-1966, 58 y.o., male Today's Date: 08/29/2024  END OF SESSION:  PT End of Session - 08/29/24 1558     Visit Number 13    Number of Visits 17    Date for PT Re-Evaluation 09/16/24    Authorization Type [employer] MCD    Authorization - Visit Number 13    Authorization - Number of Visits 27    PT Start Time 1530    PT Stop Time 1559    PT Time Calculation (min) 29 min    Activity Tolerance Patient tolerated treatment well    Behavior During Therapy WFL for tasks assessed/performed          Past Medical History:  Diagnosis Date   Hyperlipidemia    Hypertension    Metabolic syndrome    IFG   Pes anserine bursitis    bilateral   Past Surgical History:  Procedure Laterality Date   APPENDECTOMY     Patient Active Problem List   Diagnosis Date Noted   Cervical spondylosis 07/17/2024   History of CVA (cerebrovascular accident) 12/06/2023   Thrombocytopenia (HCC) 12/06/2023   Hypokalemia 12/06/2023   Uncontrolled type 2 diabetes mellitus with hyperglycemia (HCC) 12/06/2023   Poorly-controlled hypertension 12/06/2023   Type 2 diabetes mellitus with hyperglycemia, without long-term current use of insulin (HCC) 12/06/2023   Hemiparesis of right dominant side due to acute cerebrovascular disease (HCC) 12/06/2023   Allergic reaction 07/16/2020   Gastroesophageal reflux disease with esophagitis 05/13/2018   Chronic gout of multiple sites 05/13/2018   Obesity (BMI 30-39.9) 09/10/2017   Grief reaction 08/09/2017   Benign prostatic hyperplasia without lower urinary tract symptoms 04/25/2017   Colon polyps 01/01/2017   Diverticulosis of colon without hemorrhage 01/01/2017   Trapezius muscle spasm 11/24/2016   Hypogonadism in male 07/03/2016   Microalbuminuria 04/01/2016   Osteoarthritis of right acromioclavicular joint 01/17/2016   Dyslipidemia 01/07/2016   Vitamin D   deficiency 01/07/2016   Depression 01/03/2016   Anxiety disorder 01/03/2016   Essential hypertension 01/03/2016   Pre-diabetes 12/07/2014   Male hypogonadism 05/25/2014   Obstructive sleep apnea 10/11/2013   B12 DEFICIENCY 06/30/2010   LEG EDEMA 05/03/2009   Primary osteoarthritis of both knees 12/27/2008   FATIGUE 09/25/2008   DYSMETABOLIC SYNDROME 08/04/2007   Hyperlipidemia LDL goal <70 12/08/2006   Class 2 severe obesity due to excess calories with serious comorbidity and body mass index (BMI) of 35.0 to 35.9 in adult (HCC) 12/08/2006    PCP: Antoniette Vermell CROME, PA-C  REFERRING PROVIDER: Curtis Debby PARAS, MD   REFERRING DIAG: M17.0 (ICD-10-CM) - Primary osteoarthritis of both knees; cervical spondylosis   THERAPY DIAG:  Chronic pain of both knees  Cervicalgia  Muscle weakness (generalized)  Rationale for Evaluation and Treatment: Rehabilitation  ONSET DATE: chronic   SUBJECTIVE:   SUBJECTIVE STATEMENT: Pt states his knees are feeling great. He is ready for d/c to HEP  PERTINENT HISTORY: CVA with Rt-sided hemiparesis  Obesity   Patient reports since 2008 he has been receiving knee injections for his pain. Then due to lack of insurance he went a few years without injections. Latest injection was in 2018. He feels that his knee pain is worsening and he can't even walk up hills. No popping/clicking in the knees. No instability. The pain is along the medial aspect of bilateral knees.   Patient reports off/on neck pain, but more recently the pain has  been more constant since CVA in December 2024. He reports the pain has been ongoing for at least a year. He attributes his pain to repetitive use of his upper body/previous work activity. He denies any numbness/tingling. No radicular symptoms.   PAIN:  Are you having pain? Yes: NPRS scale: 0 currently, at worst 7 (KNEES); 0 currently, at worst 4 (NECK) Pain location: bilateral medial knees and posterior neck Pain  description: throbbing (knees); tight (neck) Aggravating factors: walking up hills, carrying items(knees); neck movement, sleep (neck) Relieving factors: topical analgesic   PRECAUTIONS: None  RED FLAGS: None   WEIGHT BEARING RESTRICTIONS: No  FALLS:  Has patient fallen in last 6 months? No  LIVING ENVIRONMENT: Lives with: lives with their daughter Lives in: House/apartment Stairs: No Has following equipment at home: [name] - 2 wheeled, shower chair, and bed side commode  OCCUPATION: part time- works at [employer]  PLOF: Independent  PATIENT GOALS: relieve a little bit of the pain.   NEXT MD VISIT: 08/28/24  OBJECTIVE:  Note: Objective measures were completed at Evaluation unless otherwise noted.  DIAGNOSTIC FINDINGS: X-rays pending   PATIENT SURVEYS:  Patient-specific activity scoring scheme (Point to one number):  0 represents "unable to perform." 10 represents "able to perform at prior level. 0 1 2 3 4 5 6 7 8 9  10 (Date and Score) Activity Initial  Activity Eval   08/08/24  Walking uphill   5  5  Carrying items   5  8  Sleep  4 8   Additional Additional Total score = sum of the activity scores/number of activities Minimum detectable change (90%CI) for average score = 2 points Minimum detectable change (90%CI) for single activity score = 3 points PSFS developed by: Rosalee MYRTIS Marvis KYM Charlet CHRISTELLA., & Binkley, J. (1995). Assessing disability and change on individual  patients: a report of a patient specific measure. Physiotherapy [place], 47, [phone]. Reproduced with the permission of the authors  Score: 4.6   COGNITION: Overall cognitive status: Within functional limits for tasks assessed     SENSATION: Not tested  EDEMA:  No obvious swelling about the knees   MUSCLE LENGTH: Hamstrings: Right lacking 40 deg; Left lacking 35 deg   POSTURE: rounded shoulders and forward head, tibial bowing     LOWER EXTREMITY  ROM:  Active ROM Right eval Left eval Right 07/31/24 Left 07/31/24  Hip flexion      Hip extension      Hip abduction      Hip adduction      Hip internal rotation      Hip external rotation      Knee flexion 115 115 123 120  Knee extension WNL WNL    Ankle dorsiflexion      Ankle plantarflexion      Ankle inversion      Ankle eversion       (Blank rows = not tested)  LOWER EXTREMITY MMT:  MMT Right eval Left eval Right 08/23/24 Left 08/23/24  Hip flexion 4+ 5 5 4+  Hip extension 4- 4- 4+ 4+  Hip abduction 4- 4- 5 5  Hip adduction      Hip internal rotation      Hip external rotation      Knee flexion 4+ 5 5 5   Knee extension 4+ 5 5 5   Ankle dorsiflexion      Ankle plantarflexion      Ankle inversion      Ankle eversion       (  Blank rows = not tested) CERVICAL ROM:   Active ROM A/PROM (deg) eval 08/07/24  Flexion 40   Extension 20   Right lateral flexion    Left lateral flexion    Right rotation 45 55  Left rotation 35 50   (Blank rows = not tested) UPPER EXTREMITY MMT:  MMT Right eval Left eval Rt / Lt 08/23/24  Shoulder flexion 5 upper trap compensation 5 upper trap compensation   Shoulder extension     Shoulder abduction 5 5   Shoulder adduction     Shoulder extension     Shoulder internal rotation 5 5   Shoulder external rotation 5 5   Middle trapezius 4- 4- 4 / 4  Lower trapezius     Elbow flexion     Elbow extension     Wrist flexion     Wrist extension     Wrist ulnar deviation     Wrist radial deviation     Wrist pronation     Wrist supination     Grip strength      (Blank rows = not tested)  SPECIAL TESTS: (+) Ely's bilaterally   FUNCTIONAL TESTS:  Not tested   GAIT: Distance walked: 20 ft  Assistive device utilized: None Level of assistance: Complete Independence Comments: decreased Rt arm swing   [facility] Adult PT Treatment:                                                DATE: 08/29/24 Therapeutic Exercise/Activity: Runners step  up 6'' 2 x 10 Tap down 6'' step 2 x 10 Heel raise x 30 Shoulder horizontal abd green TB x 20 Gastroc stretch 2 x 20 bilat Seated HS stretch 2 x 20 bilat Review finalized HEP and recommendations for discharge   [facility] Adult PT Treatment:                                                DATE: 08/28/2024 Therapeutic Exercise: NuStep L6 x 5 min Seated red PB trunk flexion stretch Neuromuscular re-ed: Shoulder horizontal abduction + green TB (reverse fly) x15 Rows + hold with blue TB 15x3 Lower trap setting (arm slides at wall) Overhead Y pulls + green TB 2x10 Runner's step up Heel tap down 4 step Therapeutic Activity: Wall push-ups Push-ups on table Quadruped: Alternating shoulder taps Unilateral arm raises + yellow TB anchored underneath opp knee    [facility] Adult PT Treatment:                                                DATE: 08/23/2024 Therapeutic Exercise: NuStep L7 x 5 min Standing (Rt) quad stretch Side lunge adductor stretch at counter Supine adductor stretch (butterfly) 2x30  Hip & middle trap MMT (see above) Neuromuscular re-ed: Seated LAQ + black TB anchored around table leg 3x10 Standing:  Resisted shoulder horizontal abduction in lunge + green TB x15 Resisted W arms + green TB x 15 Resisted shoulder abduction + green TB x15 (bil) Therapeutic Activity: Lateral heel taps down 4 --> 6 step (Lt) Front heel tap down  4 step (Lt) Ascend/descend long stairway                                                                                                                            PATIENT EDUCATION:  Education details: Updated HEP Person educated: Patient Education method: Explanation, Demonstration, Tactile cues, Verbal cues, and Handouts Education comprehension: verbalized understanding, returned demonstration, verbal cues required, tactile cues required, and needs further education  HOME EXERCISE PROGRAM: Access Code: [email or web address] URL:  [email or web address] Date: 08/29/2024 Prepared by: Darice Conine  Exercises - Gastroc Stretch on Wall  - 1 x daily - 7 x weekly - 3 sets - 30 sec  hold - Seated Hamstring Stretch  - 1 x daily - 7 x weekly - 3 sets - 30 sec  hold - Standing Heel Raise  - 1 x daily - 7 x weekly - 3 sets - 10 reps - Forward Step Down Touch with Heel  - 1 x daily - 7 x weekly - 3 sets - 10 reps - Runner's Step Up/Down  - 1 x daily - 7 x weekly - 3 sets - 10 reps - Standing Shoulder Horizontal Abduction with Resistance  - 1 x daily - 7 x weekly - 3 sets - 10 reps - Push Up on Table  - 1 x daily - 7 x weekly - 3 sets - 10 reps  ASSESSMENT:  CLINICAL IMPRESSION:  Pt has improved strength and decreased pain. He has improved functional mobility. Pt has met all goals and is independent with HEP. He is ready for d/c to HEP   OBJECTIVE IMPAIRMENTS: Abnormal gait, decreased activity tolerance, decreased endurance, decreased knowledge of condition, difficulty walking, decreased ROM, decreased strength, impaired flexibility, impaired UE functional use, improper body mechanics, postural dysfunction, and pain.     GOALS: Goals reviewed with patient? Yes  SHORT TERM GOALS: Target date: 08/15/2024  Patient will be independent and compliant with initial HEP.  Baseline: initial HEP issued Goal status: MET  2.  Patient will demonstrate at least 50 degrees of cervical rotation AROM to improve ability to scan environment when walking.  Baseline: see above 08/07/24: (Rt) 55, (Lt) 50  Goal status: MET  3.  Patient will demonstrate at least 120 degrees of bilateral knee flexion AROM to improve ease of transfers.  Baseline: see above  07/31/24: 120 (bil) Goal status: MET   LONG TERM GOALS: Target date: 09/16/24  Patient will improve average PSFS score by at least 2 points to signify clinically meaningful improvement in functional abilities.  Baseline: see above Goal status: MET 8/19  2.  Patient  will report at least a 50% improvement in neck/knee pain to reduce current functional limitations.  Baseline: 0% improvement 08/23/24: 100% neck, 75% knee improvement Goal status: MET  3.  Patient will demonstrate at least 4+/5 bilateral hip abductor/extensor strength to improve stability about the chain with walking activity.  Baseline: see above  08/23/24: see above Goal status: MET  4.  Patient will demonstrate at least 4+/5 bilateral middle trap strength to improve postural stability.  Baseline: see above 08/23/24: 4/5 Goal status: IN PROGRESS  5.  Patient will be independent with advanced home program to progress/maintain current level of function.  Baseline: initial HEP issued  Goal status: MET    PLAN:  PT FREQUENCY: 2x/week  PT DURATION: 8 weeks  PLANNED INTERVENTIONS: 97164- PT Re-evaluation, 97750- Physical Performance Testing, 97110-Therapeutic exercises, 97530- Therapeutic activity, [id number]- Neuromuscular re-education, 97535- Self Care, [id number]- Manual therapy, 97116- Gait training, [phone] (1-2 muscles), 20561 (3+ muscles)- Dry Needling, Cryotherapy, and Moist heat  PLAN FOR NEXT SESSION: d/c   Darice Conine, PT,DPT09/09/254:00 PM  PHYSICAL THERAPY DISCHARGE SUMMARY  Visits from Start of Care: 13  Current functional level related to goals / functional outcomes: Decreased pain, improved strength   Remaining deficits: See above   Education / Equipment: HEP   Patient agrees to discharge. Patient goals were met. Patient is being discharged due to being pleased with the current functional level.

## 2024-09-04 ENCOUNTER — Ambulatory Visit

## 2024-09-05 ENCOUNTER — Ambulatory Visit: Admitting: Physical Therapy

## 2024-09-11 ENCOUNTER — Ambulatory Visit

## 2024-09-11 ENCOUNTER — Ambulatory Visit (INDEPENDENT_AMBULATORY_CARE_PROVIDER_SITE_OTHER): Admitting: Sports Medicine

## 2024-09-11 VITALS — BP 144/76 | Ht 71.0 in | Wt 240.0 lb

## 2024-09-11 DIAGNOSIS — M170 Bilateral primary osteoarthritis of knee: Secondary | ICD-10-CM | POA: Diagnosis not present

## 2024-09-11 NOTE — Progress Notes (Signed)
 Patient ID: Brandon Mckee, male   DOB: [birth year], 58 y.o.   MRN: [id number]  Patient presents today for follow-up on bilateral knee osteoarthritis.  He recently completed physical therapy.  It was helpful.  His pain is noticeable primarily when walking uphill or upstairs.  He has had cortisone injections and gel injections in the past.  He currently has a prescription for meloxicam .  We discussed repeat injection today but he would like to wait on that for now.  He understands that we could consider repeat cortisone injections or gel injections in the future when needed.   This note was dictated using Dragon naturally speaking software and may contain errors in syntax, spelling, or content which have not been identified prior to signing this note.

## 2024-09-12 ENCOUNTER — Ambulatory Visit: Admitting: Physical Therapy

## 2024-09-18 ENCOUNTER — Ambulatory Visit

## 2024-09-19 ENCOUNTER — Ambulatory Visit: Admitting: Physical Therapy

## 2024-10-23 ENCOUNTER — Ambulatory Visit: Admitting: Physician Assistant

## 2024-11-27 ENCOUNTER — Ambulatory Visit: Admitting: Physician Assistant

## 2024-11-27 ENCOUNTER — Encounter: Payer: Self-pay | Admitting: Physician Assistant

## 2024-11-27 VITALS — BP 140/80 | HR 83 | Ht 70.0 in | Wt 250.0 lb

## 2024-11-27 DIAGNOSIS — E876 Hypokalemia: Secondary | ICD-10-CM | POA: Diagnosis not present

## 2024-11-27 DIAGNOSIS — I10 Essential (primary) hypertension: Secondary | ICD-10-CM | POA: Diagnosis not present

## 2024-11-27 DIAGNOSIS — Z8673 Personal history of transient ischemic attack (TIA), and cerebral infarction without residual deficits: Secondary | ICD-10-CM

## 2024-11-27 DIAGNOSIS — E1169 Type 2 diabetes mellitus with other specified complication: Secondary | ICD-10-CM | POA: Insufficient documentation

## 2024-11-27 DIAGNOSIS — G4733 Obstructive sleep apnea (adult) (pediatric): Secondary | ICD-10-CM

## 2024-11-27 DIAGNOSIS — E785 Hyperlipidemia, unspecified: Secondary | ICD-10-CM | POA: Diagnosis not present

## 2024-11-27 DIAGNOSIS — Z7984 Long term (current) use of oral hypoglycemic drugs: Secondary | ICD-10-CM | POA: Diagnosis not present

## 2024-11-27 LAB — POCT GLYCOSYLATED HEMOGLOBIN (HGB A1C): Hemoglobin A1C: 6.8 % — AB (ref 4.0–5.6)

## 2024-11-27 MED ORDER — TIRZEPATIDE 10 MG/0.5ML ~~LOC~~ SOAJ: 10.0000 mg | SUBCUTANEOUS | 0 refills | Status: AC

## 2024-11-27 NOTE — Patient Instructions (Addendum)
 Increased mounjaro  to 10mg  weekly with metformin .   Diabetes: Healthy Eating for Adults When you have diabetes, also called diabetes mellitus, it's important to have healthy eating habits. Your blood sugar (glucose) levels are greatly affected by what you eat and drink. You need to eat healthy foods in the right amounts, at about the same times each day. Doing this can help you: Manage your blood sugar. Lower your risk of heart disease. Improve your blood pressure. Reach or stay at a healthy weight. What can affect my meal plan? Every person with diabetes is different. And each person has different needs for a meal plan. Your health care provider may suggest that you work with an expert in healthy eating called a dietitian. They can help you make a meal plan that's best for you. How do carbohydrates affect me? Carbohydrates, also called carbs, affect your blood sugar level more than any other type of food. Eating carbs raises the amount of sugar in your blood. It's important to know how many carbs you can safely have in each meal. This is different for every person. Your dietitian can help you calculate how many carbs you should have at each meal and for each snack. How does alcohol affect me? Alcohol can cause a decrease in blood sugar (hypoglycemia), especially if you use insulin or take certain diabetes medicines by mouth. Hypoglycemia can be a life-threatening condition. Symptoms of hypoglycemia are similar to those of having too much alcohol. They include confusion, being sleepy, and feeling dizzy. Do not drink alcohol if: Your provider tells you not to drink. You're pregnant, may be pregnant, or plan to become pregnant. What are tips for following this plan? Reading food labels Start by checking the serving size on the Nutrition Facts label of packaged foods and drinks. The number of calories and the amount of carbs, fats, and other nutrients listed on the label are based on one serving of  the item. Many items contain more than one serving per package. Check the total grams (g) of carbs in one serving. Check the number of grams of saturated fats and trans fats in one serving. Choose foods that have a low amount or none of these fats. Check the number of milligrams (mg) of salt (sodium) in one serving. Most people should limit their total sodium intake to less than 2,300 mg per day. Always check the nutrition information of foods labeled as low-fat or nonfat. These foods may be higher in added sugar or refined carbs and should be avoided. Talk to your dietitian to identify your daily goals for nutrients listed on the label. Shopping Avoid buying canned, pre-made, or processed foods. These foods tend to be high in fat, sodium, and added sugar. Shop around the outside edge of the grocery store. This is where you'll most often find fresh fruits and vegetables, bulk grains, fresh meats, and fresh dairy products. Cooking Use low-heat cooking methods, such as baking, instead of high-heat methods like deep frying. Cook using healthy oils, such as olive, canola, or sunflower oil. Avoid cooking with butter, cream, or high-fat meats. Meal planning  Eat meals and snacks regularly. Try to eat them at the same times every day. Avoid going too long without eating. Eat foods that are high in fiber, such as fresh fruits, vegetables, beans, and whole grains. Eat 4-6 oz (112-168 g) of lean protein each day, such as lean meat, chicken, fish, eggs, or tofu. One ounce (oz) (28 g) of lean protein is equal to:  1 oz (28 g) of meat, chicken, or fish. 1 egg.  cup (62 g) of tofu. Eat some foods each day that contain healthy fats, such as avocado, nuts, seeds, and fish. What foods should I eat? Fruits Berries. Apples. Oranges. Peaches. Apricots. Plums. Grapes. Mangoes. Papayas. Pomegranates. Kiwi. Cherries. Vegetables Leafy greens, including lettuce, spinach, kale, chard, collard greens, mustard  greens, and cabbage. Beets. Cauliflower. Broccoli. Carrots. Green beans. Tomatoes. Peppers. Onions. Cucumbers. Brussels sprouts. Grains Whole grains, such as whole-wheat or whole-grain bread, crackers, tortillas, cereal, and pasta. Unsweetened oatmeal. Quinoa. Brown or wild rice. Meats and other proteins Seafood. Poultry without skin. Lean cuts of poultry and beef. Tofu. Nuts. Seeds. Dairy Low-fat or fat-free dairy products such as milk, yogurt, and cheese. The items listed above may not be all the foods and drinks you can have. Talk with a dietitian to learn more. What foods should I avoid? Fruits Fruits canned with syrup. Vegetables Canned vegetables. Frozen vegetables with butter or cream sauce. Grains Refined white flour and flour products such as bread, pasta, snack foods, and cereals. Avoid all processed foods. Meats and other proteins Fatty cuts of meat. Poultry with skin. Breaded or fried meats. Processed meat. Avoid saturated fats. Dairy Full-fat yogurt, cheese, or milk. Beverages Sweetened drinks, such as soda or iced tea. The items listed above may not be all the foods and drinks you should avoid. Talk with a dietitian to learn more. Where to find more information: To learn more, go to: Academy of Nutrition and Dietetics at deathprevention.it. Click Search and type diabetes. Find the link you need. Centers for Disease Control and Prevention at tonerpromos.no. Click Search and type diabetes. Find the link you need. American Diabetes Association: diabetes.org/food-nutrition [employer] of Diabetes and Digestive and Kidney Diseases: stagesync.si This information is not intended to replace advice given to you by your health care provider. Make sure you discuss any questions you have with your health care provider. Document Revised: 11/25/2023 Document Reviewed: 11/25/2023 Elsevier Patient Education  2025 [employer].

## 2024-11-27 NOTE — Progress Notes (Unsigned)
 Established Patient Office Visit  Subjective   Patient ID: GUALBERTO WAHLEN, male    DOB: [birth year]  Age: 58 y.o. MRN: [id number]  Chief Complaint  Patient presents with   Medical Management of Chronic Issues    HPI .SABRADiscussed the use of AI scribe software for clinical note transcription with the patient, who gave verbal consent to proceed.  History of Present Illness YONATHAN PERROW is a 57 year old male with diabetes and hypertension who presents for follow-up of his blood pressure and blood sugar control.  Glycemic control - Hemoglobin A1c is 6.8%, below target of 7%. - Recent increase in blood sugar attributed to desserts consumed during Thanksgiving. - Currently taking metformin  and Mounjaro  7.5 mg for diabetes management. - He is not checking his sugars - he denies any hypoglycemic symptoms - Denies any CP, palpitations, headaches or vision changes  Hypertension management - Blood pressure measured at 154 mmHg during visit; typically runs around 140 mmHg with medication. - Occasionally checks blood pressure at home. - Currently taking amlodipine , hydralazine , and Diovan  HCTZ for blood pressure control.  Cerebrovascular disease - One year since cerebrovascular accident (stroke). - Significant improvement in functional status compared to one year ago. - Remains physically active.  Cardiopulmonary symptoms - No chest pain. - No shortness of breath.  Hyperlipidemia management - Currently taking Crestor  for cholesterol control.  Immunization status - Received influenza vaccination at last visit.     ROS See HPI.    Objective:     BP (!) 140/80   Pulse 83   Ht 5' 10 (1.778 m)   Wt 250 lb (113.4 kg)   SpO2 99%   BMI 35.87 kg/m  BP Readings from Last 3 Encounters:  11/27/24 (!) 140/80  09/11/24 (!) 144/76  08/28/24 131/70   Wt Readings from Last 3 Encounters:  11/27/24 250 lb (113.4 kg)  09/11/24 240 lb (108.9 kg)  08/28/24 240 lb  (108.9 kg)  .SABRA Lab Results  Component Value Date   HGBA1C 6.8 (A) 11/27/2024   .SABRA Results for orders placed or performed in visit on 11/27/24  POCT HgB A1C   Collection Time: 11/27/24  9:28 AM  Result Value Ref Range   Hemoglobin A1C 6.8 (A) 4.0 - 5.6 %   HbA1c POC (<> result, manual entry)     HbA1c, POC (prediabetic range)     HbA1c, POC (controlled diabetic range)    BMP8+eGFR   Collection Time: 11/27/24  9:37 AM  Result Value Ref Range   Glucose 152 (H) 70 - 99 mg/dL   BUN 18 6 - 24 mg/dL   Creatinine, Ser 8.99 0.76 - 1.27 mg/dL   eGFR 87 >40 fO/fpw/8.26   BUN/Creatinine Ratio 18 9 - 20   Sodium 141 134 - 144 mmol/L   Potassium 3.6 3.5 - 5.2 mmol/L   Chloride 102 96 - 106 mmol/L   CO2 24 20 - 29 mmol/L   Calcium  9.8 8.7 - 10.2 mg/dL  POCT UA - Microalbumin   Collection Time: 11/28/24 11:05 AM  Result Value Ref Range   Microalbumin Ur, POC 30 mg/L   Creatinine, POC 300 mg/dL   Albumin/Creatinine Ratio, Urine, POC <30      Physical Exam Constitutional:      Appearance: Normal appearance.  HENT:     Head: Normocephalic.  Cardiovascular:     Rate and Rhythm: Normal rate and regular rhythm.     Heart sounds: Murmur heard.  Pulmonary:  Effort: Pulmonary effort is normal.     Breath sounds: Normal breath sounds.  Musculoskeletal:     Cervical back: Normal range of motion and neck supple.     Right lower leg: No edema.     Left lower leg: No edema.  Neurological:     General: No focal deficit present.     Mental Status: He is alert and oriented to person, place, and time.         Assessment & Plan:  SABRASABRAGerardo was seen today for medical management of chronic issues.  Diagnoses and all orders for this visit:  Hyperlipidemia associated with type 2 diabetes mellitus (HCC) -     POCT HgB A1C -     POCT UA - Microalbumin -     BMP8+eGFR -     tirzepatide  (MOUNJARO ) 10 MG/0.5ML Pen; Inject 10 mg into the skin once a week.  Essential hypertension -      BMP8+eGFR  Morbid obesity (HCC)  Hypokalemia -     BMP8+eGFR  History of CVA (cerebrovascular accident)  Obstructive sleep apnea -     tirzepatide  (MOUNJARO ) 10 MG/0.5ML Pen; Inject 10 mg into the skin once a week.   Assessment & Plan Type 2 diabetes mellitus associated with hyperlipidemia Blood sugar levels are pretty good with HbA1c at 6.8%. Goal to reduce HbA1c to 6.5%. - Increased Tirzepatide  (Mounjaro ) to 10 mg subcutaneous once a week. - Continue metformin .  - Encouraged weight loss and dietary vigilance. - Normal microalbumin - Need to schedule eye exam - Foot exam UTD - flu and pneumonia vaccine UTD  Essential hypertension Blood pressure elevated at 154 mmHg, home readings around 140 mmHg. - Rechecked blood pressure in office today with some improvement but not to goal - Goal BP under 130/80  Hyperlipidemia Continues on Crestor  for cholesterol management.  History of stroke One year post-stroke with significant improvement.  General Health Maintenance Received flu shot at last visit. - Schedule annual eye exam for diabetes management.       Return in about 3 months (around 02/25/2025).    Karson Reede, PA-C

## 2024-11-28 ENCOUNTER — Ambulatory Visit: Payer: Self-pay | Admitting: Physician Assistant

## 2024-11-28 LAB — POCT UA - MICROALBUMIN
Albumin/Creatinine Ratio, Urine, POC: <30
Creatinine, POC: 300 mg/dL
Microalbumin Ur, POC: 30 mg/L

## 2024-11-28 LAB — BMP8+EGFR
BUN/Creatinine Ratio: 18 (ref 9–20)
BUN: 18 mg/dL (ref 6–24)
CO2: 24 mmol/L (ref 20–29)
Calcium: 9.8 mg/dL (ref 8.7–10.2)
Chloride: 102 mmol/L (ref 96–106)
Creatinine, Ser: 1 mg/dL (ref 0.76–1.27)
Glucose: 152 mg/dL — ABNORMAL HIGH (ref 70–99)
Potassium: 3.6 mmol/L (ref 3.5–5.2)
Sodium: 141 mmol/L (ref 134–144)
eGFR: 87 mL/min/1.73 (ref >59)

## 2024-11-28 NOTE — Progress Notes (Signed)
 Brandon Mckee,   Labs look good.

## 2025-01-10 ENCOUNTER — Ambulatory Visit: Admitting: Family Medicine

## 2025-01-17 ENCOUNTER — Telehealth: Payer: Self-pay

## 2025-01-17 DIAGNOSIS — Z7189 Other specified counseling: Secondary | ICD-10-CM

## 2025-01-17 NOTE — Progress Notes (Signed)
 Complex Care Management Note  Care Guide Note 01/17/2025 Name: Brandon Mckee MRN: [id number] DOB: Nov 28, 1966  [name] is a 59 y.o. year old male who sees Liebenthal, Vermell CROME, PA-C for primary care. I reached out to [employer] Hogan by phone today to offer complex care management services.  [name] was given information about Complex Care Management services today including:   The Complex Care Management services include support from the care team which includes your Nurse Care Manager, Clinical Social Worker, or Pharmacist.  The Complex Care Management team is here to help remove barriers to the health concerns and goals most important to you. Complex Care Management services are voluntary, and the patient may decline or stop services at any time by request to their care team member.   Complex Care Management Consent Status: Patient did not agree to participate in complex care management services at this time.  Follow up plan:    Encounter Outcome:  Patient Refused  Jeoffrey Buffalo , RMA     [facility]  [facility], [facility] Guide  Direct Dial: [phone]  Website: [email or web address]

## 2025-02-26 ENCOUNTER — Ambulatory Visit: Admitting: Physician Assistant
